# Patient Record
Sex: Female | Born: 1955 | Race: Black or African American | Hispanic: No | Smoking: Current some day smoker
Health system: Southern US, Community
[De-identification: ages and names within clinical notes are randomized; demographics above are authoritative.]

## PROBLEM LIST (undated history)

## (undated) DIAGNOSIS — Z720 Tobacco use: Secondary | ICD-10-CM

## (undated) DIAGNOSIS — Z8742 Personal history of other diseases of the female genital tract: Secondary | ICD-10-CM

## (undated) DIAGNOSIS — N840 Polyp of corpus uteri: Secondary | ICD-10-CM

## (undated) DIAGNOSIS — R51 Headache: Secondary | ICD-10-CM

## (undated) DIAGNOSIS — M1990 Unspecified osteoarthritis, unspecified site: Secondary | ICD-10-CM

## (undated) DIAGNOSIS — K5790 Diverticulosis of intestine, part unspecified, without perforation or abscess without bleeding: Secondary | ICD-10-CM

## (undated) DIAGNOSIS — R011 Cardiac murmur, unspecified: Secondary | ICD-10-CM

## (undated) DIAGNOSIS — Z8719 Personal history of other diseases of the digestive system: Secondary | ICD-10-CM

## (undated) DIAGNOSIS — R0789 Other chest pain: Secondary | ICD-10-CM

## (undated) DIAGNOSIS — D649 Anemia, unspecified: Secondary | ICD-10-CM

## (undated) DIAGNOSIS — R519 Headache, unspecified: Secondary | ICD-10-CM

## (undated) DIAGNOSIS — I10 Essential (primary) hypertension: Secondary | ICD-10-CM

## (undated) DIAGNOSIS — D259 Leiomyoma of uterus, unspecified: Secondary | ICD-10-CM

## (undated) DIAGNOSIS — K219 Gastro-esophageal reflux disease without esophagitis: Secondary | ICD-10-CM

## (undated) DIAGNOSIS — Z201 Contact with and (suspected) exposure to tuberculosis: Secondary | ICD-10-CM

## (undated) HISTORY — PX: WRIST SURGERY: SHX841

## (undated) HISTORY — PX: CARDIOVASCULAR STRESS TEST: SHX262

## (undated) HISTORY — PX: COLONOSCOPY: SHX174

## (undated) HISTORY — PX: CYSTECTOMY: SUR359

## (undated) HISTORY — PX: DILATION AND CURETTAGE OF UTERUS: SHX78

---

## 1998-02-27 ENCOUNTER — Emergency Department (HOSPITAL_COMMUNITY): Admission: EM | Admit: 1998-02-27 | Discharge: 1998-02-27 | Payer: Self-pay | Admitting: Emergency Medicine

## 1998-03-06 ENCOUNTER — Emergency Department (HOSPITAL_COMMUNITY): Admission: EM | Admit: 1998-03-06 | Discharge: 1998-03-06 | Payer: Self-pay | Admitting: Emergency Medicine

## 1998-05-22 ENCOUNTER — Emergency Department (HOSPITAL_COMMUNITY): Admission: EM | Admit: 1998-05-22 | Discharge: 1998-05-22 | Payer: Self-pay | Admitting: Emergency Medicine

## 1998-05-23 ENCOUNTER — Emergency Department (HOSPITAL_COMMUNITY): Admission: EM | Admit: 1998-05-23 | Discharge: 1998-05-23 | Payer: Self-pay | Admitting: Emergency Medicine

## 1998-07-30 ENCOUNTER — Encounter: Payer: Self-pay | Admitting: Emergency Medicine

## 1998-07-30 ENCOUNTER — Emergency Department (HOSPITAL_COMMUNITY): Admission: EM | Admit: 1998-07-30 | Discharge: 1998-07-30 | Payer: Self-pay | Admitting: Emergency Medicine

## 1999-02-17 ENCOUNTER — Emergency Department (HOSPITAL_COMMUNITY): Admission: EM | Admit: 1999-02-17 | Discharge: 1999-02-17 | Payer: Self-pay | Admitting: Emergency Medicine

## 1999-02-19 ENCOUNTER — Inpatient Hospital Stay (HOSPITAL_COMMUNITY): Admission: EM | Admit: 1999-02-19 | Discharge: 1999-02-19 | Payer: Self-pay | Admitting: Obstetrics & Gynecology

## 1999-02-19 ENCOUNTER — Ambulatory Visit (HOSPITAL_COMMUNITY): Admission: RE | Admit: 1999-02-19 | Discharge: 1999-02-19 | Payer: Self-pay | Admitting: Emergency Medicine

## 1999-02-19 ENCOUNTER — Encounter: Payer: Self-pay | Admitting: Emergency Medicine

## 1999-04-10 ENCOUNTER — Emergency Department (HOSPITAL_COMMUNITY): Admission: EM | Admit: 1999-04-10 | Discharge: 1999-04-10 | Payer: Self-pay | Admitting: Internal Medicine

## 2001-09-02 ENCOUNTER — Encounter: Payer: Self-pay | Admitting: Internal Medicine

## 2001-09-02 ENCOUNTER — Ambulatory Visit (HOSPITAL_COMMUNITY): Admission: RE | Admit: 2001-09-02 | Discharge: 2001-09-02 | Payer: Self-pay | Admitting: Internal Medicine

## 2002-06-14 ENCOUNTER — Ambulatory Visit (HOSPITAL_COMMUNITY): Admission: RE | Admit: 2002-06-14 | Discharge: 2002-06-14 | Payer: Self-pay | Admitting: Internal Medicine

## 2002-12-03 ENCOUNTER — Encounter: Payer: Self-pay | Admitting: Emergency Medicine

## 2002-12-03 ENCOUNTER — Emergency Department (HOSPITAL_COMMUNITY): Admission: EM | Admit: 2002-12-03 | Discharge: 2002-12-03 | Payer: Self-pay | Admitting: Emergency Medicine

## 2003-06-24 ENCOUNTER — Emergency Department (HOSPITAL_COMMUNITY): Admission: EM | Admit: 2003-06-24 | Discharge: 2003-06-24 | Payer: Self-pay | Admitting: Emergency Medicine

## 2004-07-03 ENCOUNTER — Ambulatory Visit: Payer: Self-pay | Admitting: Nurse Practitioner

## 2004-07-04 ENCOUNTER — Ambulatory Visit (HOSPITAL_COMMUNITY): Admission: RE | Admit: 2004-07-04 | Discharge: 2004-07-04 | Payer: Self-pay | Admitting: Internal Medicine

## 2004-07-04 ENCOUNTER — Ambulatory Visit: Payer: Self-pay | Admitting: *Deleted

## 2004-08-06 ENCOUNTER — Ambulatory Visit: Payer: Self-pay | Admitting: Nurse Practitioner

## 2005-03-25 ENCOUNTER — Inpatient Hospital Stay (HOSPITAL_COMMUNITY): Admission: AD | Admit: 2005-03-25 | Discharge: 2005-03-25 | Payer: Self-pay | Admitting: Obstetrics and Gynecology

## 2005-07-10 ENCOUNTER — Emergency Department (HOSPITAL_COMMUNITY): Admission: EM | Admit: 2005-07-10 | Discharge: 2005-07-10 | Payer: Self-pay | Admitting: Emergency Medicine

## 2005-10-29 ENCOUNTER — Emergency Department (HOSPITAL_COMMUNITY): Admission: EM | Admit: 2005-10-29 | Discharge: 2005-10-29 | Payer: Self-pay | Admitting: Emergency Medicine

## 2007-03-14 ENCOUNTER — Emergency Department (HOSPITAL_COMMUNITY): Admission: EM | Admit: 2007-03-14 | Discharge: 2007-03-14 | Payer: Self-pay | Admitting: Emergency Medicine

## 2007-05-13 ENCOUNTER — Encounter (INDEPENDENT_AMBULATORY_CARE_PROVIDER_SITE_OTHER): Payer: Self-pay | Admitting: Nurse Practitioner

## 2007-05-13 ENCOUNTER — Ambulatory Visit: Payer: Self-pay | Admitting: Internal Medicine

## 2007-05-13 LAB — CONVERTED CEMR LAB
ALT: 8 units/L (ref 0–35)
BUN: 13 mg/dL (ref 6–23)
CK-MB: 1.5 ng/mL (ref 0.3–4.0)
CO2: 22 meq/L (ref 19–32)
Calcium: 8.9 mg/dL (ref 8.4–10.5)
Chloride: 105 meq/L (ref 96–112)
Creatinine, Ser: 0.74 mg/dL (ref 0.40–1.20)
Helicobacter Pylori Antibody-IgG: <0.4
Hemoglobin: 12.1 g/dL (ref 12.0–15.0)
Lymphocytes Relative: 37 % (ref 12–46)
Monocytes Absolute: 0.6 10*3/uL (ref 0.1–1.0)
Monocytes Relative: 7 % (ref 3–12)
Neutro Abs: 4.5 10*3/uL (ref 1.7–7.7)
RBC: 3.98 M/uL (ref 3.87–5.11)
Relative Index: 1.4 (ref 0.0–2.5)
Troponin I: <0.01 ng/mL (ref <0.06)

## 2007-06-23 ENCOUNTER — Encounter (INDEPENDENT_AMBULATORY_CARE_PROVIDER_SITE_OTHER): Payer: Self-pay | Admitting: Nurse Practitioner

## 2007-06-23 ENCOUNTER — Ambulatory Visit: Payer: Self-pay | Admitting: Internal Medicine

## 2007-06-23 LAB — CONVERTED CEMR LAB
ALT: <8 units/L (ref 0–35)
AST: 10 units/L (ref 0–37)
Alkaline Phosphatase: 70 units/L (ref 39–117)
Basophils Absolute: 0 10*3/uL (ref 0.0–0.1)
Basophils Relative: 0 % (ref 0–1)
Chloride: 106 meq/L (ref 96–112)
Creatinine, Ser: 0.7 mg/dL (ref 0.40–1.20)
Eosinophils Absolute: 0.4 10*3/uL (ref 0.0–0.7)
Eosinophils Relative: 3 % (ref 0–5)
Hemoglobin: 11.8 g/dL — ABNORMAL LOW (ref 12.0–15.0)
MCHC: 33.3 g/dL (ref 30.0–36.0)
MCV: 92.2 fL (ref 78.0–100.0)
Monocytes Absolute: 0.6 10*3/uL (ref 0.1–1.0)
Neutro Abs: 6.4 10*3/uL (ref 1.7–7.7)
RDW: 15.3 % (ref 11.5–15.5)
Total Bilirubin: 0.7 mg/dL (ref 0.3–1.2)

## 2007-10-08 ENCOUNTER — Ambulatory Visit: Payer: Self-pay | Admitting: Internal Medicine

## 2007-10-15 ENCOUNTER — Encounter: Admission: RE | Admit: 2007-10-15 | Discharge: 2007-11-05 | Payer: Self-pay | Admitting: Internal Medicine

## 2008-03-07 ENCOUNTER — Emergency Department (HOSPITAL_COMMUNITY): Admission: EM | Admit: 2008-03-07 | Discharge: 2008-03-07 | Payer: Self-pay | Admitting: Emergency Medicine

## 2008-10-17 ENCOUNTER — Ambulatory Visit: Payer: Self-pay | Admitting: Internal Medicine

## 2008-10-21 ENCOUNTER — Ambulatory Visit (HOSPITAL_COMMUNITY): Admission: RE | Admit: 2008-10-21 | Discharge: 2008-10-21 | Payer: Self-pay | Admitting: Family Medicine

## 2009-04-07 ENCOUNTER — Emergency Department (HOSPITAL_COMMUNITY): Admission: EM | Admit: 2009-04-07 | Discharge: 2009-04-07 | Payer: Self-pay | Admitting: Emergency Medicine

## 2009-04-14 ENCOUNTER — Ambulatory Visit: Payer: Self-pay | Admitting: Internal Medicine

## 2009-07-30 ENCOUNTER — Emergency Department (HOSPITAL_COMMUNITY): Admission: EM | Admit: 2009-07-30 | Discharge: 2009-07-30 | Payer: Self-pay | Admitting: Emergency Medicine

## 2009-09-27 ENCOUNTER — Ambulatory Visit: Payer: Self-pay | Admitting: Internal Medicine

## 2010-03-22 ENCOUNTER — Emergency Department (HOSPITAL_COMMUNITY)
Admission: EM | Admit: 2010-03-22 | Discharge: 2010-03-22 | Payer: Self-pay | Source: Home / Self Care | Admitting: Emergency Medicine

## 2011-01-27 ENCOUNTER — Emergency Department (HOSPITAL_COMMUNITY)
Admission: EM | Admit: 2011-01-27 | Discharge: 2011-01-27 | Disposition: A | Discharge status: Home / Self Care | Payer: Self-pay | Attending: Emergency Medicine | Admitting: Emergency Medicine

## 2011-01-27 ENCOUNTER — Emergency Department (HOSPITAL_COMMUNITY): Payer: Self-pay

## 2011-01-27 DIAGNOSIS — R059 Cough, unspecified: Secondary | ICD-10-CM | POA: Insufficient documentation

## 2011-01-27 DIAGNOSIS — IMO0001 Reserved for inherently not codable concepts without codable children: Secondary | ICD-10-CM | POA: Insufficient documentation

## 2011-01-27 DIAGNOSIS — R062 Wheezing: Secondary | ICD-10-CM | POA: Insufficient documentation

## 2011-01-27 DIAGNOSIS — J40 Bronchitis, not specified as acute or chronic: Secondary | ICD-10-CM | POA: Insufficient documentation

## 2011-01-27 DIAGNOSIS — R509 Fever, unspecified: Secondary | ICD-10-CM | POA: Insufficient documentation

## 2011-01-27 DIAGNOSIS — R05 Cough: Secondary | ICD-10-CM | POA: Insufficient documentation

## 2011-01-27 HISTORY — DX: Essential (primary) hypertension: I10

## 2011-01-27 MED ORDER — HYDROCOD POLST-CHLORPHEN POLST 10-8 MG/5ML PO LQCR: 5.0000 mL | Freq: Two times a day (BID) | ORAL | Status: DC

## 2011-01-27 MED ORDER — PREDNISONE 20 MG PO TABS: 40.0000 mg | ORAL_TABLET | Freq: Every day | ORAL | Status: AC

## 2011-01-27 MED ORDER — IPRATROPIUM BROMIDE 0.02 % IN SOLN
0.5000 mg | Freq: Once | RESPIRATORY_TRACT | Status: AC
  Administered 2011-01-27: 0.5 mg via RESPIRATORY_TRACT
  Filled 2011-01-27: qty 2.5

## 2011-01-27 MED ORDER — ALBUTEROL SULFATE (5 MG/ML) 0.5% IN NEBU
2.5000 mg | INHALATION_SOLUTION | Freq: Once | RESPIRATORY_TRACT | Status: AC
  Administered 2011-01-27: 5 mg via RESPIRATORY_TRACT
  Filled 2011-01-27: qty 1

## 2011-01-27 MED ORDER — IBUPROFEN 800 MG PO TABS
800.0000 mg | ORAL_TABLET | Freq: Once | ORAL | Status: AC
  Administered 2011-01-27: 800 mg via ORAL
  Filled 2011-01-27: qty 1

## 2011-01-27 MED ORDER — ALBUTEROL SULFATE HFA 108 (90 BASE) MCG/ACT IN AERS
2.0000 | INHALATION_SPRAY | Freq: Once | RESPIRATORY_TRACT | Status: AC
  Administered 2011-01-27: 2 via RESPIRATORY_TRACT
  Filled 2011-01-27: qty 6.7

## 2011-01-27 NOTE — ED Provider Notes (Signed)
History     CSN: [id number] Arrival date & time: 01/27/2011  2:59 PM   First MD Initiated Contact with Patient 01/27/11 1726      Chief Complaint  Patient presents with  . URI    pt in with productive cough and fever x1 week states at times noted blood in sputum pt states no relief with otc medication    (Consider location/radiation/quality/duration/timing/severity/associated sxs/prior treatment) Patient is a 55 y.o. female presenting with URI. The history is provided by the patient.  URI The primary symptoms include fever, sore throat, cough, wheezing and myalgias. Primary symptoms do not include ear pain or rash. The current episode started more than 1 week ago. This is a chronic problem. The problem has not changed since onset. Symptoms associated with the illness include chills, sinus pressure and congestion. The illness is not associated with facial pain. The following treatments were addressed: Acetaminophen was ineffective. A decongestant was ineffective.  Pt reports flu like symptoms for a week. States now cough worsening, feels like last time when she had pneumonia. States fever up to 102, only at bedtime.   Past Medical History  Diagnosis Date  . Hypertension     History reviewed. No pertinent past surgical history.  History reviewed. No pertinent family history.  History  Substance Use Topics  . Smoking status: Current Everyday Smoker  . Smokeless tobacco: Not on file  . Alcohol Use: Yes    OB History    Grav Para Term Preterm Abortions TAB SAB Ect Mult Living                  Review of Systems  Constitutional: Positive for fever and chills.  HENT: Positive for congestion, sore throat and sinus pressure. Negative for ear pain, neck pain and ear discharge.   Eyes: Negative.   Respiratory: Positive for cough and wheezing. Negative for chest tightness.   Cardiovascular: Negative for chest pain.  Gastrointestinal: Negative.   Genitourinary: Negative.     Musculoskeletal: Positive for myalgias.  Skin: Negative for rash.  Neurological: Negative.   Hematological: Negative.     Allergies  Review of patient's allergies indicates no known allergies.  Home Medications  No current outpatient prescriptions on file.  BP 125/81  Pulse 88  Temp(Src) 98.5 F (36.9 C) (Oral)  Resp 18  SpO2 99%  Physical Exam  Constitutional: She is oriented to person, place, and time. She appears well-developed and well-nourished. No distress.  HENT:  Head: Normocephalic.  Right Ear: Tympanic membrane, external ear and ear canal normal.  Left Ear: Tympanic membrane, external ear and ear canal normal.  Nose: Rhinorrhea present.  Mouth/Throat: Uvula is midline, oropharynx is clear and moist and mucous membranes are normal. No uvula swelling.  Cardiovascular: Normal rate, regular rhythm and normal heart sounds.   Pulmonary/Chest: Effort normal and breath sounds normal.       Expiratory wheezing in all lung fields  Abdominal: Soft. Bowel sounds are normal. There is no tenderness.  Musculoskeletal: Normal range of motion.  Neurological: She is alert and oriented to person, place, and time. She has normal reflexes.  Skin: Skin is warm and dry.    ED Course  Procedures (including critical care time) Dg Chest 2 View  01/27/2011  *RADIOLOGY REPORT*  Clinical Data: Fever.  Cough.  CHEST - 2 VIEW  Comparison: 04/07/2009  Findings: Heart size and vascularity are normal and the lungs are clear.  No osseous abnormality.  IMPRESSION: Normal chest.  Original Report  Authenticated By: Gwynn Burly, M.D.     Negative CXR. Pt received neb in ED, feels better. Will start on prednisone pack, inhaler proided, cough medications give. Pt's oxygen sat 99% on RA. Afebrile. Non toxic. Will d/c home.  MDM          Lottie Mussel, PA 01/27/11 2010

## 2011-01-28 NOTE — ED Provider Notes (Signed)
Medical screening examination/treatment/procedure(s) were performed by non-physician practitioner and as supervising physician I was immediately available for consultation/collaboration.   Tolulope Pinkett, MD 01/28/11 0038 

## 2011-05-09 ENCOUNTER — Other Ambulatory Visit: Payer: Self-pay | Admitting: Obstetrics and Gynecology

## 2011-05-09 DIAGNOSIS — Z1231 Encounter for screening mammogram for malignant neoplasm of breast: Secondary | ICD-10-CM

## 2011-05-14 ENCOUNTER — Other Ambulatory Visit: Payer: Self-pay | Admitting: Obstetrics and Gynecology

## 2011-05-14 ENCOUNTER — Ambulatory Visit (INDEPENDENT_AMBULATORY_CARE_PROVIDER_SITE_OTHER): Payer: Self-pay | Admitting: *Deleted

## 2011-05-14 ENCOUNTER — Ambulatory Visit (HOSPITAL_COMMUNITY): Payer: Self-pay | Attending: Obstetrics and Gynecology

## 2011-05-14 VITALS — BP 136/94 | HR 91 | Temp 98.6°F | Ht 62.0 in | Wt 129.7 lb

## 2011-05-14 DIAGNOSIS — Z01419 Encounter for gynecological examination (general) (routine) without abnormal findings: Secondary | ICD-10-CM

## 2011-05-14 DIAGNOSIS — R2230 Localized swelling, mass and lump, unspecified upper limb: Secondary | ICD-10-CM

## 2011-05-14 DIAGNOSIS — N6452 Nipple discharge: Secondary | ICD-10-CM

## 2011-05-14 NOTE — Patient Instructions (Signed)
Taught patient how to perform BSE and gave educational materials to take home. Let her know [facility] will cover Pap smears every 3 years unless has a history of abnormal Pap smears. Patient is scheduled for a diagnostic mammogram Wednesday, May 22, 2011 at 1320. Patient aware of appointment and will be there. Let patient know will follow up with her within the next couple weeks with results. Patient verbalized understanding.

## 2011-05-14 NOTE — Progress Notes (Signed)
Complaints of occasional left nipple discharge and an increase in size of left nipple.  Pap Smear:    Pap smear completed today. Patients last Pap smear was 06/23/2007 and was normal. Per patient no history of abnormal Pap smears. Pap smear result above is in EPIC.  Physical exam: Breasts Breasts symmetrical. No skin abnormalities bilateral breasts. Bilateral Axilla scabbed lesions observed. No nipple retraction bilateral breasts. No nipple discharge right breast. Per patient occasional left nipple discharge that is clear. Unable to express any discharge from bilateral breasts on exam. Per patient left nipple has increased in size and was concerned with change. Left nipple appeared swollen and larger than right nipple. No lymphadenopathy left axilla. Firm lump in center of right axilla. No lumps palpated bilateral breasts. No complaints of pain or tenderness on palpation. Referred patient to the [facility] for diagnostic mammogram. Appointment scheduled for Wednesday, May 22, 2011 at 1320.        Pelvic/Bimanual   Ext Genitalia No lesions, no swelling and no discharge observed on external genitalia.         Vagina Vagina pink and normal texture. No lesions or discharge observed in vagina.          Cervix Cervix is present. Cervix pink and of normal texture. No discharge observed.     Uterus Uterus is present and palpable. Uterus in normal position and normal size.        Adnexae Bilateral ovaries present and palpable. No tenderness on palpation.          Rectovaginal No rectal exam completed today since patient had no rectal complaints. No skin abnormalities observed on exam.

## 2011-05-22 ENCOUNTER — Ambulatory Visit
Admission: RE | Admit: 2011-05-22 | Discharge: 2011-05-22 | Disposition: A | Discharge status: Home / Self Care | Payer: No Typology Code available for payment source | Source: Ambulatory Visit | Attending: Obstetrics and Gynecology | Admitting: Obstetrics and Gynecology

## 2011-05-22 ENCOUNTER — Other Ambulatory Visit: Payer: Self-pay | Admitting: Obstetrics and Gynecology

## 2011-05-22 DIAGNOSIS — R2230 Localized swelling, mass and lump, unspecified upper limb: Secondary | ICD-10-CM

## 2011-05-22 DIAGNOSIS — N6452 Nipple discharge: Secondary | ICD-10-CM

## 2011-05-24 ENCOUNTER — Telehealth: Payer: Self-pay | Admitting: *Deleted

## 2011-05-24 NOTE — Telephone Encounter (Signed)
Telephoned patient at home # and advised of pap smear results ASCUS -HPV and would need repeat pap in one year. Also advised of abnormal diagnostic mammogram and would need breast biopsy. Pt was aware of appt scheduled on March 12 at 1:20

## 2011-05-28 ENCOUNTER — Other Ambulatory Visit: Payer: Self-pay | Admitting: Obstetrics and Gynecology

## 2011-05-28 ENCOUNTER — Ambulatory Visit
Admission: RE | Admit: 2011-05-28 | Discharge: 2011-05-28 | Disposition: A | Discharge status: Home / Self Care | Payer: No Typology Code available for payment source | Source: Ambulatory Visit | Attending: Obstetrics and Gynecology | Admitting: Obstetrics and Gynecology

## 2011-05-28 DIAGNOSIS — Z1231 Encounter for screening mammogram for malignant neoplasm of breast: Secondary | ICD-10-CM

## 2011-05-28 DIAGNOSIS — R2230 Localized swelling, mass and lump, unspecified upper limb: Secondary | ICD-10-CM

## 2011-05-28 DIAGNOSIS — N6452 Nipple discharge: Secondary | ICD-10-CM

## 2012-08-19 ENCOUNTER — Emergency Department (HOSPITAL_COMMUNITY)
Admission: EM | Admit: 2012-08-19 | Discharge: 2012-08-19 | Disposition: A | Discharge status: Home / Self Care | Payer: BC Managed Care – PPO | Attending: Emergency Medicine | Admitting: Emergency Medicine

## 2012-08-19 ENCOUNTER — Encounter (HOSPITAL_COMMUNITY): Payer: Self-pay | Admitting: Emergency Medicine

## 2012-08-19 ENCOUNTER — Emergency Department (HOSPITAL_COMMUNITY): Payer: BC Managed Care – PPO

## 2012-08-19 DIAGNOSIS — M549 Dorsalgia, unspecified: Secondary | ICD-10-CM

## 2012-08-19 DIAGNOSIS — Z79899 Other long term (current) drug therapy: Secondary | ICD-10-CM | POA: Insufficient documentation

## 2012-08-19 DIAGNOSIS — M545 Low back pain, unspecified: Secondary | ICD-10-CM | POA: Insufficient documentation

## 2012-08-19 DIAGNOSIS — F17200 Nicotine dependence, unspecified, uncomplicated: Secondary | ICD-10-CM | POA: Insufficient documentation

## 2012-08-19 DIAGNOSIS — I10 Essential (primary) hypertension: Secondary | ICD-10-CM | POA: Insufficient documentation

## 2012-08-19 DIAGNOSIS — E876 Hypokalemia: Secondary | ICD-10-CM | POA: Insufficient documentation

## 2012-08-19 DIAGNOSIS — Y9389 Activity, other specified: Secondary | ICD-10-CM | POA: Insufficient documentation

## 2012-08-19 DIAGNOSIS — X503XXA Overexertion from repetitive movements, initial encounter: Secondary | ICD-10-CM | POA: Insufficient documentation

## 2012-08-19 DIAGNOSIS — Y929 Unspecified place or not applicable: Secondary | ICD-10-CM | POA: Insufficient documentation

## 2012-08-19 MED ORDER — METHOCARBAMOL 500 MG PO TABS: 1000.0000 mg | ORAL_TABLET | Freq: Four times a day (QID) | ORAL | Status: DC

## 2012-08-19 MED ORDER — HYDROCODONE-ACETAMINOPHEN 5-325 MG PO TABS: ORAL_TABLET | ORAL | Status: DC

## 2012-08-19 MED ORDER — IBUPROFEN 600 MG PO TABS: 600.0000 mg | ORAL_TABLET | Freq: Four times a day (QID) | ORAL | Status: DC | PRN

## 2012-08-19 MED ORDER — HYDROCODONE-ACETAMINOPHEN 5-325 MG PO TABS
2.0000 | ORAL_TABLET | Freq: Once | ORAL | Status: AC
  Administered 2012-08-19: 2 via ORAL
  Filled 2012-08-19: qty 2

## 2012-08-19 MED ORDER — HYDROCODONE-ACETAMINOPHEN 5-325 MG PO TABS: 1.0000 | ORAL_TABLET | Freq: Once | ORAL | Status: DC

## 2012-08-19 NOTE — ED Notes (Signed)
Pt states her daughter will give her a ride home.

## 2012-08-19 NOTE — ED Notes (Signed)
Pt states she has problems with her knees and she was carrying her groceries and her knee gave out and she fell down to her knees   Pt is now c/o lower back pain she states started yesterday when she got up

## 2012-08-19 NOTE — ED Notes (Signed)
Patient transported to X-ray 

## 2012-08-19 NOTE — ED Provider Notes (Signed)
History    This chart was scribed for non-physician practitioner Rhea Bleacher working with Celene Kras, MD by Quintella Reichert, ED scribe.  This patient was seen in room WTR6/WTR6 and the patient's care was started at 7:32 PM.   CSN: [id number]  Arrival date & time 08/19/12  1915      Chief Complaint  Patient presents with  . Back Pain     The history is provided by the patient. No language interpreter was used.    HPI Comments: Charlotte Garcia is a 57 y.o. female who presents to the Emergency Department complaining of constant, moderate-to-severe, progressively-worsening right lumbar back pain that began 3 nights ago.  Pt states that she tried to carry all of her groceries at once the day before pain began and subsequently developed moderate pain that night.  The pain became much more severe the following morning after waking when she attempted to stand up after getting out of bed.  She describes pain as "deep down" and states that it radiates to right lower flank, right hip and right posterior upper leg, and it is greatly worsened by bearing weight.  She denies urinary or bowel symptoms, fever, unexpected weight loss, or any other associated symptoms.  Pt has h/o back pain and states she was treated in the past with pain medicine and anti-inflammatory medication.  She notes that she has had imaging done of her back and was informed that she has fractured vertebrae in the lumbar region.  She notes her last x-ray was taken over a year ago.  Pt is allergic to Sulfa and codeine. .No red flag s/s of low back pain.    Past Medical History  Diagnosis Date  . Hypertension   . Hypokalemia     Past Surgical History  Procedure Laterality Date  . Wrist surgery      gangular cyst  . Cystectomy      forehead; back of head  . Dilation and curettage of uterus      Family History  Problem Relation Age of Onset  . Hypertension Father   . Heart disease Father   . Hypertension Mother   .  Heart disease Mother     History  Substance Use Topics  . Smoking status: Current Every Day Smoker  . Smokeless tobacco: Never Used  . Alcohol Use: Yes    OB History   Grav Para Term Preterm Abortions TAB SAB Ect Mult Living   3 1 1  2  2   1       Review of Systems  Constitutional: Negative for fever and unexpected weight change.  Gastrointestinal: Negative for diarrhea and constipation.       Negative for fecal incontinence.   Genitourinary: Negative for dysuria, hematuria, flank pain, vaginal bleeding, vaginal discharge, difficulty urinating and pelvic pain.       Negative for urinary incontinence or retention.  Musculoskeletal: Positive for back pain.  Neurological: Negative for weakness and numbness.       Denies saddle paresthesias.    Allergies  Sulfa antibiotics and Other  Home Medications   Current Outpatient Rx  Name  Route  Sig  Dispense  Refill  . chlorpheniramine-HYDROcodone (TUSSIONEX PENNKINETIC ER) 10-8 MG/5ML LQCR   Oral   Take 5 mLs by mouth every 12 (twelve) hours.   140 mL   0   . fexofenadine (ALLEGRA) 30 MG tablet   Oral   Take 30 mg by mouth 2 (two) times daily.           Marland Kitchen  nabumetone (RELAFEN) 500 MG tablet   Oral   Take 750 mg by mouth 2 (two) times daily.           . potassium chloride (KLOR-CON) 20 MEQ packet   Oral   Take 20 mEq by mouth 2 (two) times daily.           Marland Kitchen PRESCRIPTION MEDICATION      BP medication pt unsure what the name is           BP 132/96  Pulse 85  Temp(Src) 99 F (37.2 C) (Oral)  Resp 18  SpO2 96%  Physical Exam  Nursing note and vitals reviewed. Constitutional: She appears well-developed and well-nourished. No distress.  HENT:  Head: Normocephalic and atraumatic.  Eyes: Conjunctivae and EOM are normal.  Neck: Normal range of motion. Neck supple. No tracheal deviation present.  Cardiovascular: Normal rate.   Pulmonary/Chest: Effort normal. No respiratory distress.  Abdominal: Soft. Bowel  sounds are normal. She exhibits no distension. There is no tenderness. There is no rebound, no guarding and no CVA tenderness.  Musculoskeletal: Normal range of motion. She exhibits tenderness (Right thoracic and lumbar paraspinal tenderness, no midline tenderness).       Thoracic back: She exhibits tenderness. She exhibits no bony tenderness.       Lumbar back: She exhibits tenderness. She exhibits no bony tenderness.       Back:  No step-off noted with palpation of spine.   Neurological: She is alert. She has normal strength and normal reflexes. No sensory deficit.  Mildly decreased strength of flexors and extensors of right lower extremity  Skin: Skin is warm and dry. No rash noted.  Psychiatric: She has a normal mood and affect. Her behavior is normal.    ED Course  Procedures (including critical care time)  DIAGNOSTIC STUDIES: Oxygen Saturation is 96% on room air, normal by my interpretation.    COORDINATION OF CARE: 7:38 PM-Discussed treatment plan which includes pain medication and imaging with pt at bedside and pt agreed to plan.      Labs Reviewed - No data to display Dg Lumbar Spine Complete  08/19/2012   *RADIOLOGY REPORT*  Clinical Data: Worsening low back pain.  No known injury.  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  Disc spaces are maintained.  No acute fracture.  No subluxation or dislocation.  SI joints are symmetric and unremarkable.  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  No acute findings.   Original Report Authenticated By: Charlett Nose, M.D.     1. Back pain    Patient seen and examined. Work-up initiated. Medications ordered.   Vital signs reviewed and are as follows: Filed Vitals:   08/19/12 2043  BP: 137/94  Pulse: 79  Temp:   Resp: 16   X-ray results reviewed. Pt informed. Neurosurg referral given.   No red flag s/s of low  back pain. Patient was counseled on back pain precautions and told to do activity as tolerated but do not lift, push, or pull heavy objects more than 10 pounds for the next week.  Patient counseled to use ice or heat on back for no longer than 15 minutes every hour.   Patient prescribed muscle relaxer and counseled on proper use of muscle relaxant medication.    Patient prescribed narcotic pain medicine and counseled on proper use of narcotic pain medications. Counseled  not to combine this medication with others containing tylenol.   Urged patient not to drink alcohol, drive, or perform any other activities that requires focus while taking either of these medications.  Patient urged to follow-up with PCP if pain does not improve with treatment and rest or if pain becomes recurrent. Urged to return with worsening severe pain, loss of bowel or bladder control, trouble walking.   The patient verbalizes understanding and agrees with the plan.   MDM  Patient with back pain. X-ray performed 2/2 age. Degenerative disease noted.  No neurological deficits. Patient is ambulatory. No warning symptoms of back pain including: loss of bowel or bladder control, night sweats, waking from sleep with back pain, unexplained fevers or weight loss, h/o cancer, IVDU, recent trauma. No concern for cauda equina, epidural abscess, or other serious cause of back pain. Conservative measures such as rest, ice/heat and pain medicine indicated with PCP follow-up if no improvement with conservative management.    I personally performed the services described in this documentation, which was scribed in my presence. The recorded information has been reviewed and is accurate.    Renne Crigler, PA-C 08/19/12 2212

## 2012-08-22 NOTE — ED Provider Notes (Signed)
Medical screening examination/treatment/procedure(s) were performed by non-physician practitioner and as supervising physician I was immediately available for consultation/collaboration.    Janiel Crisostomo R Keatyn Jawad, MD 08/22/12 1512 

## 2012-09-11 ENCOUNTER — Emergency Department (HOSPITAL_COMMUNITY)
Admission: EM | Admit: 2012-09-11 | Discharge: 2012-09-11 | Disposition: A | Discharge status: Home / Self Care | Payer: BC Managed Care – PPO | Attending: Emergency Medicine | Admitting: Emergency Medicine

## 2012-09-11 ENCOUNTER — Emergency Department (HOSPITAL_COMMUNITY): Payer: BC Managed Care – PPO

## 2012-09-11 DIAGNOSIS — M25461 Effusion, right knee: Secondary | ICD-10-CM

## 2012-09-11 DIAGNOSIS — R05 Cough: Secondary | ICD-10-CM | POA: Insufficient documentation

## 2012-09-11 DIAGNOSIS — Z8639 Personal history of other endocrine, nutritional and metabolic disease: Secondary | ICD-10-CM | POA: Insufficient documentation

## 2012-09-11 DIAGNOSIS — J069 Acute upper respiratory infection, unspecified: Secondary | ICD-10-CM

## 2012-09-11 DIAGNOSIS — Z862 Personal history of diseases of the blood and blood-forming organs and certain disorders involving the immune mechanism: Secondary | ICD-10-CM | POA: Insufficient documentation

## 2012-09-11 DIAGNOSIS — I10 Essential (primary) hypertension: Secondary | ICD-10-CM | POA: Insufficient documentation

## 2012-09-11 DIAGNOSIS — R059 Cough, unspecified: Secondary | ICD-10-CM | POA: Insufficient documentation

## 2012-09-11 DIAGNOSIS — Z79899 Other long term (current) drug therapy: Secondary | ICD-10-CM | POA: Insufficient documentation

## 2012-09-11 DIAGNOSIS — F17200 Nicotine dependence, unspecified, uncomplicated: Secondary | ICD-10-CM | POA: Insufficient documentation

## 2012-09-11 DIAGNOSIS — Z7982 Long term (current) use of aspirin: Secondary | ICD-10-CM | POA: Insufficient documentation

## 2012-09-11 DIAGNOSIS — M25469 Effusion, unspecified knee: Secondary | ICD-10-CM | POA: Insufficient documentation

## 2012-09-11 DIAGNOSIS — M545 Low back pain, unspecified: Secondary | ICD-10-CM | POA: Insufficient documentation

## 2012-09-11 MED ORDER — METHOCARBAMOL 750 MG PO TABS: 750.0000 mg | ORAL_TABLET | Freq: Four times a day (QID) | ORAL | Status: DC | PRN

## 2012-09-11 MED ORDER — DIAZEPAM 5 MG PO TABS
10.0000 mg | ORAL_TABLET | Freq: Once | ORAL | Status: AC
  Administered 2012-09-11: 10 mg via ORAL
  Filled 2012-09-11 (×2): qty 1

## 2012-09-11 MED ORDER — OXYCODONE-ACETAMINOPHEN 5-325 MG PO TABS
2.0000 | ORAL_TABLET | Freq: Once | ORAL | Status: AC
  Administered 2012-09-11: 2 via ORAL
  Filled 2012-09-11: qty 2

## 2012-09-11 MED ORDER — NAPROXEN 500 MG PO TABS: 500.0000 mg | ORAL_TABLET | Freq: Two times a day (BID) | ORAL | Status: DC | PRN

## 2012-09-11 MED ORDER — OXYCODONE-ACETAMINOPHEN 5-325 MG PO TABS: 1.0000 | ORAL_TABLET | ORAL | Status: DC | PRN

## 2012-09-11 NOTE — ED Provider Notes (Signed)
History  This chart was scribed for Charlotte Garcia- PA by Manuela Schwartz, ED scribe. This patient was seen in room WTR5/WTR5 and the patient's care was started at 1926.  CSN: [id number] Arrival date & time 09/11/12  1820  First MD Initiated Contact with Patient 09/11/12 1926     Chief Complaint  Patient presents with  . Knee Pain  . Cough   Patient is a 57 y.o. female presenting with knee pain and cough. The history is provided by the patient. No language interpreter was used.  Knee Pain Location:  Knee Time since incident:  1 day Injury: no   Knee location:  L knee Pain details:    Quality:  Aching and throbbing   Radiates to:  Does not radiate   Severity:  Moderate   Onset quality:  Gradual   Duration:  1 day   Timing:  Constant   Progression:  Unchanged Chronicity:  Recurrent Dislocation: no   Foreign body present:  No foreign bodies Relieved by:  Nothing Worsened by:  Nothing tried Ineffective treatments:  None tried Associated symptoms: back pain   Associated symptoms: no fatigue and no fever   Cough Associated symptoms: no chest pain, no diaphoresis, no fever, no headaches, no rash, no shortness of breath and no wheezing    HPI Comments: Charlotte Garcia is a 57 y.o. female who presents to the Emergency Department w/hx of back pain complaining of increase in swelling/pain of her left knee from this AM which is a recurrent problem for her but she states today was much more swollen and painful. She states difficulty with ambulation secondary to her knee pain throughout today and uses a cane to ambulate. She attributes her knee pain to not having any cartilage left in her knee. She denies any recent falls or trauma to her knee. She has an appointment setup on July 9th with an orthopedist.   She states back pain today is sharp and worse than her baseline back pain. States she was evaluated for this pain several weeks ago on the right and she is now in the same pain on the  left. She endorses radiation to the left lower back, left buttock and left posterior thigh. Walking makes the pain worse. She denies incontinence. She denies urinary or bowel symptoms, fever, unexpected weight loss, or any other associated symptoms. Pt has h/o back pain and states she was treated in the past with pain medicine and anti-inflammatory medication.    She also c/o dry cough for the past week and is worried that it might be whooping cough.  She endorses sick contacts was 2 weeks ago. She denies rhinorrhea, nasal congestion, sore throat, shortness of breath, chest pain.  She endorses a concern for whooping cough however states she is up to date on her vaccinations. Also denies fever and chills.  Past Medical History  Diagnosis Date  . Hypertension   . Hypokalemia    Past Surgical History  Procedure Laterality Date  . Wrist surgery      gangular cyst  . Cystectomy      forehead; back of head  . Dilation and curettage of uterus     Family History  Problem Relation Age of Onset  . Hypertension Father   . Heart disease Father   . Hypertension Mother   . Heart disease Mother    History  Substance Use Topics  . Smoking status: Current Every Day Smoker  . Smokeless tobacco: Never Used  .  Alcohol Use: Yes   OB History   Grav Para Term Preterm Abortions TAB SAB Ect Mult Living   3 1 1  2  2   1      Review of Systems  Constitutional: Negative for fever, diaphoresis, appetite change, fatigue and unexpected weight change.  HENT: Negative for mouth sores and neck stiffness.   Eyes: Negative for visual disturbance.  Respiratory: Positive for cough (dry cough). Negative for chest tightness, shortness of breath and wheezing.   Cardiovascular: Negative for chest pain.  Gastrointestinal: Negative for nausea, vomiting, abdominal pain, diarrhea and constipation.  Endocrine: Negative for polydipsia, polyphagia and polyuria.  Genitourinary: Negative for dysuria, urgency, frequency and  hematuria.  Musculoskeletal: Positive for back pain, joint swelling (left knee pain/swelling ) and gait problem (2/2 pain).  Skin: Negative for rash.  Allergic/Immunologic: Negative for immunocompromised state.  Neurological: Negative for syncope, light-headedness and headaches.  Hematological: Does not bruise/bleed easily.  Psychiatric/Behavioral: Negative for sleep disturbance. The patient is not nervous/anxious.   All other systems reviewed and are negative.   A complete 10 system review of systems was obtained and all systems are negative except as noted in the HPI and PMH.   Allergies  Sulfa antibiotics and Other  Home Medications   Current Outpatient Rx  Name  Route  Sig  Dispense  Refill  . aspirin 81 MG chewable tablet   Oral   Chew 81 mg by mouth daily.         Marland Kitchen HYDROcodone-acetaminophen (NORCO/VICODIN) 5-325 MG per tablet      Take 1-2 tablets every 6 hours as needed for severe pain   12 tablet   0   . ibuprofen (ADVIL,MOTRIN) 600 MG tablet   Oral   Take 1 tablet (600 mg total) by mouth every 6 (six) hours as needed for pain.   20 tablet   0   . methocarbamol (ROBAXIN) 500 MG tablet   Oral   Take 2 tablets (1,000 mg total) by mouth 4 (four) times daily.   20 tablet   0   . POTASSIUM PO   Oral   Take 1 tablet by mouth daily.         . vitamin E 200 UNIT capsule   Oral   Take 200 Units by mouth daily.         . methocarbamol (ROBAXIN) 750 MG tablet   Oral   Take 1 tablet (750 mg total) by mouth 4 (four) times daily as needed (Take 1 tablet every 6 hours as needed for muscle spasms.).   20 tablet   0   . naproxen (NAPROSYN) 500 MG tablet   Oral   Take 1 tablet (500 mg total) by mouth 2 (two) times daily as needed.   30 tablet   0   . oxyCODONE-acetaminophen (PERCOCET) 5-325 MG per tablet   Oral   Take 1 tablet by mouth every 4 (four) hours as needed for pain (Take 1- 2 tablets every 4 - 6 hours as needed for pain.).   20 tablet   0     Triage Vitals: BP 132/92  Pulse 88  Temp(Src) 98.7 F (37.1 C) (Oral)  Resp 20  SpO2 100% Physical Exam  Nursing note and vitals reviewed. Constitutional: She is oriented to person, place, and time. She appears well-developed and well-nourished. No distress.  HENT:  Head: Normocephalic and atraumatic.  Right Ear: Tympanic membrane, external ear and ear canal normal.  Left Ear: Tympanic membrane, external  ear and ear canal normal.  Nose: No mucosal edema or rhinorrhea. No epistaxis. Right sinus exhibits no maxillary sinus tenderness and no frontal sinus tenderness. Left sinus exhibits no maxillary sinus tenderness and no frontal sinus tenderness.  Mouth/Throat: Uvula is midline, oropharynx is clear and moist and mucous membranes are normal. Mucous membranes are not pale and not cyanotic. No oropharyngeal exudate, posterior oropharyngeal edema, posterior oropharyngeal erythema or tonsillar abscesses.  Eyes: Conjunctivae are normal. Pupils are equal, round, and reactive to light.  Neck: Normal range of motion and full passive range of motion without pain. Neck supple.  Full ROM without pain  Cardiovascular: Normal rate, regular rhythm, normal heart sounds and intact distal pulses.   No murmur heard. Pulses:      Radial pulses are 2+ on the right side, and 2+ on the left side.       Dorsalis pedis pulses are 2+ on the right side, and 2+ on the left side.       Posterior tibial pulses are 2+ on the right side, and 2+ on the left side.  Capillary refill < 3  Pulmonary/Chest: Effort normal and breath sounds normal. No stridor. No respiratory distress. She has no wheezes.  Abdominal: Soft. Bowel sounds are normal. She exhibits no distension. There is no tenderness.  Musculoskeletal: She exhibits tenderness. She exhibits no edema.       Left knee: She exhibits decreased range of motion, swelling and effusion. She exhibits no ecchymosis, no deformity, no laceration, no erythema, normal  alignment and no bony tenderness. No tenderness found. No medial joint line and no lateral joint line tenderness noted.       Lumbar back: She exhibits tenderness, pain and spasm. She exhibits normal range of motion, no swelling, no edema and no deformity.       Back:  Tenderness palpation of the left paraspinal muscles Full range of motion of the T-spine and L-spine No midline tenderness Tenderness to palpation of the left knee   Lymphadenopathy:    She has no cervical adenopathy.  Neurological: She is alert and oriented to person, place, and time. She has normal reflexes. Coordination normal.  Speech is clear and goal oriented, follows commands Normal strength in upper and lower extremities bilaterally including dorsiflexion and plantar flexion, strong and equal grip strength Sensation normal to light and sharp touch Moves extremities without ataxia, coordination intact Normal gait Normal balance   Skin: Skin is warm and dry. No rash noted. She is not diaphoretic. No erythema.  No tenting of the skin  Psychiatric: She has a normal mood and affect.    ED Course  Procedures (including critical care time) DIAGNOSTIC STUDIES: Oxygen Saturation is 100% on room air, normal by my interpretation.    COORDINATION OF CARE: At 725 PM Discussed treatment plan with patient which includes left knee X-ray, CXR, knee sleeve, pain medicine. Patient agrees.   Labs Reviewed - No data to display Dg Chest 2 View  09/11/2012   *RADIOLOGY REPORT*  Clinical Data: Cough, history of bronchitis  CHEST - 2 VIEW  Comparison: 01/27/2011; 04/07/2009  Findings:  Grossly unchanged cardiac silhouette and mediastinal contours with mild tortuosity of the thoracic aorta.  No focal airspace opacities.  No pleural effusion or pneumothorax.  No evidence of edema.  Unchanged bones.  IMPRESSION: No acute cardiopulmonary disease.   Original Report Authenticated By: Tacey Ruiz, MD   Dg Knee Complete 4 Views  Left  09/11/2012   *RADIOLOGY REPORT*  Clinical Data: Knee pain  LEFT KNEE - COMPLETE 4+ VIEW  Comparison: Left knee MRI - 07/04/2004  Findings:  No fracture or dislocation.  No definite suprapatellar joint effusion.  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  No evidence of chondrocalcinosis.  Regional soft tissues are normal. No radiopaque foreign body.  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   Original Report Authenticated By: Tacey Ruiz, MD   1. Viral URI with cough   2. Knee effusion, right   3. Low back pain     MDM  Pearline Cables presents with back pain.  Patient with back pain.  No neurological deficits and normal neuro exam.  Patient can walk but states is painful.  No loss of bowel or bladder control.  No concern for cauda equina.  No fever, night sweats, weight loss, h/o cancer, IVDU.  Record review shows that patient was evaluated on 08/19/2012. She was evaluated at that time with an x-ray of her L. spine which showed degenerative facet disease in the lower lumbar spine but no acute findings.  I personally reviewed the imaging tests through PACS system. I reviewed available ER/hospitalization records through the EMR.  Patient has had no falls or trauma therefore do not feel that new imaging is needed tonight.  RICE protocol and pain medicine indicated and discussed with patient.   Pt CXR negative for acute infiltrate. Patients symptoms are consistent with URI, likely viral etiology. Discussed that antibiotics are not indicated for viral infections. Pt will be discharged with symptomatic treatment.    Pt with mild swelling to the joint spaces, knee swelling, tightness in the knee, and mildly restricted range of motion. Pt unable to perform full flexion of the knee.  Pt is without systemic symptoms, erythema or  redness of the joint consistent with gout or septic joint.  Patient X-Ray negative for obvious fracture or dislocation. Pain managed in ED. Pt advised to follow up with orthopedics at her already scheduled appointment. Patient given knee brace while in ED, conservative therapy recommended and discussed. Patient will be dc home & is agreeable with above plan.  I have also discussed reasons to return immediately to the ER.  Patient expresses understanding and agrees with plan.   I personally performed the services described in this documentation, which was scribed in my presence. The recorded information has been reviewed and is accurate.   Dahlia Client Calinda Stockinger, PA-C 09/11/12 2130

## 2012-09-11 NOTE — ED Provider Notes (Signed)
Medical screening examination/treatment/procedure(s) were performed by non-physician practitioner and as supervising physician I was immediately available for consultation/collaboration.   Rolan Bucco, MD 09/11/12 2308

## 2012-09-11 NOTE — ED Notes (Addendum)
Ortho tech called for knee sleeve.  

## 2012-09-11 NOTE — ED Notes (Signed)
Pt c/o L knee pain and swelling. Pt states she always has some swelling in her L knee, but she woke up this morning and it is worse. Pt states pain radiates up to the L side of her back. Pt ambulates with a cane. Pt also c/o cough for one week. Pt states the cough is worse at night. She is worried that she has whooping cough. Pt with no acute distress. Pt ambulatory with cane and steady gait to exam room.

## 2012-09-12 ENCOUNTER — Emergency Department (HOSPITAL_COMMUNITY)
Admission: EM | Admit: 2012-09-12 | Discharge: 2012-09-12 | Disposition: A | Discharge status: Home / Self Care | Payer: BC Managed Care – PPO

## 2013-02-27 ENCOUNTER — Inpatient Hospital Stay (HOSPITAL_COMMUNITY): Payer: BC Managed Care – PPO

## 2013-02-27 ENCOUNTER — Inpatient Hospital Stay (HOSPITAL_COMMUNITY)
Admission: EM | Admit: 2013-02-27 | Discharge: 2013-03-02 | DRG: 392 | Disposition: A | Discharge status: Home / Self Care | Payer: BC Managed Care – PPO | Attending: Family Medicine | Admitting: Family Medicine

## 2013-02-27 ENCOUNTER — Encounter (HOSPITAL_COMMUNITY): Payer: Self-pay | Admitting: Emergency Medicine

## 2013-02-27 ENCOUNTER — Emergency Department (HOSPITAL_COMMUNITY): Payer: BC Managed Care – PPO

## 2013-02-27 DIAGNOSIS — Z881 Allergy status to other antibiotic agents status: Secondary | ICD-10-CM

## 2013-02-27 DIAGNOSIS — Z885 Allergy status to narcotic agent status: Secondary | ICD-10-CM

## 2013-02-27 DIAGNOSIS — Z79899 Other long term (current) drug therapy: Secondary | ICD-10-CM

## 2013-02-27 DIAGNOSIS — Z23 Encounter for immunization: Secondary | ICD-10-CM

## 2013-02-27 DIAGNOSIS — K5732 Diverticulitis of large intestine without perforation or abscess without bleeding: Principal | ICD-10-CM

## 2013-02-27 DIAGNOSIS — F17200 Nicotine dependence, unspecified, uncomplicated: Secondary | ICD-10-CM

## 2013-02-27 DIAGNOSIS — J45909 Unspecified asthma, uncomplicated: Secondary | ICD-10-CM | POA: Diagnosis present

## 2013-02-27 DIAGNOSIS — Z8249 Family history of ischemic heart disease and other diseases of the circulatory system: Secondary | ICD-10-CM

## 2013-02-27 DIAGNOSIS — Z791 Long term (current) use of non-steroidal anti-inflammatories (NSAID): Secondary | ICD-10-CM

## 2013-02-27 DIAGNOSIS — Z720 Tobacco use: Secondary | ICD-10-CM | POA: Diagnosis present

## 2013-02-27 DIAGNOSIS — K5792 Diverticulitis of intestine, part unspecified, without perforation or abscess without bleeding: Secondary | ICD-10-CM

## 2013-02-27 DIAGNOSIS — Z7982 Long term (current) use of aspirin: Secondary | ICD-10-CM

## 2013-02-27 DIAGNOSIS — I10 Essential (primary) hypertension: Secondary | ICD-10-CM

## 2013-02-27 HISTORY — DX: Cardiac murmur, unspecified: R01.1

## 2013-02-27 HISTORY — DX: Tobacco use: Z72.0

## 2013-02-27 LAB — URINALYSIS, ROUTINE W REFLEX MICROSCOPIC
Glucose, UA: NEGATIVE mg/dL
Specific Gravity, Urine: 1.018 (ref 1.005–1.030)

## 2013-02-27 LAB — CBC WITH DIFFERENTIAL/PLATELET
Basophils Absolute: 0 10*3/uL (ref 0.0–0.1)
Basophils Relative: 0 % (ref 0–1)
HCT: 38.4 % (ref 36.0–46.0)
MCHC: 33.3 g/dL (ref 30.0–36.0)
Monocytes Absolute: 1.3 10*3/uL — ABNORMAL HIGH (ref 0.1–1.0)
Neutro Abs: 13.1 10*3/uL — ABNORMAL HIGH (ref 1.7–7.7)
Platelets: 357 10*3/uL (ref 150–400)
RDW: 14.4 % (ref 11.5–15.5)

## 2013-02-27 LAB — COMPREHENSIVE METABOLIC PANEL
AST: 9 U/L (ref 0–37)
Albumin: 3.4 g/dL — ABNORMAL LOW (ref 3.5–5.2)
Calcium: 8.9 mg/dL (ref 8.4–10.5)
Chloride: 99 mEq/L (ref 96–112)
Creatinine, Ser: 0.85 mg/dL (ref 0.50–1.10)
Sodium: 134 mEq/L — ABNORMAL LOW (ref 135–145)
Total Bilirubin: 0.9 mg/dL (ref 0.3–1.2)

## 2013-02-27 LAB — URINE MICROSCOPIC-ADD ON

## 2013-02-27 LAB — LIPASE, BLOOD: Lipase: 15 U/L (ref 11–59)

## 2013-02-27 MED ORDER — ONDANSETRON HCL 4 MG/2ML IJ SOLN
4.0000 mg | Freq: Once | INTRAMUSCULAR | Status: AC
  Administered 2013-02-27: 4 mg via INTRAVENOUS
  Filled 2013-02-27: qty 2

## 2013-02-27 MED ORDER — SODIUM CHLORIDE 0.9 % IV BOLUS (SEPSIS)
1000.0000 mL | Freq: Once | INTRAVENOUS | Status: AC
  Administered 2013-02-27: 1000 mL via INTRAVENOUS

## 2013-02-27 MED ORDER — SODIUM CHLORIDE 0.9 % IV SOLN
INTRAVENOUS | Status: AC
  Administered 2013-02-27: 21:00:00 via INTRAVENOUS
  Administered 2013-02-28: 100 mL/h via INTRAVENOUS

## 2013-02-27 MED ORDER — PNEUMOCOCCAL VAC POLYVALENT 25 MCG/0.5ML IJ INJ
0.5000 mL | INJECTION | INTRAMUSCULAR | Status: DC
  Filled 2013-02-27: qty 0.5

## 2013-02-27 MED ORDER — ASPIRIN 81 MG PO CHEW
81.0000 mg | CHEWABLE_TABLET | Freq: Every day | ORAL | Status: DC
  Administered 2013-02-27 – 2013-03-02 (×4): 81 mg via ORAL
  Filled 2013-02-27 (×4): qty 1

## 2013-02-27 MED ORDER — ENOXAPARIN SODIUM 40 MG/0.4ML ~~LOC~~ SOLN
40.0000 mg | SUBCUTANEOUS | Status: DC
  Administered 2013-02-27 – 2013-03-01 (×3): 40 mg via SUBCUTANEOUS
  Filled 2013-02-27 (×4): qty 0.4

## 2013-02-27 MED ORDER — ONDANSETRON HCL 4 MG PO TABS
4.0000 mg | ORAL_TABLET | Freq: Four times a day (QID) | ORAL | Status: DC | PRN
  Filled 2013-02-27: qty 1

## 2013-02-27 MED ORDER — IOHEXOL 300 MG/ML  SOLN
100.0000 mL | Freq: Once | INTRAMUSCULAR | Status: AC | PRN
  Administered 2013-02-27: 100 mL via INTRAVENOUS

## 2013-02-27 MED ORDER — HYDROMORPHONE HCL PF 1 MG/ML IJ SOLN
1.0000 mg | Freq: Once | INTRAMUSCULAR | Status: AC
  Administered 2013-02-27: 1 mg via INTRAVENOUS
  Filled 2013-02-27: qty 1

## 2013-02-27 MED ORDER — HYDROCODONE-ACETAMINOPHEN 5-325 MG PO TABS: 1.0000 | ORAL_TABLET | Freq: Four times a day (QID) | ORAL | Status: DC | PRN

## 2013-02-27 MED ORDER — CIPROFLOXACIN IN D5W 400 MG/200ML IV SOLN
400.0000 mg | Freq: Two times a day (BID) | INTRAVENOUS | Status: DC
  Administered 2013-02-28 – 2013-03-02 (×5): 400 mg via INTRAVENOUS
  Filled 2013-02-27 (×6): qty 200

## 2013-02-27 MED ORDER — ONDANSETRON HCL 4 MG/2ML IJ SOLN
4.0000 mg | Freq: Four times a day (QID) | INTRAMUSCULAR | Status: DC | PRN
  Administered 2013-02-27 – 2013-03-01 (×3): 4 mg via INTRAVENOUS
  Filled 2013-02-27 (×3): qty 2

## 2013-02-27 MED ORDER — ACETAMINOPHEN 650 MG RE SUPP: 650.0000 mg | Freq: Four times a day (QID) | RECTAL | Status: DC | PRN

## 2013-02-27 MED ORDER — ACETAMINOPHEN 325 MG PO TABS: 650.0000 mg | ORAL_TABLET | Freq: Four times a day (QID) | ORAL | Status: DC | PRN

## 2013-02-27 MED ORDER — SODIUM CHLORIDE 0.9 % IV SOLN: INTRAVENOUS | Status: DC

## 2013-02-27 MED ORDER — ACETAMINOPHEN 325 MG PO TABS
650.0000 mg | ORAL_TABLET | Freq: Once | ORAL | Status: AC
  Administered 2013-02-27: 650 mg via ORAL
  Filled 2013-02-27: qty 2

## 2013-02-27 MED ORDER — IOHEXOL 300 MG/ML  SOLN
50.0000 mL | Freq: Once | INTRAMUSCULAR | Status: AC | PRN
  Administered 2013-02-27: 50 mL via ORAL

## 2013-02-27 MED ORDER — SODIUM CHLORIDE 0.9 % IV SOLN
INTRAVENOUS | Status: DC
  Administered 2013-02-27: 19:00:00 via INTRAVENOUS

## 2013-02-27 MED ORDER — HYDROMORPHONE HCL PF 1 MG/ML IJ SOLN
1.0000 mg | INTRAMUSCULAR | Status: DC | PRN
  Administered 2013-02-27 – 2013-03-02 (×14): 1 mg via INTRAVENOUS
  Filled 2013-02-27 (×15): qty 1

## 2013-02-27 MED ORDER — ALBUTEROL SULFATE (5 MG/ML) 0.5% IN NEBU: 2.5000 mg | INHALATION_SOLUTION | RESPIRATORY_TRACT | Status: DC | PRN

## 2013-02-27 MED ORDER — METRONIDAZOLE IN NACL 5-0.79 MG/ML-% IV SOLN
500.0000 mg | Freq: Three times a day (TID) | INTRAVENOUS | Status: DC
  Administered 2013-02-27 – 2013-03-02 (×9): 500 mg via INTRAVENOUS
  Filled 2013-02-27 (×11): qty 100

## 2013-02-27 MED ORDER — CIPROFLOXACIN IN D5W 400 MG/200ML IV SOLN
400.0000 mg | Freq: Once | INTRAVENOUS | Status: AC
  Administered 2013-02-27: 400 mg via INTRAVENOUS
  Filled 2013-02-27 (×2): qty 200

## 2013-02-27 NOTE — ED Provider Notes (Signed)
CSN: [id number]     Arrival date & time 02/27/13  1520 History   First MD Initiated Contact with Patient 02/27/13 1539     Chief Complaint  Patient presents with  . Abdominal Pain   (Consider location/radiation/quality/duration/timing/severity/associated sxs/prior Treatment) HPI Comments: 57 year old female presents with 2 days of abdominal pain. She states is now moved into her left lower quadrant. She's also having pain in her left flank and left back. Denies any urinary symptoms. She has had a fever, was up to 103 last night. She states she's been nauseous today but has not any vomiting. Denies any diarrhea or constipation. Has never had symptoms like this before. Denies any respiratory or chest symptoms. She states the pain is currently a 10 out of 10.   Past Medical History  Diagnosis Date  . Hypertension   . Hypokalemia    Past Surgical History  Procedure Laterality Date  . Wrist surgery      gangular cyst  . Cystectomy      forehead; back of head  . Dilation and curettage of uterus     Family History  Problem Relation Age of Onset  . Hypertension Father   . Heart disease Father   . Hypertension Mother   . Heart disease Mother    History  Substance Use Topics  . Smoking status: Current Every Day Smoker  . Smokeless tobacco: Never Used  . Alcohol Use: Yes   OB History   Grav Para Term Preterm Abortions TAB SAB Ect Mult Living   3 1 1  2  2   1      Review of Systems  Constitutional: Positive for fever.  Respiratory: Negative for shortness of breath.   Cardiovascular: Negative for chest pain.  Gastrointestinal: Positive for nausea and abdominal pain. Negative for vomiting, diarrhea, constipation and blood in stool.  Genitourinary: Negative for dysuria and hematuria.  Musculoskeletal: Positive for back pain.  All other systems reviewed and are negative.    Allergies  Sulfa antibiotics and Other  Home Medications   Current Outpatient Rx  Name  Route  Sig   Dispense  Refill  . aspirin 81 MG chewable tablet   Oral   Chew 81 mg by mouth daily.         Marland Kitchen HYDROcodone-acetaminophen (NORCO/VICODIN) 5-325 MG per tablet      Take 1-2 tablets every 6 hours as needed for severe pain   12 tablet   0   . ibuprofen (ADVIL,MOTRIN) 600 MG tablet   Oral   Take 1 tablet (600 mg total) by mouth every 6 (six) hours as needed for pain.   20 tablet   0   . methocarbamol (ROBAXIN) 500 MG tablet   Oral   Take 2 tablets (1,000 mg total) by mouth 4 (four) times daily.   20 tablet   0   . methocarbamol (ROBAXIN) 750 MG tablet   Oral   Take 1 tablet (750 mg total) by mouth 4 (four) times daily as needed (Take 1 tablet every 6 hours as needed for muscle spasms.).   20 tablet   0   . naproxen (NAPROSYN) 500 MG tablet   Oral   Take 1 tablet (500 mg total) by mouth 2 (two) times daily as needed.   30 tablet   0   . oxyCODONE-acetaminophen (PERCOCET) 5-325 MG per tablet   Oral   Take 1 tablet by mouth every 4 (four) hours as needed for pain (Take 1- 2 tablets  every 4 - 6 hours as needed for pain.).   20 tablet   0   . POTASSIUM PO   Oral   Take 1 tablet by mouth daily.         . vitamin E 200 UNIT capsule   Oral   Take 200 Units by mouth daily.          BP 132/77  Pulse 111  Temp(Src) 100.1 F (37.8 C) (Oral)  Resp 17  SpO2 96% Physical Exam  Nursing note and vitals reviewed. Constitutional: She is oriented to person, place, and time. She appears well-developed and well-nourished. No distress.  HENT:  Head: Normocephalic and atraumatic.  Right Ear: External ear normal.  Left Ear: External ear normal.  Nose: Nose normal.  Mouth/Throat: Oropharynx is clear and moist.  Eyes: Right eye exhibits no discharge. Left eye exhibits no discharge.  Neck: Neck supple.  Cardiovascular: Regular rhythm, normal heart sounds and intact distal pulses.  Tachycardia present.   Pulmonary/Chest: Effort normal and breath sounds normal. No respiratory  distress. She has no wheezes. She has no rales.  Abdominal: Soft. She exhibits no distension. There is tenderness (worst in LLQ, also has some in RLQ and LUQ).  Musculoskeletal: She exhibits no edema.  Neurological: She is alert and oriented to person, place, and time.  Skin: Skin is warm and dry. She is not diaphoretic.    ED Course  Procedures (including critical care time) Labs Review Labs Reviewed  CBC WITH DIFFERENTIAL - Abnormal; Notable for the following:    WBC 16.7 (*)    Neutrophils Relative % 78 (*)    Neutro Abs 13.1 (*)    Monocytes Absolute 1.3 (*)    All other components within normal limits  COMPREHENSIVE METABOLIC PANEL - Abnormal; Notable for the following:    Sodium 134 (*)    Albumin 3.4 (*)    GFR calc non Af Amer 74 (*)    GFR calc Af Amer 86 (*)    All other components within normal limits  URINALYSIS, ROUTINE W REFLEX MICROSCOPIC - Abnormal; Notable for the following:    APPearance CLOUDY (*)    Hgb urine dipstick TRACE (*)    Leukocytes, UA MODERATE (*)    All other components within normal limits  LIPASE, BLOOD  URINE MICROSCOPIC-ADD ON  BASIC METABOLIC PANEL  CBC  CG4 I-STAT (LACTIC ACID)   Imaging Review Dg Chest 2 View  02/27/2013   CLINICAL DATA:  Intermittent shortness of breath.  EXAM: CHEST  2 VIEW  COMPARISON:  The 09/11/2012.  FINDINGS: The heart size and mediastinal contours are within normal limits. Both lungs are clear. The visualized skeletal structures are unremarkable. No change from priors.  IMPRESSION: No active cardiopulmonary disease.   Electronically Signed   By: Davonna Belling M.D.   On: 02/27/2013 19:24   Ct Abdomen Pelvis W Contrast  02/27/2013   CLINICAL DATA:  Left lower quadrant pain x2 days  EXAM: CT ABDOMEN AND PELVIS WITH CONTRAST  TECHNIQUE: Multidetector CT imaging of the abdomen and pelvis was performed using the standard protocol following bolus administration of intravenous contrast.  CONTRAST:  OMNIPAQUE IOHEXOL  300 MG/ML  SOLN  COMPARISON:  None.  FINDINGS: Dependent atelectasis in the bilateral lower lobes.  Tiny probable cyst in the inferior right hepatic lobe (series 2/ image 32).  Small, lobular spleen (series 2/image 19).  Pancreas and adrenal glands are within normal limits.  Gallbladder is unremarkable. No intrahepatic or extrahepatic  ductal dilatation.  Kidneys are within normal limits.  No hydronephrosis.  No evidence of bowel obstruction. Normal appendix. Colonic diverticulosis with associated inflammatory changes involving the distal descending colon (series 2/ images 48 and 51).  No drainable fluid collection/ abscess.  No free air.  Mild atherosclerotic calcifications of the aortic arch and branch vessels.  No abdominopelvic ascites.  Small retroperitoneal lymph nodes measuring up to 7 mm (series 2/ image 35), which does not meet pathologic CT size criteria.  Heterogeneous uterus with suspected uterine fibroids measuring up to 1.9 cm (series 2/image 57). Bilateral ovaries are within normal limits.  Bladder is within normal limits.  Numerous calcified pelvic phleboliths.  Mild degenerative changes of the visualized thoracolumbar spine.  IMPRESSION: Descending colonic diverticulitis.  No drainable fluid collection/abscess.  No free air.   Electronically Signed   By: Charline Bills M.D.   On: 02/27/2013 17:47    EKG Interpretation   None       MDM   1. Diverticulitis   2. Hypertension   3. Tobacco abuse   4. Acute diverticulitis    Patient with acute diverticulitis w/o peritonitis, abscess or obstruction. However, given her high fever at home, elevated WBC to 16 and nausea, will admit to hospitalist for IV abx and pain control    Audree Camel, MD 02/27/13 2358

## 2013-02-27 NOTE — ED Notes (Signed)
Results given to RN

## 2013-02-27 NOTE — H&P (Signed)
[facility]  History and Physical  Charlotte Garcia ZOX:[id number] DOB: [birth year] DOA: 02/27/2013  Referring physician: EDP PCP: No primary provider on file.  Outpatient Specialists:  1. Orthopedics: Dr. Annell Greening.  Chief Complaint: Abdominal pain  HPI: Charlotte Garcia is a 57 y.o. female with history of untreated hypertension, heart murmur, tobacco abuse, "bronchitis/asthma" presented to the ED with 2 day history of abdominal pain and fever. She was in her usual state of health until 02/26/13 when she developed subacute onset of suprapubic pain which soon moved and localized to left lower quadrant, "menstrual cramp-like" severe pain, radiating to lower back, associated with fever of 103F, nausea but no vomiting, normal consistency stool without blood and decreased appetite. Symptoms persisted and thereby she came to the emergency department where temperature maximum 100.22F, WBC 16.7 K. and CT abdomen shows descending colonic diverticulitis without complicating features. Patient denies prior such episodes and has not had a colonoscopy. She also complains of intermittent dyspnea and wheezing for a long time but has not had any since current symptoms began. Hospitalist admission requested.  Review of Systems: All systems reviewed and apart from history of presenting illness, are negative.  Past Medical History  Diagnosis Date  . Hypertension   . Hypokalemia   . Murmur, heart   . Bronchitis   . Tobacco abuse    Past Surgical History  Procedure Laterality Date  . Wrist surgery      gangular cyst  . Cystectomy      forehead; back of head  . Dilation and curettage of uterus     Social History:  reports that she has been smoking.  She has never used smokeless tobacco. She reports that she drinks alcohol. She reports that she does not use illicit drugs.  Divorced. Independent of activities of daily living. Menopausal.  Allergies  Allergen Reactions  . Sulfa Antibiotics   .  Other     Codiene allergy    Family History  Problem Relation Age of Onset  . Hypertension Father   . Heart disease Father   . Hypertension Mother   . Heart disease Mother   . Renal Disease Brother     Prior to Admission medications   Medication Sig Start Date End Date Taking? Authorizing Provider  aspirin 81 MG chewable tablet Chew 81 mg by mouth daily.   Yes Historical Provider, MD  HYDROcodone-acetaminophen (NORCO/VICODIN) 5-325 MG per tablet Take 1-2 tablets every 6 hours as needed for severe pain 08/19/12  Yes Renne Crigler, PA-C  ibuprofen (ADVIL,MOTRIN) 600 MG tablet Take 1 tablet (600 mg total) by mouth every 6 (six) hours as needed for pain. 08/19/12  Yes Renne Crigler, PA-C   Physical Exam: Filed Vitals:   02/27/13 1621 02/27/13 1630 02/27/13 1700 02/27/13 1804  BP: 132/84 130/84 114/81 130/83  Pulse: 102 95 92 89  Temp:      TempSrc:      Resp: 18 18 16 11   SpO2: 94% 95% 91% 98%     General exam: Moderately built and nourished female patient, lying comfortably supine on the gurney in no obvious distress.  Head, eyes and ENT: Nontraumatic and normocephalic. Pupils equally reacting to light and accommodation. Oral mucosa  borderline hydration .  Neck: Supple. No JVD, carotid bruit or thyromegaly.  Lymphatics: No lymphadenopathy.  Respiratory system: Clear to auscultation. No increased work of breathing.  Cardiovascular system: S1 and S2 heard, RRR. No JVD, murmurs, gallops, clicks or pedal edema.  Gastrointestinal system: Abdomen  is nondistended, soft. Mild tenderness in left lower quadrant and minimal tenderness in the suprapubic region. No rigidity, guarding or rebound . Normal bowel sounds heard. No organomegaly or masses appreciated.  Central nervous system: Alert and oriented. No focal neurological deficits.  Extremities: Symmetric 5 x 5 power. Peripheral pulses symmetrically felt.   Skin: No rashes or acute findings.  Musculoskeletal system: Negative  exam.  Psychiatry: Pleasant and cooperative.   Labs on Admission:  Basic Metabolic Panel:  Recent Labs Lab 02/27/13 1600  NA 134*  K 3.6  CL 99  CO2 24  GLUCOSE 93  BUN 10  CREATININE 0.85  CALCIUM 8.9   Liver Function Tests:  Recent Labs Lab 02/27/13 1600  AST 9  ALT 11  ALKPHOS 64  BILITOT 0.9  PROT 6.8  ALBUMIN 3.4*    Recent Labs Lab 02/27/13 1600  LIPASE 15   No results found for this basename: AMMONIA,  in the last 168 hours CBC:  Recent Labs Lab 02/27/13 1600  WBC 16.7*  NEUTROABS 13.1*  HGB 12.8  HCT 38.4  MCV 89.9  PLT 357   Cardiac Enzymes: No results found for this basename: CKTOTAL, CKMB, CKMBINDEX, TROPONINI,  in the last 168 hours  BNP (last 3 results) No results found for this basename: PROBNP,  in the last 8760 hours CBG: No results found for this basename: GLUCAP,  in the last 168 hours  Radiological Exams on Admission: Ct Abdomen Pelvis W Contrast  02/27/2013   CLINICAL DATA:  Left lower quadrant pain x2 days  EXAM: CT ABDOMEN AND PELVIS WITH CONTRAST  TECHNIQUE: Multidetector CT imaging of the abdomen and pelvis was performed using the standard protocol following bolus administration of intravenous contrast.  CONTRAST:  OMNIPAQUE IOHEXOL 300 MG/ML  SOLN  COMPARISON:  None.  FINDINGS: Dependent atelectasis in the bilateral lower lobes.  Tiny probable cyst in the inferior right hepatic lobe (series 2/ image 32).  Small, lobular spleen (series 2/image 19).  Pancreas and adrenal glands are within normal limits.  Gallbladder is unremarkable. No intrahepatic or extrahepatic ductal dilatation.  Kidneys are within normal limits.  No hydronephrosis.  No evidence of bowel obstruction. Normal appendix. Colonic diverticulosis with associated inflammatory changes involving the distal descending colon (series 2/ images 48 and 51).  No drainable fluid collection/ abscess.  No free air.  Mild atherosclerotic calcifications of the aortic arch and  branch vessels.  No abdominopelvic ascites.  Small retroperitoneal lymph nodes measuring up to 7 mm (series 2/ image 35), which does not meet pathologic CT size criteria.  Heterogeneous uterus with suspected uterine fibroids measuring up to 1.9 cm (series 2/image 57). Bilateral ovaries are within normal limits.  Bladder is within normal limits.  Numerous calcified pelvic phleboliths.  Mild degenerative changes of the visualized thoracolumbar spine.  IMPRESSION: Descending colonic diverticulitis.  No drainable fluid collection/abscess.  No free air.   Electronically Signed   By: Charline Bills M.D.   On: 02/27/2013 17:47    Assessment/Plan Principal Problem:   Acute diverticulitis Active Problems:   Hypertension   Tobacco abuse   Acute uncomplicated descending colon diverticulitis - Admit to Med Surg - Continue IV Cipro and Flagyl. Clear liquid diet and advance diet as tolerated in AM. Pain management. - IV fluids - Treat with antibiotics for 10-14 days in total. Post resolution of acute diverticulitis, patient will need GI consultation for screening colonoscopy in several weeks (6 weeks). Counseled patient who verbalized understanding.  Hypertension - Currently  controlled off medications. Monitor closely  Tobacco abuse - Cessation counseled.   "Bronchitis/asthma" - Currently asymptomatic. Follow chest X.Ray  No PCP - CM consultation     Code Status: Full  Family Communication: D/W female friend at bedside.  Disposition Plan: Home when medically stable.   Time spent: 60 minutes.  Marcellus Scott, MD, FACP, FHM. [facility] Pager [phone]  If 7PM-7AM, please contact night-coverage [email or web address] Password [facility] 02/27/2013, 6:44 PM

## 2013-02-27 NOTE — ED Notes (Signed)
She c/o llq area abd. Pain and tenderness since yesterday.  She also "felt like I had a little fever last night".  She denies n/v/d/dysuria and is in no distress.

## 2013-02-28 LAB — CBC
HCT: 32.1 % — ABNORMAL LOW (ref 36.0–46.0)
Hemoglobin: 10.7 g/dL — ABNORMAL LOW (ref 12.0–15.0)
MCH: 30.4 pg (ref 26.0–34.0)
MCHC: 33.3 g/dL (ref 30.0–36.0)
RBC: 3.52 MIL/uL — ABNORMAL LOW (ref 3.87–5.11)

## 2013-02-28 LAB — BASIC METABOLIC PANEL
BUN: 6 mg/dL (ref 6–23)
GFR calc Af Amer: >90 mL/min (ref >90)
GFR calc non Af Amer: >90 mL/min (ref >90)
Glucose, Bld: 121 mg/dL — ABNORMAL HIGH (ref 70–99)
Potassium: 3.7 mEq/L (ref 3.5–5.1)

## 2013-02-28 MED ORDER — PNEUMOCOCCAL VAC POLYVALENT 25 MCG/0.5ML IJ INJ
0.5000 mL | INJECTION | INTRAMUSCULAR | Status: AC
  Administered 2013-03-01: 0.5 mL via INTRAMUSCULAR
  Filled 2013-02-28 (×2): qty 0.5

## 2013-02-28 NOTE — Progress Notes (Signed)
[facility] PROGRESS NOTE  Charlotte RYBOLT RUE:[id number] DOB: 30-May-1955 DOA: 02/27/2013 PCP: No primary provider on file.  Assessment/Plan:  Acute diverticulitis - Continue ciprofloxacin and metronidazole - Advance diet as tolerated - Monitor WBC count - Supportive therapy  Active Problems:   Hypertension - Stable, continue monitor blood pressures and if values persistently elevated consider starting antihypertensive medication    Tobacco abuse - Continue recommend cessation  Code Status: Full Family Communication: Discussed directly with patient Disposition Plan: Pending further improvement in condition. Will advance diet as tolerated   Consultants:  None  Procedures:  None  Antibiotics:  Ciprofloxacin and metronidazole since admission  HPI/Subjective: Patient states she feels better but still having discomfort located in the left lower abdomen. Would like to try to advance her diet  Objective: Filed Vitals:   02/28/13 0600  BP: 97/56  Pulse: 84  Temp: 98.3 F (36.8 C)  Resp: 18    Intake/Output Summary (Last 24 hours) at 02/28/13 1257 Last data filed at 02/28/13 0900  Gross per 24 hour  Intake 1266.67 ml  Output    600 ml  Net 666.67 ml   Filed Weights   02/27/13 2014  Weight: 61.054 kg (134 lb 9.6 oz)    Exam:   General:  Patient in no acute distress, alert and awake  Cardiovascular: Regular rate and rhythm, no rub  Respiratory: Clear to auscultation bilaterally, no wheezes  Abdomen: Soft, tenderness at left lower quadrant, nondistended  Musculoskeletal: No cyanosis or clubbing  Data Reviewed: Basic Metabolic Panel:  Recent Labs Lab 02/27/13 1600 02/28/13 0512  NA 134* 137  K 3.6 3.7  CL 99 106  CO2 24 26  GLUCOSE 93 121*  BUN 10 6  CREATININE 0.85 0.73  CALCIUM 8.9 8.5   Liver Function Tests:  Recent Labs Lab 02/27/13 1600  AST 9  ALT 11  ALKPHOS 64  BILITOT 0.9  PROT 6.8  ALBUMIN 3.4*    Recent  Labs Lab 02/27/13 1600  LIPASE 15   No results found for this basename: AMMONIA,  in the last 168 hours CBC:  Recent Labs Lab 02/27/13 1600 02/28/13 0512  WBC 16.7* 14.6*  NEUTROABS 13.1*  --   HGB 12.8 10.7*  HCT 38.4 32.1*  MCV 89.9 91.2  PLT 357 310   Cardiac Enzymes: No results found for this basename: CKTOTAL, CKMB, CKMBINDEX, TROPONINI,  in the last 168 hours BNP (last 3 results) No results found for this basename: PROBNP,  in the last 8760 hours CBG: No results found for this basename: GLUCAP,  in the last 168 hours  No results found for this or any previous visit (from the past 240 hour(s)).   Studies: Dg Chest 2 View  02/27/2013   CLINICAL DATA:  Intermittent shortness of breath.  EXAM: CHEST  2 VIEW  COMPARISON:  The 09/11/2012.  FINDINGS: The heart size and mediastinal contours are within normal limits. Both lungs are clear. The visualized skeletal structures are unremarkable. No change from priors.  IMPRESSION: No active cardiopulmonary disease.   Electronically Signed   By: Davonna Belling M.D.   On: 02/27/2013 19:24   Ct Abdomen Pelvis W Contrast  02/27/2013   CLINICAL DATA:  Left lower quadrant pain x2 days  EXAM: CT ABDOMEN AND PELVIS WITH CONTRAST  TECHNIQUE: Multidetector CT imaging of the abdomen and pelvis was performed using the standard protocol following bolus administration of intravenous contrast.  CONTRAST:  OMNIPAQUE IOHEXOL 300 MG/ML  SOLN  COMPARISON:  None.  FINDINGS: Dependent atelectasis in the bilateral lower lobes.  Tiny probable cyst in the inferior right hepatic lobe (series 2/ image 32).  Small, lobular spleen (series 2/image 19).  Pancreas and adrenal glands are within normal limits.  Gallbladder is unremarkable. No intrahepatic or extrahepatic ductal dilatation.  Kidneys are within normal limits.  No hydronephrosis.  No evidence of bowel obstruction. Normal appendix. Colonic diverticulosis with associated inflammatory changes involving the  distal descending colon (series 2/ images 48 and 51).  No drainable fluid collection/ abscess.  No free air.  Mild atherosclerotic calcifications of the aortic arch and branch vessels.  No abdominopelvic ascites.  Small retroperitoneal lymph nodes measuring up to 7 mm (series 2/ image 35), which does not meet pathologic CT size criteria.  Heterogeneous uterus with suspected uterine fibroids measuring up to 1.9 cm (series 2/image 57). Bilateral ovaries are within normal limits.  Bladder is within normal limits.  Numerous calcified pelvic phleboliths.  Mild degenerative changes of the visualized thoracolumbar spine.  IMPRESSION: Descending colonic diverticulitis.  No drainable fluid collection/abscess.  No free air.   Electronically Signed   By: Charline Bills M.D.   On: 02/27/2013 17:47    Scheduled Meds: . aspirin  81 mg Oral Daily  . ciprofloxacin  400 mg Intravenous Q12H  . enoxaparin (LOVENOX) injection  40 mg Subcutaneous Q24H  . metronidazole  500 mg Intravenous Q8H  . pneumococcal 23 valent vaccine  0.5 mL Intramuscular Tomorrow-1000   Continuous Infusions: . sodium chloride 100 mL/hr (02/28/13 1218)    Principal Problem:   Acute diverticulitis Active Problems:   Hypertension   Tobacco abuse    Time spent: More than 35 minutes    Penny Pia  [facility] Pager [phone] M, please contact night-coverage at [email or web address], password [facility] 02/28/2013, 12:57 PM  LOS: 1 day

## 2013-03-01 MED ORDER — ENSURE COMPLETE PO LIQD
237.0000 mL | Freq: Two times a day (BID) | ORAL | Status: DC
  Administered 2013-03-01 – 2013-03-02 (×3): 237 mL via ORAL

## 2013-03-01 NOTE — Care Management Note (Signed)
    Page 1 of 1   03/01/2013     10:39:33 AM   CARE MANAGEMENT NOTE 03/01/2013  Patient:  Charlotte Garcia, Charlotte Garcia   Account Number:  [id number]  Date Initiated:  03/01/2013  Documentation initiated by:  Lorenda Ishihara  Subjective/Objective Assessment:   57 yo female admitted with diverticulitis. PTA lived at home alone     Action/Plan:   Home when stable   Anticipated DC Date:  03/05/2013   Anticipated DC Plan:  HOME/SELF CARE      DC Planning Services  CM consult  PCP issues      Choice offered to / List presented to:             Status of service:  Completed, signed off Medicare Important Message given?   (If response is "NO", the following Medicare IM given date fields will be blank) Date Medicare IM given:   Date Additional Medicare IM given:    Discharge Disposition:  HOME/SELF CARE  Per UR Regulation:  Reviewed for med. necessity/level of care/duration of stay  If discussed at Long Length of Stay Meetings, dates discussed:    Comments:  03-01-13 Lorenda Ishihara RN CM 1000 Spoke with patient at bedside, discussed referral for PCP. Instructed patient to contact customer service # on insurance card and also provided her with [employer] #. Instructed patient to contact these resources prior to d/c to make hospital f/u appt. Patient appreciative of visit. No further d/c needs indentified.

## 2013-03-01 NOTE — Progress Notes (Signed)
INITIAL NUTRITION ASSESSMENT  DOCUMENTATION CODES Per approved criteria  -Not Applicable   INTERVENTION: - Educated pt on low fiber diet therapy for diverticulitis - Ensure Complete BID - Will continue to monitor   NUTRITION DIAGNOSIS: Altered GI function related to abdominal pain with acute diverticulitis as evidenced by MD notes.   Goal: Pt to consume >90% of meals/supplements  Monitor:  Weights, labs, intake, abdominal pain  Reason for Assessment: Malnutrition screening tool   57 y.o. female  Admitting Dx: Acute diverticulitis  ASSESSMENT: Admitted with 2 day history of abdominal pain and fever. Found to have acute diverticulitis. Met with pt who reports the only thing she was eating for 2 days PTA was small amounts of white rice mixed with cream of chicken broth. Reports 2 pound unintended weight loss in the past week. Stated that she threw up this morning after dry heaving, said the emesis looked like liquid from her clear liquid diet. Still has abdominal pain. Lipase WNL. Agreeable to trying Ensure Complete later on this afternoon.   Height: Ht Readings from Last 1 Encounters:  02/27/13 5\' 2"  (1.575 m)    Weight: Wt Readings from Last 1 Encounters:  02/27/13 134 lb 9.6 oz (61.054 kg)    Ideal Body Weight: 110 lb   % Ideal Body Weight: 122%  Wt Readings from Last 10 Encounters:  02/27/13 134 lb 9.6 oz (61.054 kg)  05/14/11 129 lb 11.2 oz (58.832 kg)    Usual Body Weight: 136 lb   % Usual Body Weight: 98%  BMI:  Body mass index is 24.61 kg/(m^2).  Estimated Nutritional Needs: Kcal: 1610-9604 Protein: 75-85g Fluid: 1.6-1.8L/day  Skin: WNL  Diet Order: General  EDUCATION NEEDS: -Education needs addressed - discussed diet therapy for diverticulitis    Intake/Output Summary (Last 24 hours) at 03/01/13 1411 Last data filed at 03/01/13 1400  Gross per 24 hour  Intake   1785 ml  Output   4250 ml  Net  -2465 ml    Last BM:  12/13  Labs:   Recent Labs Lab 02/27/13 1600 02/28/13 0512  NA 134* 137  K 3.6 3.7  CL 99 106  CO2 24 26  BUN 10 6  CREATININE 0.85 0.73  CALCIUM 8.9 8.5  GLUCOSE 93 121*    CBG (last 3)  No results found for this basename: GLUCAP,  in the last 72 hours  Scheduled Meds: . aspirin  81 mg Oral Daily  . ciprofloxacin  400 mg Intravenous Q12H  . enoxaparin (LOVENOX) injection  40 mg Subcutaneous Q24H  . metronidazole  500 mg Intravenous Q8H    Continuous Infusions:   Past Medical History  Diagnosis Date  . Hypertension   . Hypokalemia   . Murmur, heart   . Bronchitis   . Tobacco abuse     Past Surgical History  Procedure Laterality Date  . Wrist surgery      gangular cyst  . Cystectomy      forehead; back of head  . Dilation and curettage of uterus      Levon Hedger MS, RD, LDN [phone] Pager [phone] After Hours Pager

## 2013-03-01 NOTE — Progress Notes (Signed)
[facility] PROGRESS NOTE  Charlotte Garcia ZOX:[id number] DOB: [birth year] DOA: 02/27/2013 PCP: No primary provider on file.  Assessment/Plan:  Acute diverticulitis - Continue ciprofloxacin and metronidazole - Advance diet as tolerated to regular diet - Monitor WBC count - Supportive therapy  Active Problems:   Hypertension - Stable, continue monitor blood pressures and if values persistently elevated consider starting antihypertensive medication    Tobacco abuse - Recommend cessation  Code Status: Full Family Communication: Discussed directly with patient Disposition Plan: Pending further improvement in condition. Will advance diet as tolerated   Consultants:  None  Procedures:  None  Antibiotics:  Ciprofloxacin and metronidazole since admission  HPI/Subjective: Patient reports that she would like to try something more than fluid diet. No new complaints reported overnight  Objective: Filed Vitals:   03/01/13 1342  BP: 129/81  Pulse: 73  Temp: 99 F (37.2 C)  Resp: 18    Intake/Output Summary (Last 24 hours) at 03/01/13 1354 Last data filed at 03/01/13 1000  Gross per 24 hour  Intake   1825 ml  Output   3900 ml  Net  -2075 ml   Filed Weights   02/27/13 2014  Weight: 61.054 kg (134 lb 9.6 oz)    Exam:   General:  Patient in no acute distress, alert and awake  Cardiovascular: Regular rate and rhythm, no rub  Respiratory: Clear to auscultation bilaterally, no wheezes  Abdomen: Soft, tenderness at left lower quadrant, nondistended  Musculoskeletal: No cyanosis or clubbing  Data Reviewed: Basic Metabolic Panel:  Recent Labs Lab 02/27/13 1600 02/28/13 0512  NA 134* 137  K 3.6 3.7  CL 99 106  CO2 24 26  GLUCOSE 93 121*  BUN 10 6  CREATININE 0.85 0.73  CALCIUM 8.9 8.5   Liver Function Tests:  Recent Labs Lab 02/27/13 1600  AST 9  ALT 11  ALKPHOS 64  BILITOT 0.9  PROT 6.8  ALBUMIN 3.4*    Recent Labs Lab  02/27/13 1600  LIPASE 15   No results found for this basename: AMMONIA,  in the last 168 hours CBC:  Recent Labs Lab 02/27/13 1600 02/28/13 0512  WBC 16.7* 14.6*  NEUTROABS 13.1*  --   HGB 12.8 10.7*  HCT 38.4 32.1*  MCV 89.9 91.2  PLT 357 310   Cardiac Enzymes: No results found for this basename: CKTOTAL, CKMB, CKMBINDEX, TROPONINI,  in the last 168 hours BNP (last 3 results) No results found for this basename: PROBNP,  in the last 8760 hours CBG: No results found for this basename: GLUCAP,  in the last 168 hours  No results found for this or any previous visit (from the past 240 hour(s)).   Studies: Dg Chest 2 View  02/27/2013   CLINICAL DATA:  Intermittent shortness of breath.  EXAM: CHEST  2 VIEW  COMPARISON:  The 09/11/2012.  FINDINGS: The heart size and mediastinal contours are within normal limits. Both lungs are clear. The visualized skeletal structures are unremarkable. No change from priors.  IMPRESSION: No active cardiopulmonary disease.   Electronically Signed   By: Davonna Belling M.D.   On: 02/27/2013 19:24   Ct Abdomen Pelvis W Contrast  02/27/2013   CLINICAL DATA:  Left lower quadrant pain x2 days  EXAM: CT ABDOMEN AND PELVIS WITH CONTRAST  TECHNIQUE: Multidetector CT imaging of the abdomen and pelvis was performed using the standard protocol following bolus administration of intravenous contrast.  CONTRAST:  OMNIPAQUE IOHEXOL 300 MG/ML  SOLN  COMPARISON:  None.  FINDINGS: Dependent atelectasis in the bilateral lower lobes.  Tiny probable cyst in the inferior right hepatic lobe (series 2/ image 32).  Small, lobular spleen (series 2/image 19).  Pancreas and adrenal glands are within normal limits.  Gallbladder is unremarkable. No intrahepatic or extrahepatic ductal dilatation.  Kidneys are within normal limits.  No hydronephrosis.  No evidence of bowel obstruction. Normal appendix. Colonic diverticulosis with associated inflammatory changes involving the distal  descending colon (series 2/ images 48 and 51).  No drainable fluid collection/ abscess.  No free air.  Mild atherosclerotic calcifications of the aortic arch and branch vessels.  No abdominopelvic ascites.  Small retroperitoneal lymph nodes measuring up to 7 mm (series 2/ image 35), which does not meet pathologic CT size criteria.  Heterogeneous uterus with suspected uterine fibroids measuring up to 1.9 cm (series 2/image 57). Bilateral ovaries are within normal limits.  Bladder is within normal limits.  Numerous calcified pelvic phleboliths.  Mild degenerative changes of the visualized thoracolumbar spine.  IMPRESSION: Descending colonic diverticulitis.  No drainable fluid collection/abscess.  No free air.   Electronically Signed   By: Charline Bills M.D.   On: 02/27/2013 17:47    Scheduled Meds: . aspirin  81 mg Oral Daily  . ciprofloxacin  400 mg Intravenous Q12H  . enoxaparin (LOVENOX) injection  40 mg Subcutaneous Q24H  . metronidazole  500 mg Intravenous Q8H   Continuous Infusions:    Principal Problem:   Acute diverticulitis Active Problems:   Hypertension   Tobacco abuse    Time spent: More than 35 minutes    Penny Pia  [facility] Pager [phone] M, please contact night-coverage at [email or web address], password [facility] 03/01/2013, 1:54 PM  LOS: 2 days

## 2013-03-02 MED ORDER — ENSURE COMPLETE PO LIQD: 237.0000 mL | Freq: Two times a day (BID) | ORAL | Status: DC

## 2013-03-02 MED ORDER — HYDROCODONE-ACETAMINOPHEN 5-325 MG PO TABS: 1.0000 | ORAL_TABLET | Freq: Four times a day (QID) | ORAL | Status: DC | PRN

## 2013-03-02 MED ORDER — METRONIDAZOLE 500 MG PO TABS: 500.0000 mg | ORAL_TABLET | Freq: Three times a day (TID) | ORAL | Status: DC

## 2013-03-02 MED ORDER — CIPROFLOXACIN HCL 500 MG PO TABS: 500.0000 mg | ORAL_TABLET | Freq: Two times a day (BID) | ORAL | Status: DC

## 2013-03-02 MED ORDER — ONDANSETRON 4 MG PO TBDP: 4.0000 mg | ORAL_TABLET | Freq: Three times a day (TID) | ORAL | Status: DC | PRN

## 2013-03-02 NOTE — Discharge Summary (Signed)
Physician Discharge Summary  Charlotte Garcia AOZ:[id number] DOB: 14-Oct-1955 DOA: 02/27/2013  PCP: No primary provider on file.  Admit date: 02/27/2013 Discharge date: 03/02/2013  Time spent: More than 35 minutes minutes  Recommendations for Outpatient Follow-up:  1. Patient to followup with primary care physician in 1-2 weeks or sooner if any new concerns arise 2. Consider reassessing white blood cell count although patient was showing clinical signs of improvement and WBC is trending down after commencement of antibiotics 3. Continue to monitor blood pressures  Discharge Diagnoses:  Principal Problem:   Acute diverticulitis Active Problems:   Hypertension   Tobacco abuse   Discharge Condition: Stable Diet recommendation: As tolerated Filed Weights   02/27/13 2014  Weight: 61.054 kg (134 lb 9.6 oz)    History of present illness:  57 year old who presented with acute non-complicated diverticulitis  Hospital Course:  Acute diverticulitis  - Continue ciprofloxacin and metronidazole on discharge - Advance diet as tolerated  - Supportive therapy and discharge with anti-emetics  Active Problems:  Hypertension  - Stable,  Tobacco abuse  - Recommended cessation   Procedures:  As listed below  Consultations:  None  Discharge Exam: Filed Vitals:   03/02/13 0515  BP: 118/78  Pulse: 72  Temp: 98.4 F (36.9 C)  Resp: 16    General: Pt in NAD, Alert and Awake Cardiovascular: RRR, no MRG Respiratory: CTA BL, no wheezes Abdomen: soft, tenderness on palpation of LLQ, no rebound tenderness  Discharge Instructions  Discharge Orders   Future Orders Complete By Expires   Call MD for:  persistant nausea and vomiting  As directed    Call MD for:  severe uncontrolled pain  As directed    Call MD for:  temperature >100.4  As directed    Diet - low sodium heart healthy  As directed    Discharge instructions  As directed    Comments:     Please follow up with your  primary care physician in 1-2 weeks or sooner.   Increase activity slowly  As directed        Medication List    STOP taking these medications       ibuprofen 600 MG tablet  Commonly known as:  ADVIL,MOTRIN      TAKE these medications       aspirin 81 MG chewable tablet  Chew 81 mg by mouth daily.     ciprofloxacin 500 MG tablet  Commonly known as:  CIPRO  Take 1 tablet (500 mg total) by mouth 2 (two) times daily.     feeding supplement (ENSURE COMPLETE) Liqd  Take 237 mLs by mouth 2 (two) times daily between meals.     HYDROcodone-acetaminophen 5-325 MG per tablet  Commonly known as:  NORCO/VICODIN  Take 1 tablet by mouth every 6 (six) hours as needed for moderate pain.     metroNIDAZOLE 500 MG tablet  Commonly known as:  FLAGYL  Take 1 tablet (500 mg total) by mouth 3 (three) times daily.     ondansetron 4 MG disintegrating tablet  Commonly known as:  ZOFRAN ODT  Take 1 tablet (4 mg total) by mouth every 8 (eight) hours as needed for nausea or vomiting.       Allergies  Allergen Reactions  . Sulfa Antibiotics   . Other     Codiene allergy      The results of significant diagnostics from this hospitalization (including imaging, microbiology, ancillary and laboratory) are listed below for reference.  Significant Diagnostic Studies: Dg Chest 2 View  02/27/2013   CLINICAL DATA:  Intermittent shortness of breath.  EXAM: CHEST  2 VIEW  COMPARISON:  The 09/11/2012.  FINDINGS: The heart size and mediastinal contours are within normal limits. Both lungs are clear. The visualized skeletal structures are unremarkable. No change from priors.  IMPRESSION: No active cardiopulmonary disease.   Electronically Signed   By: Davonna Belling M.D.   On: 02/27/2013 19:24   Ct Abdomen Pelvis W Contrast  02/27/2013   CLINICAL DATA:  Left lower quadrant pain x2 days  EXAM: CT ABDOMEN AND PELVIS WITH CONTRAST  TECHNIQUE: Multidetector CT imaging of the abdomen and pelvis was performed  using the standard protocol following bolus administration of intravenous contrast.  CONTRAST:  OMNIPAQUE IOHEXOL 300 MG/ML  SOLN  COMPARISON:  None.  FINDINGS: Dependent atelectasis in the bilateral lower lobes.  Tiny probable cyst in the inferior right hepatic lobe (series 2/ image 32).  Small, lobular spleen (series 2/image 19).  Pancreas and adrenal glands are within normal limits.  Gallbladder is unremarkable. No intrahepatic or extrahepatic ductal dilatation.  Kidneys are within normal limits.  No hydronephrosis.  No evidence of bowel obstruction. Normal appendix. Colonic diverticulosis with associated inflammatory changes involving the distal descending colon (series 2/ images 48 and 51).  No drainable fluid collection/ abscess.  No free air.  Mild atherosclerotic calcifications of the aortic arch and branch vessels.  No abdominopelvic ascites.  Small retroperitoneal lymph nodes measuring up to 7 mm (series 2/ image 35), which does not meet pathologic CT size criteria.  Heterogeneous uterus with suspected uterine fibroids measuring up to 1.9 cm (series 2/image 57). Bilateral ovaries are within normal limits.  Bladder is within normal limits.  Numerous calcified pelvic phleboliths.  Mild degenerative changes of the visualized thoracolumbar spine.  IMPRESSION: Descending colonic diverticulitis.  No drainable fluid collection/abscess.  No free air.   Electronically Signed   By: Charline Bills M.D.   On: 02/27/2013 17:47    Microbiology: No results found for this or any previous visit (from the past 240 hour(s)).   Labs: Basic Metabolic Panel:  Recent Labs Lab 02/27/13 1600 02/28/13 0512  NA 134* 137  K 3.6 3.7  CL 99 106  CO2 24 26  GLUCOSE 93 121*  BUN 10 6  CREATININE 0.85 0.73  CALCIUM 8.9 8.5   Liver Function Tests:  Recent Labs Lab 02/27/13 1600  AST 9  ALT 11  ALKPHOS 64  BILITOT 0.9  PROT 6.8  ALBUMIN 3.4*    Recent Labs Lab 02/27/13 1600  LIPASE 15   No  results found for this basename: AMMONIA,  in the last 168 hours CBC:  Recent Labs Lab 02/27/13 1600 02/28/13 0512  WBC 16.7* 14.6*  NEUTROABS 13.1*  --   HGB 12.8 10.7*  HCT 38.4 32.1*  MCV 89.9 91.2  PLT 357 310   Cardiac Enzymes: No results found for this basename: CKTOTAL, CKMB, CKMBINDEX, TROPONINI,  in the last 168 hours BNP: BNP (last 3 results) No results found for this basename: PROBNP,  in the last 8760 hours CBG: No results found for this basename: GLUCAP,  in the last 168 hours     Signed:  Penny Pia  [facility] 03/02/2013, 7:38 PM

## 2013-05-27 ENCOUNTER — Encounter (HOSPITAL_COMMUNITY): Payer: Self-pay | Admitting: Emergency Medicine

## 2013-05-27 ENCOUNTER — Emergency Department (HOSPITAL_COMMUNITY)
Admission: EM | Admit: 2013-05-27 | Discharge: 2013-05-28 | Disposition: A | Discharge status: Home / Self Care | Payer: BC Managed Care – PPO | Attending: Emergency Medicine | Admitting: Emergency Medicine

## 2013-05-27 ENCOUNTER — Emergency Department (HOSPITAL_COMMUNITY): Payer: BC Managed Care – PPO

## 2013-05-27 DIAGNOSIS — R52 Pain, unspecified: Secondary | ICD-10-CM | POA: Insufficient documentation

## 2013-05-27 DIAGNOSIS — R509 Fever, unspecified: Secondary | ICD-10-CM | POA: Insufficient documentation

## 2013-05-27 DIAGNOSIS — R5381 Other malaise: Secondary | ICD-10-CM | POA: Insufficient documentation

## 2013-05-27 DIAGNOSIS — J3489 Other specified disorders of nose and nasal sinuses: Secondary | ICD-10-CM | POA: Insufficient documentation

## 2013-05-27 DIAGNOSIS — Z8639 Personal history of other endocrine, nutritional and metabolic disease: Secondary | ICD-10-CM | POA: Insufficient documentation

## 2013-05-27 DIAGNOSIS — R011 Cardiac murmur, unspecified: Secondary | ICD-10-CM | POA: Insufficient documentation

## 2013-05-27 DIAGNOSIS — R5383 Other fatigue: Secondary | ICD-10-CM

## 2013-05-27 DIAGNOSIS — F17200 Nicotine dependence, unspecified, uncomplicated: Secondary | ICD-10-CM | POA: Insufficient documentation

## 2013-05-27 DIAGNOSIS — Z862 Personal history of diseases of the blood and blood-forming organs and certain disorders involving the immune mechanism: Secondary | ICD-10-CM | POA: Insufficient documentation

## 2013-05-27 DIAGNOSIS — I10 Essential (primary) hypertension: Secondary | ICD-10-CM | POA: Insufficient documentation

## 2013-05-27 DIAGNOSIS — J40 Bronchitis, not specified as acute or chronic: Secondary | ICD-10-CM | POA: Insufficient documentation

## 2013-05-27 LAB — CBC WITH DIFFERENTIAL/PLATELET
Basophils Absolute: 0 10*3/uL (ref 0.0–0.1)
Basophils Relative: 0 % (ref 0–1)
EOS ABS: 0.1 10*3/uL (ref 0.0–0.7)
EOS PCT: 1 % (ref 0–5)
HEMATOCRIT: 42.1 % (ref 36.0–46.0)
HEMOGLOBIN: 14.2 g/dL (ref 12.0–15.0)
LYMPHS ABS: 2.8 10*3/uL (ref 0.7–4.0)
Lymphocytes Relative: 36 % (ref 12–46)
MCH: 30.7 pg (ref 26.0–34.0)
MCHC: 33.7 g/dL (ref 30.0–36.0)
MCV: 90.9 fL (ref 78.0–100.0)
MONOS PCT: 7 % (ref 3–12)
Monocytes Absolute: 0.5 10*3/uL (ref 0.1–1.0)
Neutro Abs: 4.2 10*3/uL (ref 1.7–7.7)
Neutrophils Relative %: 56 % (ref 43–77)
Platelets: 297 10*3/uL (ref 150–400)
RBC: 4.63 MIL/uL (ref 3.87–5.11)
RDW: 15.2 % (ref 11.5–15.5)
WBC: 7.6 10*3/uL (ref 4.0–10.5)

## 2013-05-27 LAB — I-STAT CHEM 8, ED
BUN: 10 mg/dL (ref 6–23)
CREATININE: 0.7 mg/dL (ref 0.50–1.10)
Calcium, Ion: 1.16 mmol/L (ref 1.12–1.23)
Chloride: 104 mEq/L (ref 96–112)
GLUCOSE: 91 mg/dL (ref 70–99)
HCT: 47 % — ABNORMAL HIGH (ref 36.0–46.0)
HEMOGLOBIN: 16 g/dL — AB (ref 12.0–15.0)
Potassium: 3.1 mEq/L — ABNORMAL LOW (ref 3.7–5.3)
Sodium: 142 mEq/L (ref 137–147)
TCO2: 25 mmol/L (ref 0–100)

## 2013-05-27 MED ORDER — IPRATROPIUM BROMIDE 0.02 % IN SOLN: 0.5000 mg | Freq: Once | RESPIRATORY_TRACT | Status: DC

## 2013-05-27 MED ORDER — ALBUTEROL SULFATE (2.5 MG/3ML) 0.083% IN NEBU
5.0000 mg | INHALATION_SOLUTION | Freq: Once | RESPIRATORY_TRACT | Status: DC
Start: 2013-05-28 — End: 2013-05-28

## 2013-05-27 NOTE — ED Notes (Signed)
Pt reports head congestion, cough, sob and fever with body aches. Pt states she is taking cough meds to help with symptoms and reports no relief. Pt alert and ambulatory.

## 2013-05-28 MED ORDER — GUAIFENESIN ER 600 MG PO TB12: 600.0000 mg | ORAL_TABLET | Freq: Two times a day (BID) | ORAL | Status: DC

## 2013-05-28 MED ORDER — PREDNISONE 10 MG PO TABS: 20.0000 mg | ORAL_TABLET | Freq: Every day | ORAL | Status: DC

## 2013-05-28 MED ORDER — POTASSIUM CHLORIDE CRYS ER 20 MEQ PO TBCR
40.0000 meq | EXTENDED_RELEASE_TABLET | Freq: Once | ORAL | Status: AC
  Administered 2013-05-28: 40 meq via ORAL
  Filled 2013-05-28: qty 2

## 2013-05-28 MED ORDER — IPRATROPIUM-ALBUTEROL 0.5-2.5 (3) MG/3ML IN SOLN
3.0000 mL | Freq: Once | RESPIRATORY_TRACT | Status: AC
  Administered 2013-05-28: 3 mL via RESPIRATORY_TRACT
  Filled 2013-05-28: qty 3

## 2013-05-28 MED ORDER — PREDNISONE 20 MG PO TABS
60.0000 mg | ORAL_TABLET | Freq: Once | ORAL | Status: AC
  Administered 2013-05-28: 60 mg via ORAL
  Filled 2013-05-28: qty 3

## 2013-05-28 MED ORDER — ALBUTEROL SULFATE (2.5 MG/3ML) 0.083% IN NEBU
2.5000 mg | INHALATION_SOLUTION | Freq: Once | RESPIRATORY_TRACT | Status: AC
  Administered 2013-05-28: 2.5 mg via RESPIRATORY_TRACT
  Filled 2013-05-28: qty 3

## 2013-05-28 MED ORDER — BENZONATATE 100 MG PO CAPS
100.0000 mg | ORAL_CAPSULE | Freq: Once | ORAL | Status: AC
  Administered 2013-05-28: 100 mg via ORAL
  Filled 2013-05-28: qty 1

## 2013-05-28 MED ORDER — ALBUTEROL SULFATE HFA 108 (90 BASE) MCG/ACT IN AERS: 2.0000 | INHALATION_SPRAY | RESPIRATORY_TRACT | Status: DC | PRN

## 2013-05-28 MED ORDER — BENZONATATE 100 MG PO CAPS: 100.0000 mg | ORAL_CAPSULE | Freq: Three times a day (TID) | ORAL | Status: DC

## 2013-05-28 NOTE — ED Provider Notes (Signed)
CSN: [id number]     Arrival date & time 05/27/13  2115 History   First MD Initiated Contact with Patient 05/27/13 2334     Chief Complaint  Patient presents with  . Cough  . Nasal Congestion   HPI  History provided by the patient. Patient is a 58 year old female with history of hypertension, tobacco use, previous bronchitis type infections who presents with complaints of generalized body aches, fever, cough and congestion. Patient states that she first began having some increased fatigue and body aches last week. Over the weekend she began having worsening cough and congestion symptoms have been persistent. She has been taking some ibuprofen and over-the-counter cough medicines without any significant relief. She reports some increased soreness with coughing. Cough is productive of phlegm. No hemoptysis. Denies any episodes of vomiting or diarrhea. No other aggravating or alleviating factors. No other associated symptoms.    Past Medical History  Diagnosis Date  . Hypertension   . Hypokalemia   . Murmur, heart   . Bronchitis   . Tobacco abuse    Past Surgical History  Procedure Laterality Date  . Wrist surgery      gangular cyst  . Cystectomy      forehead; back of head  . Dilation and curettage of uterus     Family History  Problem Relation Age of Onset  . Hypertension Father   . Heart disease Father   . Hypertension Mother   . Heart disease Mother   . Renal Disease Brother    History  Substance Use Topics  . Smoking status: Current Some Day Smoker -- 0.50 packs/day for 20 years    Types: Cigarettes  . Smokeless tobacco: Never Used  . Alcohol Use: Yes   OB History   Grav Para Term Preterm Abortions TAB SAB Ect Mult Living   3 1 1  2  2   1      Review of Systems  Constitutional: Positive for fever, chills and fatigue.  HENT: Positive for congestion and rhinorrhea.   Respiratory: Positive for cough and shortness of breath.   Gastrointestinal: Negative for nausea,  vomiting and diarrhea.  All other systems reviewed and are negative.      Allergies  Sulfa antibiotics and Codeine  Home Medications   Current Outpatient Rx  Name  Route  Sig  Dispense  Refill  . ibuprofen (ADVIL,MOTRIN) 200 MG tablet   Oral   Take 200 mg by mouth every 6 (six) hours as needed for moderate pain.          BP 151/97  Pulse 84  Temp(Src) 98.3 F (36.8 C) (Oral)  Resp 18  SpO2 98% Physical Exam  Nursing note and vitals reviewed. Constitutional: She is oriented to person, place, and time. She appears well-developed and well-nourished. No distress.  HENT:  Head: Normocephalic.  Right Ear: Tympanic membrane normal.  Left Ear: Tympanic membrane normal.  Mouth/Throat: Oropharynx is clear and moist.  Eyes: Conjunctivae and EOM are normal. Pupils are equal, round, and reactive to light.  Neck: Normal range of motion. Neck supple.  No meningeal signs  Cardiovascular: Normal rate and regular rhythm.   No murmur heard. Pulmonary/Chest: Effort normal. No respiratory distress. She has wheezes. She has no rales. She exhibits no tenderness.  Abdominal: Soft. There is no tenderness. There is no rebound.  Musculoskeletal: Normal range of motion.  Neurological: She is alert and oriented to person, place, and time.  Skin: Skin is warm and dry. No rash  noted.  Psychiatric: She has a normal mood and affect. Her behavior is normal.    ED Course  Procedures   DIAGNOSTIC STUDIES: Oxygen Saturation is 100% on RA.    COORDINATION OF CARE:  Nursing notes reviewed. Vital signs reviewed. Initial pt interview and examination performed.   12:10 AM-pt seen and evaluated.  Pt appears well no acute distress.  Normal respiration and o2 sats.  Afebrile.  Wheezing on exam.  Discussed work up plan with pt at bedside, which includes labs and x-ray. Pt agrees with plan.  X-ray reviewed.  No signs for CAP.  Pt feeling better after breathing tx.  At this time stable for dc home. Pt  agrees with plan.   Treatment plan initiated: Medications  benzonatate (TESSALON) capsule 100 mg (not administered)  albuterol (PROVENTIL HFA;VENTOLIN HFA) 108 (90 BASE) MCG/ACT inhaler 2 puff (not administered)  ipratropium-albuterol (DUONEB) 0.5-2.5 (3) MG/3ML nebulizer solution 3 mL (not administered)  albuterol (PROVENTIL) (2.5 MG/3ML) 0.083% nebulizer solution 2.5 mg (not administered)    Results for orders placed during the hospital encounter of 05/27/13  CBC WITH DIFFERENTIAL      Result Value Ref Range   WBC 7.6  4.0 - 10.5 K/uL   RBC 4.63  3.87 - 5.11 MIL/uL   Hemoglobin 14.2  12.0 - 15.0 g/dL   HCT 42.1  36.0 - 46.0 %   MCV 90.9  78.0 - 100.0 fL   MCH 30.7  26.0 - 34.0 pg   MCHC 33.7  30.0 - 36.0 g/dL   RDW 15.2  11.5 - 15.5 %   Platelets 297  150 - 400 K/uL   Neutrophils Relative % 56  43 - 77 %   Neutro Abs 4.2  1.7 - 7.7 K/uL   Lymphocytes Relative 36  12 - 46 %   Lymphs Abs 2.8  0.7 - 4.0 K/uL   Monocytes Relative 7  3 - 12 %   Monocytes Absolute 0.5  0.1 - 1.0 K/uL   Eosinophils Relative 1  0 - 5 %   Eosinophils Absolute 0.1  0.0 - 0.7 K/uL   Basophils Relative 0  0 - 1 %   Basophils Absolute 0.0  0.0 - 0.1 K/uL  I-STAT CHEM 8, ED      Result Value Ref Range   Sodium 142  137 - 147 mEq/L   Potassium 3.1 (*) 3.7 - 5.3 mEq/L   Chloride 104  96 - 112 mEq/L   BUN 10  6 - 23 mg/dL   Creatinine, Ser 0.70  0.50 - 1.10 mg/dL   Glucose, Bld 91  70 - 99 mg/dL   Calcium, Ion 1.16  1.12 - 1.23 mmol/L   TCO2 25  0 - 100 mmol/L   Hemoglobin 16.0 (*) 12.0 - 15.0 g/dL   HCT 47.0 (*) 36.0 - 46.0 %     Imaging Review Dg Chest 2 View  05/28/2013   CLINICAL DATA:  Cough, fever.  EXAM: CHEST  2 VIEW  COMPARISON:  February 27, 2013.  FINDINGS: The heart size and mediastinal contours are within normal limits. Both lungs are clear. The visualized skeletal structures are unremarkable.  IMPRESSION: No acute cardiopulmonary abnormality seen.   Electronically Signed   By: Sabino Dick M.D.   On: 05/28/2013 00:10     MDM   Final diagnoses:  Bronchitis      Martie Lee, PA-C 05/28/13 [phone]

## 2013-05-28 NOTE — Discharge Instructions (Signed)
Your lab testing and x-ray had not shown any signs for a concerning or emergent cause of your symptoms. Your providers today feel that you have a bronchitis infection. Please use the inhaler you were given by taking 2 puffs every 4 hours as needed for cough and shortness of breath. Use the other medications prescribed as instructed and followup with a primary care provider.    Bronchitis Bronchitis is inflammation of the airways that extend from the windpipe into the lungs (bronchi). The inflammation often causes mucus to develop, which leads to a cough. If the inflammation becomes severe, it may cause shortness of breath. CAUSES  Bronchitis may be caused by:   Viral infections.   Bacteria.   Cigarette smoke.   Allergens, pollutants, and other irritants.  SIGNS AND SYMPTOMS  The most common symptom of bronchitis is a frequent cough that produces mucus. Other symptoms include:  Fever.   Body aches.   Chest congestion.   Chills.   Shortness of breath.   Sore throat.  DIAGNOSIS  Bronchitis is usually diagnosed through a medical history and physical exam. Tests, such as chest X-rays, are sometimes done to rule out other conditions.  TREATMENT  You may need to avoid contact with whatever caused the problem (smoking, for example). Medicines are sometimes needed. These may include:  Antibiotics. These may be prescribed if the condition is caused by bacteria.  Cough suppressants. These may be prescribed for relief of cough symptoms.   Inhaled medicines. These may be prescribed to help open your airways and make it easier for you to breathe.   Steroid medicines. These may be prescribed for those with recurrent (chronic) bronchitis. HOME CARE INSTRUCTIONS  Get plenty of rest.   Drink enough fluids to keep your urine clear or pale yellow (unless you have a medical condition that requires fluid restriction). Increasing fluids may help thin your secretions and will  prevent dehydration.   Only take over-the-counter or prescription medicines as directed by your health care provider.  Only take antibiotics as directed. Make sure you finish them even if you start to feel better.  Avoid secondhand smoke, irritating chemicals, and strong fumes. These will make bronchitis worse. If you are a smoker, quit smoking. Consider using nicotine gum or skin patches to help control withdrawal symptoms. Quitting smoking will help your lungs heal faster.   Put a cool-mist humidifier in your bedroom at night to moisten the air. This may help loosen mucus. Change the water in the humidifier daily. You can also run the hot water in your shower and sit in the bathroom with the door closed for 5 10 minutes.   Follow up with your health care provider as directed.   Wash your hands frequently to avoid catching bronchitis again or spreading an infection to others.  SEEK MEDICAL CARE IF: Your symptoms do not improve after 1 week of treatment.  SEEK IMMEDIATE MEDICAL CARE IF:  Your fever increases.  You have chills.   You have chest pain.   You have worsening shortness of breath.   You have bloody sputum.  You faint.  You have lightheadedness.  You have a severe headache.   You vomit repeatedly. MAKE SURE YOU:   Understand these instructions.  Will watch your condition.  Will get help right away if you are not doing well or get worse. Document Released: 03/04/2005 Document Revised: 12/23/2012 Document Reviewed: 10/27/2012 [facility] Patient Information 2014 [place].

## 2013-05-28 NOTE — ED Provider Notes (Signed)
Medical screening examination/treatment/procedure(s) were performed by non-physician practitioner and as supervising physician I was immediately available for consultation/collaboration.   EKG Interpretation None       Varney Biles, MD 05/28/13 1906

## 2013-11-06 ENCOUNTER — Encounter (HOSPITAL_COMMUNITY): Payer: Self-pay | Admitting: Emergency Medicine

## 2013-11-06 ENCOUNTER — Inpatient Hospital Stay (HOSPITAL_COMMUNITY)
Admission: EM | Admit: 2013-11-06 | Discharge: 2013-11-10 | DRG: 392 | Disposition: A | Discharge status: Home / Self Care | Payer: Self-pay | Attending: Internal Medicine | Admitting: Internal Medicine

## 2013-11-06 ENCOUNTER — Emergency Department (HOSPITAL_COMMUNITY): Payer: BC Managed Care – PPO

## 2013-11-06 DIAGNOSIS — E876 Hypokalemia: Secondary | ICD-10-CM | POA: Diagnosis present

## 2013-11-06 DIAGNOSIS — Z720 Tobacco use: Secondary | ICD-10-CM

## 2013-11-06 DIAGNOSIS — K5732 Diverticulitis of large intestine without perforation or abscess without bleeding: Principal | ICD-10-CM | POA: Diagnosis present

## 2013-11-06 DIAGNOSIS — K5792 Diverticulitis of intestine, part unspecified, without perforation or abscess without bleeding: Secondary | ICD-10-CM | POA: Diagnosis present

## 2013-11-06 DIAGNOSIS — N179 Acute kidney failure, unspecified: Secondary | ICD-10-CM | POA: Diagnosis present

## 2013-11-06 DIAGNOSIS — I10 Essential (primary) hypertension: Secondary | ICD-10-CM | POA: Diagnosis present

## 2013-11-06 DIAGNOSIS — F17200 Nicotine dependence, unspecified, uncomplicated: Secondary | ICD-10-CM | POA: Diagnosis present

## 2013-11-06 DIAGNOSIS — R112 Nausea with vomiting, unspecified: Secondary | ICD-10-CM | POA: Diagnosis present

## 2013-11-06 DIAGNOSIS — R0789 Other chest pain: Secondary | ICD-10-CM | POA: Diagnosis present

## 2013-11-06 DIAGNOSIS — R011 Cardiac murmur, unspecified: Secondary | ICD-10-CM | POA: Diagnosis present

## 2013-11-06 LAB — URINALYSIS, ROUTINE W REFLEX MICROSCOPIC
Bilirubin Urine: NEGATIVE
Glucose, UA: NEGATIVE mg/dL
Hgb urine dipstick: NEGATIVE
Ketones, ur: NEGATIVE mg/dL
Leukocytes, UA: NEGATIVE
Nitrite: NEGATIVE
Protein, ur: NEGATIVE mg/dL
Specific Gravity, Urine: 1.021 (ref 1.005–1.030)
Urobilinogen, UA: 1 mg/dL (ref 0.0–1.0)
pH: 7 (ref 5.0–8.0)

## 2013-11-06 LAB — CBC WITH DIFFERENTIAL/PLATELET
BASOS PCT: 0 % (ref 0–1)
Basophils Absolute: 0 10*3/uL (ref 0.0–0.1)
EOS ABS: 0.2 10*3/uL (ref 0.0–0.7)
Eosinophils Relative: 3 % (ref 0–5)
HCT: 37.3 % (ref 36.0–46.0)
Hemoglobin: 12.1 g/dL (ref 12.0–15.0)
LYMPHS ABS: 2.3 10*3/uL (ref 0.7–4.0)
Lymphocytes Relative: 29 % (ref 12–46)
MCH: 29.4 pg (ref 26.0–34.0)
MCHC: 32.4 g/dL (ref 30.0–36.0)
MCV: 90.8 fL (ref 78.0–100.0)
MONOS PCT: 7 % (ref 3–12)
Monocytes Absolute: 0.5 10*3/uL (ref 0.1–1.0)
NEUTROS PCT: 61 % (ref 43–77)
Neutro Abs: 4.8 10*3/uL (ref 1.7–7.7)
PLATELETS: 419 10*3/uL — AB (ref 150–400)
RBC: 4.11 MIL/uL (ref 3.87–5.11)
RDW: 14.9 % (ref 11.5–15.5)
WBC: 7.9 10*3/uL (ref 4.0–10.5)

## 2013-11-06 LAB — COMPREHENSIVE METABOLIC PANEL
ALBUMIN: 3.3 g/dL — AB (ref 3.5–5.2)
ALT: 13 U/L (ref 0–35)
ANION GAP: 12 (ref 5–15)
AST: 12 U/L (ref 0–37)
Alkaline Phosphatase: 72 U/L (ref 39–117)
BUN: 9 mg/dL (ref 6–23)
CALCIUM: 9.1 mg/dL (ref 8.4–10.5)
CO2: 24 mEq/L (ref 19–32)
Chloride: 105 mEq/L (ref 96–112)
Creatinine, Ser: 1.12 mg/dL — ABNORMAL HIGH (ref 0.50–1.10)
GFR calc non Af Amer: 53 mL/min — ABNORMAL LOW (ref >90)
GFR, EST AFRICAN AMERICAN: 62 mL/min — AB (ref >90)
GLUCOSE: 106 mg/dL — AB (ref 70–99)
POTASSIUM: 3.5 meq/L — AB (ref 3.7–5.3)
SODIUM: 141 meq/L (ref 137–147)
Total Bilirubin: 0.3 mg/dL (ref 0.3–1.2)
Total Protein: 7 g/dL (ref 6.0–8.3)

## 2013-11-06 LAB — LIPASE, BLOOD: Lipase: 22 U/L (ref 11–59)

## 2013-11-06 LAB — TROPONIN I

## 2013-11-06 MED ORDER — HYDROCODONE-ACETAMINOPHEN 5-325 MG PO TABS
1.0000 | ORAL_TABLET | ORAL | Status: DC | PRN
  Administered 2013-11-08: 1 via ORAL
  Administered 2013-11-09: 2 via ORAL
  Administered 2013-11-09: 1 via ORAL
  Filled 2013-11-06: qty 2
  Filled 2013-11-06 (×2): qty 1

## 2013-11-06 MED ORDER — SODIUM CHLORIDE 0.9 % IV BOLUS (SEPSIS)
1000.0000 mL | Freq: Once | INTRAVENOUS | Status: AC
  Administered 2013-11-06: 1000 mL via INTRAVENOUS

## 2013-11-06 MED ORDER — SODIUM CHLORIDE 0.9 % IV SOLN
INTRAVENOUS | Status: AC
  Administered 2013-11-06: 22:00:00 via INTRAVENOUS

## 2013-11-06 MED ORDER — POTASSIUM CHLORIDE 10 MEQ/100ML IV SOLN
10.0000 meq | INTRAVENOUS | Status: DC
  Administered 2013-11-06: 10 meq via INTRAVENOUS
  Filled 2013-11-06 (×2): qty 100

## 2013-11-06 MED ORDER — IOHEXOL 300 MG/ML  SOLN
50.0000 mL | Freq: Once | INTRAMUSCULAR | Status: AC | PRN
  Administered 2013-11-06: 50 mL via ORAL

## 2013-11-06 MED ORDER — ONDANSETRON HCL 4 MG PO TABS: 4.0000 mg | ORAL_TABLET | Freq: Four times a day (QID) | ORAL | Status: DC | PRN

## 2013-11-06 MED ORDER — ACETAMINOPHEN 325 MG PO TABS: 650.0000 mg | ORAL_TABLET | Freq: Four times a day (QID) | ORAL | Status: DC | PRN

## 2013-11-06 MED ORDER — METRONIDAZOLE IN NACL 5-0.79 MG/ML-% IV SOLN
500.0000 mg | Freq: Once | INTRAVENOUS | Status: AC
  Administered 2013-11-06: 500 mg via INTRAVENOUS
  Filled 2013-11-06: qty 100

## 2013-11-06 MED ORDER — MORPHINE SULFATE 4 MG/ML IJ SOLN
4.0000 mg | Freq: Once | INTRAMUSCULAR | Status: AC
  Administered 2013-11-06: 4 mg via INTRAVENOUS
  Filled 2013-11-06: qty 1

## 2013-11-06 MED ORDER — ONDANSETRON HCL 4 MG/2ML IJ SOLN
4.0000 mg | Freq: Four times a day (QID) | INTRAMUSCULAR | Status: DC | PRN
  Administered 2013-11-06 – 2013-11-07 (×2): 4 mg via INTRAVENOUS
  Filled 2013-11-06: qty 2

## 2013-11-06 MED ORDER — ENOXAPARIN SODIUM 40 MG/0.4ML ~~LOC~~ SOLN
40.0000 mg | Freq: Every day | SUBCUTANEOUS | Status: DC
  Administered 2013-11-06 – 2013-11-09 (×4): 40 mg via SUBCUTANEOUS
  Filled 2013-11-06 (×5): qty 0.4

## 2013-11-06 MED ORDER — MORPHINE SULFATE 4 MG/ML IJ SOLN: 4.0000 mg | INTRAMUSCULAR | Status: DC | PRN

## 2013-11-06 MED ORDER — ONDANSETRON HCL 4 MG/2ML IJ SOLN
INTRAMUSCULAR | Status: AC
  Filled 2013-11-06: qty 2

## 2013-11-06 MED ORDER — CIPROFLOXACIN IN D5W 400 MG/200ML IV SOLN
400.0000 mg | Freq: Two times a day (BID) | INTRAVENOUS | Status: DC
  Administered 2013-11-07 – 2013-11-08 (×3): 400 mg via INTRAVENOUS
  Filled 2013-11-06 (×3): qty 200

## 2013-11-06 MED ORDER — IOHEXOL 300 MG/ML  SOLN
100.0000 mL | Freq: Once | INTRAMUSCULAR | Status: AC | PRN
  Administered 2013-11-06: 100 mL via INTRAVENOUS

## 2013-11-06 MED ORDER — CIPROFLOXACIN IN D5W 400 MG/200ML IV SOLN
400.0000 mg | Freq: Once | INTRAVENOUS | Status: DC
  Administered 2013-11-06: 400 mg via INTRAVENOUS
  Filled 2013-11-06: qty 200

## 2013-11-06 MED ORDER — ONDANSETRON HCL 4 MG/2ML IJ SOLN
4.0000 mg | Freq: Once | INTRAMUSCULAR | Status: AC
  Administered 2013-11-06: 4 mg via INTRAVENOUS
  Filled 2013-11-06: qty 2

## 2013-11-06 MED ORDER — METRONIDAZOLE IN NACL 5-0.79 MG/ML-% IV SOLN
500.0000 mg | Freq: Three times a day (TID) | INTRAVENOUS | Status: DC
  Administered 2013-11-07 – 2013-11-08 (×5): 500 mg via INTRAVENOUS
  Filled 2013-11-06 (×6): qty 100

## 2013-11-06 MED ORDER — DOCUSATE SODIUM 100 MG PO CAPS
100.0000 mg | ORAL_CAPSULE | Freq: Two times a day (BID) | ORAL | Status: DC
  Administered 2013-11-07 – 2013-11-10 (×7): 100 mg via ORAL
  Filled 2013-11-06 (×9): qty 1

## 2013-11-06 MED ORDER — ACETAMINOPHEN 650 MG RE SUPP: 650.0000 mg | Freq: Four times a day (QID) | RECTAL | Status: DC | PRN

## 2013-11-06 NOTE — ED Provider Notes (Signed)
Medical screening examination/treatment/procedure(s) were conducted as a shared visit with non-physician practitioner(s) and myself.  I personally evaluated the patient during the encounter.   EKG Interpretation None     Patient requests admission for diverticulitis; moderately tender left lower quadrant with no rebound.  Babette Relic, MD 11/16/13 [phone]

## 2013-11-06 NOTE — ED Notes (Signed)
Pt c/o low abd cramping.  Likens it to menstrual cramps however pt is post menopausal.  Hx of diverticulitis.  Pt states she has been having NVD.  Sx x 3 days.

## 2013-11-06 NOTE — H&P (Signed)
PCP:  None so far but is scheduled to see someone   Chief Complaint:  Abdominal pain  HPI: Charlotte Garcia is a 58 y.o. female   has a past medical history of Hypertension; Hypokalemia; Murmur, heart; Bronchitis; and Tobacco abuse.   Presented with  Patient had a previous episode of diverticulitis 8 months ago. For the past 1 week she developed abdominal pain and low grade fever. She presented to ER. CT scan was done showing diverticulitis. Patient endorses nausea but not vomiting feels she is unable to tolerate PO. In ER she was started on IV cipro and flagyl. Last week she have had small amount of blood on the tissue paper but no significant bleeding she has hx of hemorrhoids currently this has resolved.   Of note few days ago she felt like both of her breast felt enlarged and she had a an episode of chest and back pain lasting 2-3 min while she was at work. She states she gets chest pain sometimes when she lifts heavy objects but not with walking.   Hospitalist was called for admission for Diverticultis  Review of Systems:    Pertinent positives include:   Fevers, chills, fatigue, abdominal pain, nausea, chest pain,  Constitutional:  No weight loss, night sweats,weight loss  HEENT:  No headaches, Difficulty swallowing,Tooth/dental problems,Sore throat,  No sneezing, itching, ear ache, nasal congestion, post nasal drip,  Cardio-vascular:  No  Orthopnea, PND, anasarca, dizziness, palpitations.no Bilateral lower extremity swelling  GI:  No heartburn, indigestion, vomiting, diarrhea, change in bowel habits, loss of appetite, melena, blood in stool, hematemesis Resp:  no shortness of breath at rest. No dyspnea on exertion, No excess mucus, no productive cough, No non-productive cough, No coughing up of blood.No change in color of mucus.No wheezing. Skin:  no rash or lesions. No jaundice GU:  no dysuria, change in color of urine, no urgency or frequency. No straining to urinate.    No flank pain.  Musculoskeletal:  No joint pain or no joint swelling. No decreased range of motion. No back pain.  Psych:  No change in mood or affect. No depression or anxiety. No memory loss.  Neuro: no localizing neurological complaints, no tingling, no weakness, no double vision, no gait abnormality, no slurred speech, no confusion  Otherwise ROS are negative except for above, 10 systems were reviewed  Past Medical History: Past Medical History  Diagnosis Date  . Hypertension   . Hypokalemia   . Murmur, heart   . Bronchitis   . Tobacco abuse    Past Surgical History  Procedure Laterality Date  . Wrist surgery      gangular cyst  . Cystectomy      forehead; back of head  . Dilation and curettage of uterus       Medications: Prior to Admission medications   Medication Sig Start Date End Date Taking? Authorizing Provider  acetaminophen (TYLENOL) 500 MG tablet Take 1,000 mg by mouth every 8 (eight) hours as needed for mild pain or headache.   Yes Historical Provider, MD  ibuprofen (ADVIL,MOTRIN) 200 MG tablet Take 200 mg by mouth every 6 (six) hours as needed for moderate pain.   Yes Historical Provider, MD    Allergies:   Allergies  Allergen Reactions  . Sulfa Antibiotics Hives  . Codeine Nausea And Vomiting    Social History:  Ambulatory  independently   Lives at home With family     reports that she has been smoking Cigarettes.  She has a 10 pack-year smoking history. She has never used smokeless tobacco. She reports that she drinks alcohol. She reports that she does not use illicit drugs.    Family History: family history includes Heart disease in her father and mother; Hypertension in her father and mother; Renal Disease in her brother.    Physical Exam: Patient Vitals for the past 24 hrs:  BP Temp Temp src Pulse Resp SpO2  11/06/13 1912 158/102 mmHg 98.1 F (36.7 C) Oral 76 16 97 %  11/06/13 1357 141/99 mmHg 98.4 F (36.9 C) Oral 82 18 100 %     1. General:  in No Acute distress 2. Psychological: Alert and  Oriented 3. Head/ENT:   Moist  Mucous Membranes                          Head Non traumatic, neck supple                          Normal  Dentition 4. SKIN: normal   Skin turgor,  Skin clean Dry and intact no rash 5. Heart: Regular rate and rhythm no Murmur, Rub or gallop 6. Lungs: Clear to auscultation bilaterally, no wheezes or crackles   7. Abdomen: Soft,  Tender in left lower quadrant no rebound, Non distended 8. Lower extremities: no clubbing, cyanosis, or edema 9. Neurologically Grossly intact, moving all 4 extremities equally 10. MSK: Normal range of motion  body mass index is unknown because there is no weight on file.   Labs on Admission:   Recent Labs  11/06/13 1412  NA 141  K 3.5*  CL 105  CO2 24  GLUCOSE 106*  BUN 9  CREATININE 1.12*  CALCIUM 9.1    Recent Labs  11/06/13 1412  AST 12  ALT 13  ALKPHOS 72  BILITOT 0.3  PROT 7.0  ALBUMIN 3.3*    Recent Labs  11/06/13 1412  LIPASE 22    Recent Labs  11/06/13 1412  WBC 7.9  NEUTROABS 4.8  HGB 12.1  HCT 37.3  MCV 90.8  PLT 419*   No results found for this basename: CKTOTAL, CKMB, CKMBINDEX, TROPONINI,  in the last 72 hours No results found for this basename: TSH, T4TOTAL, FREET3, T3FREE, THYROIDAB,  in the last 72 hours No results found for this basename: VITAMINB12, FOLATE, FERRITIN, TIBC, IRON, RETICCTPCT,  in the last 72 hours No results found for this basename: HGBA1C    The CrCl is unknown because both a height and weight (above a minimum accepted value) are required for this calculation. ABG    Component Value Date/Time   TCO2 25 05/27/2013 2300     No results found for this basename: DDIMER     Other results:  UA concentrated  BNP (last 3 results) No results found for this basename: PROBNP,  in the last 8760 hours  There were no vitals filed for this visit.   Cultures: No results found for this basename:  sdes, specrequest, cult, reptstatus      Radiological Exams on Admission: Ct Abdomen Pelvis W Contrast  11/06/2013   CLINICAL DATA:  Lower abdominal cramping ; history of diverticulitis ; nausea vomiting and diarrhea for 3 days  EXAM: CT ABDOMEN AND PELVIS WITH CONTRAST  TECHNIQUE: Multidetector CT imaging of the abdomen and pelvis was performed using the standard protocol following bolus administration of intravenous contrast.  CONTRAST:  39mL OMNIPAQUE IOHEXOL  300 MG/ML SOLN, 142mL OMNIPAQUE IOHEXOL 300 MG/ML SOLN  COMPARISON:  CT scan of the abdomen and pelvis dated February 27, 2013.  FINDINGS: There are inflammatory changes associated with the distal descending colon and proximal rectosigmoid consistent with acute diverticulitis. There is a small amount of preicolonic inflammatory change, but there is no abscess or free air. More proximally the colon exhibits no acute abnormality. The stomach is distended with food as well as contrast. The small-bowel exhibits no acute abnormality.  The liver, gallbladder, pancreas, adrenal glands, and diminutive spleen are unremarkable. There is an approximately 1 x 1.5 cm accessory spleen. The caliber of the abdominal aorta is normal. The kidneys are normal. The urinary bladder, uterus, and adnexal structures are unremarkable.  The lung bases exhibit mild compressive atelectasis. The lumbar spine the and bony pelvis exhibit no acute abnormalities. There is degenerative disc change at T11-T12 and T12-L1.  IMPRESSION: 1. Acute distal descending colonic and proximal sigmoid diverticulitis without abscess, perforation, or obstruction. 2. There is no acute hepatobiliary nor acute urinary tract abnormality.   Electronically Signed   By: David  Martinique   On: 11/06/2013 19:14    Chart has been reviewed  Assessment/Plan  58 yo F with hx of HTN and Tobacco abuse here with diverticulitis  Present on Admission:  . Diverticulitis - cont flagyl and cipro, clear diet, pain  control. IVF . Tobacco abuse - spoke about importance of quiting . Hypertension - not currently on any medications, elevated BP in the setting of pain. Will need to address as an outpatient if persists.  hypokalemia - replace Hx of atypical non exertional pain - currently resolved per hx most likely musculoskeletal, will check lipid panel, baseline ECG patient will need to establish care with PCP  Prophylaxis:   Lovenox, Protonix  CODE STATUS:  FULL CODE   Other plan as per orders.  I have spent a total of 55 min on this admission  [place] Neuroth 11/06/2013, 8:29 PM  [facility]  Pager [phone]   If 7AM-7PM, please contact the day team taking care of the patient  [email or web address]  Password [email or web address]

## 2013-11-06 NOTE — ED Provider Notes (Signed)
CSN: [id number]     Arrival date & time 11/06/13  1348 History   First MD Initiated Contact with Patient 11/06/13 1653     Chief Complaint  Patient presents with  . Abdominal Pain  . Nausea  . Emesis  . Diarrhea     (Consider location/radiation/quality/duration/timing/severity/associated sxs/prior Treatment) The history is provided by the patient and medical records.   This is a 58 y.o. F with PMH significant for HTN, diverticulitis, presenting to the ED for abdominal pain x3 days. Patient states pain is similar to menstrual cramps, however is much more intense. She states pain is occurring in her left lower quadrant with radiation around her left hip and into her back. She endorses associated nausea, vomiting, and diarrhea.  States she has noticed trace amounts of blood in her stool yesterday, none today.  No hematemesis.  Endorses fever of 101F last night, relieved with tylenol.  Most recent diverticulitis occurrence was in December, spent a week in the hospital on IV abx.  No noted abscess or perforation.  Patient has never had a colonoscopy.  VS satble on arrival.  Past Medical History  Diagnosis Date  . Hypertension   . Hypokalemia   . Murmur, heart   . Bronchitis   . Tobacco abuse    Past Surgical History  Procedure Laterality Date  . Wrist surgery      gangular cyst  . Cystectomy      forehead; back of head  . Dilation and curettage of uterus     Family History  Problem Relation Age of Onset  . Hypertension Father   . Heart disease Father   . Hypertension Mother   . Heart disease Mother   . Renal Disease Brother    History  Substance Use Topics  . Smoking status: Current Some Day Smoker -- 0.50 packs/day for 20 years    Types: Cigarettes  . Smokeless tobacco: Never Used  . Alcohol Use: Yes   OB History   Grav Para Term Preterm Abortions TAB SAB Ect Mult Living   3 1 1  2  2   1      Review of Systems  Gastrointestinal: Positive for nausea, vomiting,  abdominal pain and diarrhea.  All other systems reviewed and are negative.     Allergies  Sulfa antibiotics and Codeine  Home Medications   Prior to Admission medications   Medication Sig Start Date End Date Taking? Authorizing Provider  acetaminophen (TYLENOL) 500 MG tablet Take 1,000 mg by mouth every 8 (eight) hours as needed for mild pain or headache.   Yes Historical Provider, MD  ibuprofen (ADVIL,MOTRIN) 200 MG tablet Take 200 mg by mouth every 6 (six) hours as needed for moderate pain.   Yes Historical Provider, MD   BP 141/99  Pulse 82  Temp(Src) 98.4 F (36.9 C) (Oral)  Resp 18  SpO2 100%  Physical Exam  Nursing note and vitals reviewed. Constitutional: She is oriented to person, place, and time. She appears well-developed and well-nourished. No distress.  HENT:  Head: Normocephalic and atraumatic.  Mouth/Throat: Oropharynx is clear and moist.  Mildly dry mucous membranes  Eyes: Conjunctivae and EOM are normal. Pupils are equal, round, and reactive to light.  Neck: Normal range of motion. Neck supple.  Cardiovascular: Normal rate, regular rhythm and normal heart sounds.   Pulmonary/Chest: Effort normal and breath sounds normal. No respiratory distress. She has no wheezes.  Abdominal: Soft. Bowel sounds are normal. There is tenderness in the left  lower quadrant. There is guarding. There is no rigidity and no CVA tenderness.  Abdomen soft, non-distended, TTP in LLQ with voluntary guarding  Musculoskeletal: Normal range of motion.  Neurological: She is alert and oriented to person, place, and time.  Skin: Skin is warm and dry. She is not diaphoretic.  Psychiatric: She has a normal mood and affect.    ED Course  Procedures (including critical care time) Labs Review Labs Reviewed  CBC WITH DIFFERENTIAL - Abnormal; Notable for the following:    Platelets 419 (*)    All other components within normal limits  COMPREHENSIVE METABOLIC PANEL - Abnormal; Notable for the  following:    Potassium 3.5 (*)    Glucose, Bld 106 (*)    Creatinine, Ser 1.12 (*)    Albumin 3.3 (*)    GFR calc non Af Amer 53 (*)    GFR calc Af Amer 62 (*)    All other components within normal limits  LIPASE, BLOOD  URINALYSIS, ROUTINE W REFLEX MICROSCOPIC    Imaging Review Ct Abdomen Pelvis W Contrast  11/06/2013   CLINICAL DATA:  Lower abdominal cramping ; history of diverticulitis ; nausea vomiting and diarrhea for 3 days  EXAM: CT ABDOMEN AND PELVIS WITH CONTRAST  TECHNIQUE: Multidetector CT imaging of the abdomen and pelvis was performed using the standard protocol following bolus administration of intravenous contrast.  CONTRAST:  91mL OMNIPAQUE IOHEXOL 300 MG/ML SOLN, 174mL OMNIPAQUE IOHEXOL 300 MG/ML SOLN  COMPARISON:  CT scan of the abdomen and pelvis dated February 27, 2013.  FINDINGS: There are inflammatory changes associated with the distal descending colon and proximal rectosigmoid consistent with acute diverticulitis. There is a small amount of preicolonic inflammatory change, but there is no abscess or free air. More proximally the colon exhibits no acute abnormality. The stomach is distended with food as well as contrast. The small-bowel exhibits no acute abnormality.  The liver, gallbladder, pancreas, adrenal glands, and diminutive spleen are unremarkable. There is an approximately 1 x 1.5 cm accessory spleen. The caliber of the abdominal aorta is normal. The kidneys are normal. The urinary bladder, uterus, and adnexal structures are unremarkable.  The lung bases exhibit mild compressive atelectasis. The lumbar spine the and bony pelvis exhibit no acute abnormalities. There is degenerative disc change at T11-T12 and T12-L1.  IMPRESSION: 1. Acute distal descending colonic and proximal sigmoid diverticulitis without abscess, perforation, or obstruction. 2. There is no acute hepatobiliary nor acute urinary tract abnormality.   Electronically Signed   By: David  [place]   On:  11/06/2013 19:14     EKG Interpretation None      MDM   Final diagnoses:  Diverticulitis of large intestine without perforation or abscess without bleeding   58 y.o. With abdominal pain. Hx diverticulitis with similar sx, most recent episode was 02/2013.  On exam, tenderness of LLQ without peritonitis.  Lab work with no leukocytosis, slight increase in SrCr, likely dehydration component.  Based on hx of exam findings, pt likely with recurrent diverticulitis but will obtain CT abdomen/pelvis to r/o perforation or abscess.  CT revealing descending and proximal sigmoid diverticulitis, no abscess or perforation.  Discussed with patient possibilities of IP vs OP treatment-- states last time she was very sick and is hesitant to try OP treatment for fear of worsening condition.  Patient started on IV cipro and flagyl.  Case discussed with hospitalist service who will admit for further management.  Larene Pickett, PA-C 11/07/13 [phone]

## 2013-11-07 DIAGNOSIS — N179 Acute kidney failure, unspecified: Secondary | ICD-10-CM

## 2013-11-07 DIAGNOSIS — E876 Hypokalemia: Secondary | ICD-10-CM

## 2013-11-07 LAB — COMPREHENSIVE METABOLIC PANEL
ALK PHOS: 63 U/L (ref 39–117)
ALT: 12 U/L (ref 0–35)
ANION GAP: 11 (ref 5–15)
AST: 10 U/L (ref 0–37)
Albumin: 3 g/dL — ABNORMAL LOW (ref 3.5–5.2)
BILIRUBIN TOTAL: 0.3 mg/dL (ref 0.3–1.2)
BUN: 7 mg/dL (ref 6–23)
CO2: 24 mEq/L (ref 19–32)
Calcium: 8.8 mg/dL (ref 8.4–10.5)
Chloride: 107 mEq/L (ref 96–112)
Creatinine, Ser: 0.67 mg/dL (ref 0.50–1.10)
GFR calc non Af Amer: >90 mL/min (ref >90)
Glucose, Bld: 106 mg/dL — ABNORMAL HIGH (ref 70–99)
POTASSIUM: 3.6 meq/L — AB (ref 3.7–5.3)
SODIUM: 142 meq/L (ref 137–147)
TOTAL PROTEIN: 6.3 g/dL (ref 6.0–8.3)

## 2013-11-07 LAB — PHOSPHORUS: Phosphorus: 3.8 mg/dL (ref 2.3–4.6)

## 2013-11-07 LAB — CBC
HEMATOCRIT: 33.6 % — AB (ref 36.0–46.0)
HEMOGLOBIN: 11.4 g/dL — AB (ref 12.0–15.0)
MCH: 30.5 pg (ref 26.0–34.0)
MCHC: 33.9 g/dL (ref 30.0–36.0)
MCV: 89.8 fL (ref 78.0–100.0)
Platelets: 424 10*3/uL — ABNORMAL HIGH (ref 150–400)
RBC: 3.74 MIL/uL — ABNORMAL LOW (ref 3.87–5.11)
RDW: 14.8 % (ref 11.5–15.5)
WBC: 8.6 10*3/uL (ref 4.0–10.5)

## 2013-11-07 LAB — LIPID PANEL
CHOL/HDL RATIO: 2.6 ratio
Cholesterol: 165 mg/dL (ref 0–200)
HDL: 63 mg/dL (ref >39)
LDL Cholesterol: 83 mg/dL (ref 0–99)
TRIGLYCERIDES: 97 mg/dL (ref <150)
VLDL: 19 mg/dL (ref 0–40)

## 2013-11-07 LAB — MAGNESIUM: MAGNESIUM: 2 mg/dL (ref 1.5–2.5)

## 2013-11-07 LAB — TSH: TSH: 3.75 u[IU]/mL (ref 0.350–4.500)

## 2013-11-07 MED ORDER — HYDRALAZINE HCL 20 MG/ML IJ SOLN: 5.0000 mg | Freq: Four times a day (QID) | INTRAMUSCULAR | Status: DC | PRN

## 2013-11-07 MED ORDER — POTASSIUM CHLORIDE CRYS ER 20 MEQ PO TBCR
40.0000 meq | EXTENDED_RELEASE_TABLET | Freq: Once | ORAL | Status: AC
  Administered 2013-11-07: 40 meq via ORAL
  Filled 2013-11-07 (×2): qty 2

## 2013-11-07 MED ORDER — HYDROMORPHONE HCL PF 1 MG/ML IJ SOLN
1.0000 mg | INTRAMUSCULAR | Status: DC | PRN
  Administered 2013-11-07 – 2013-11-10 (×11): 1 mg via INTRAVENOUS
  Filled 2013-11-07 (×13): qty 1

## 2013-11-07 MED ORDER — SODIUM CHLORIDE 0.9 % IV SOLN
INTRAVENOUS | Status: AC
  Administered 2013-11-07: 19:00:00 via INTRAVENOUS

## 2013-11-07 MED ORDER — ONDANSETRON HCL 4 MG/2ML IJ SOLN
4.0000 mg | Freq: Four times a day (QID) | INTRAMUSCULAR | Status: DC | PRN
  Administered 2013-11-08 – 2013-11-10 (×4): 4 mg via INTRAVENOUS
  Filled 2013-11-07 (×4): qty 2

## 2013-11-07 MED ORDER — PROMETHAZINE HCL 25 MG/ML IJ SOLN
25.0000 mg | INTRAMUSCULAR | Status: DC | PRN
  Administered 2013-11-07 – 2013-11-09 (×5): 25 mg via INTRAVENOUS
  Filled 2013-11-07 (×6): qty 1

## 2013-11-07 NOTE — Progress Notes (Signed)
Patient ID: Charlotte Garcia, female   DOB: 11-Apr-1955, 58 y.o.   MRN: [id number] [facility] PROGRESS NOTE  Charlotte Garcia:[id number] DOB: 1955/04/28 DOA: 11/06/2013 PCP: No primary provider on file.  Brief narrative: 58 y.o. female with past medical history of hypertension, smoking who presented to [facility] ED 11/06/2013 with reports of abdominal pain and low grade fevers at home. In ED, she was found to have acute diverticulitis and was started on cipro and flagyl. In addition, she had initial complaints of chest pain which is resolved at this time. Her troponin was WNL on admission.  Assessment and Plan:    Principal Problem: Acute distal descending and proximal sigmoid diverticulitis   As evidenced on CT abdomen; without the perforation or an abscess  Continue cipro and flagyl  Clear liquid diet  Analgesia with dilaudid 1 mg every 4 hours IV PRN severe pain and norco PO PRN moderate pain  Antiemetics as needed Active Problems: Acute renal failure  Likely dehydration  Improved to normal with IV fluids Hypokalemia  Secondary to GI losses  repleted  Follow up BMP in am Chest pain  Likely musculoskeletal. Troponin was WNL on admission. The 12 lead EKG showed normal sinus rhythm. DVT prophylaxis  Lovenox SQ while pt is in hospital  Code Status: Full Family Communication: Pt at bedside Disposition Plan: Home when medically stable   IV Access:   Peripheral IV Procedures and diagnostic studies:   Ct Abdomen Pelvis W Contrast  11/06/2013  1. Acute distal descending colonic and proximal sigmoid diverticulitis without abscess, perforation, or obstruction. 2. There is no acute hepatobiliary nor acute urinary tract abnormality.    Medical Consultants:   None  Other Consultants:   Physical therapy  Anti-Infectives:   Cipro 11/06/2013 --> Cirpofloxacin 11/06/2013 -->  Faye Ramsay, MD  [facility] Pager [phone]  If 7PM-7AM, please contact  night-coverage [email or web address] Password [email or web address] 11/07/2013, 2:25 PM   LOS: 1 day   HPI/Subjective: No events overnight.   Objective: Filed Vitals:   11/06/13 2213 11/07/13 0211 11/07/13 0631 11/07/13 0929  BP: 154/88 148/78 128/65 148/89  Pulse: 70 68 70 67  Temp: 98.4 F (36.9 C) 98.5 F (36.9 C) 98.5 F (36.9 C) 97.7 F (36.5 C)  TempSrc: Oral Oral Oral Oral  Resp: 16 16 18 18   Height: 5\' 2"  (1.575 m)     Weight: 58.06 kg (128 lb)     SpO2: 96% 98% 98% 98%    Intake/Output Summary (Last 24 hours) at 11/07/13 1425 Last data filed at 11/07/13 1355  Gross per 24 hour  Intake 1686.25 ml  Output   1850 ml  Net -163.75 ml    Exam:   General:  Pt is alert, follows commands appropriately, not in acute distress  Cardiovascular: Regular rate and rhythm, S1/S2, no murmurs, no rubs, no gallops  Respiratory: Clear to auscultation bilaterally, no wheezing, no crackles, no rhonchi  Abdomen: Soft, tender in mid and lower abdomen to palpation, non distended, bowel sounds present, no guarding  Extremities: No edema, pulses DP and PT palpable bilaterally  Neuro: Grossly nonfocal  Data Reviewed: Basic Metabolic Panel:  Recent Labs Lab 11/06/13 1412 11/07/13 0600  NA 141 142  K 3.5* 3.6*  CL 105 107  CO2 24 24  GLUCOSE 106* 106*  BUN 9 7  CREATININE 1.12* 0.67  CALCIUM 9.1 8.8  MG  --  2.0  PHOS  --  3.8   Liver Function Tests:  Recent Labs Lab 11/06/13 1412  11/07/13 0600  AST 12 10  ALT 13 12  ALKPHOS 72 63  BILITOT 0.3 0.3  PROT 7.0 6.3  ALBUMIN 3.3* 3.0*    Recent Labs Lab 11/06/13 1412  LIPASE 22   No results found for this basename: AMMONIA,  in the last 168 hours CBC:  Recent Labs Lab 11/06/13 1412 11/07/13 0600  WBC 7.9 8.6  NEUTROABS 4.8  --   HGB 12.1 11.4*  HCT 37.3 33.6*  MCV 90.8 89.8  PLT 419* 424*   Cardiac Enzymes:  Recent Labs Lab 11/06/13 2144  TROPONINI <0.30   BNP: No components found with this basename: POCBNP,   CBG: No results found for this basename: GLUCAP,  in the last 168 hours  No results found for this or any previous visit (from the past 240 hour(s)).   Scheduled Meds: . ciprofloxacin  400 mg Intravenous BID  . docusate sodium  100 mg Oral BID  . enoxaparin (LOVENOX)   40 mg Subcutaneous QHS  . metronidazole  500 mg Intravenous 3 times per day

## 2013-11-08 MED ORDER — CIPROFLOXACIN HCL 500 MG PO TABS
500.0000 mg | ORAL_TABLET | Freq: Two times a day (BID) | ORAL | Status: DC
  Administered 2013-11-08 – 2013-11-10 (×4): 500 mg via ORAL
  Filled 2013-11-08 (×6): qty 1

## 2013-11-08 MED ORDER — POTASSIUM CHLORIDE CRYS ER 20 MEQ PO TBCR
40.0000 meq | EXTENDED_RELEASE_TABLET | Freq: Once | ORAL | Status: AC
  Administered 2013-11-08: 40 meq via ORAL
  Filled 2013-11-08: qty 2

## 2013-11-08 MED ORDER — CALCIUM CARBONATE ANTACID 500 MG PO CHEW
1000.0000 mg | CHEWABLE_TABLET | Freq: Three times a day (TID) | ORAL | Status: DC | PRN
  Administered 2013-11-08: 1000 mg via ORAL
  Filled 2013-11-08: qty 2

## 2013-11-08 MED ORDER — HYDROCORTISONE 1 % EX CREA
TOPICAL_CREAM | Freq: Four times a day (QID) | CUTANEOUS | Status: DC
  Administered 2013-11-08 – 2013-11-10 (×5): via TOPICAL
  Filled 2013-11-08: qty 28

## 2013-11-08 MED ORDER — SODIUM CHLORIDE 0.9 % IV SOLN
INTRAVENOUS | Status: AC
  Administered 2013-11-09 (×2): via INTRAVENOUS

## 2013-11-08 MED ORDER — METRONIDAZOLE 500 MG PO TABS
500.0000 mg | ORAL_TABLET | Freq: Three times a day (TID) | ORAL | Status: DC
  Administered 2013-11-08 – 2013-11-10 (×5): 500 mg via ORAL
  Filled 2013-11-08 (×9): qty 1

## 2013-11-08 NOTE — Progress Notes (Signed)
Patient ID: Charlotte Garcia, female   DOB: 20-May-1955, 58 y.o.   MRN: [id number]  [facility] PROGRESS NOTE  Charlotte Garcia:[id number] DOB: Aug 02, 1955 DOA: 11/06/2013 PCP: No primary provider on file.  Brief narrative:  58 y.o. female with past medical history of hypertension, smoking who presented to [facility] ED 11/06/2013 with reports of abdominal pain and low grade fevers at home. In ED, she was found to have acute diverticulitis and was started on cipro and flagyl. In addition, she had initial complaints of chest pain which is resolved at this time. Her troponin was WNL on admission.   Assessment and Plan:   Principal Problem:  Acute distal descending and proximal sigmoid diverticulitis  As evidenced on CT abdomen; without the perforation or an abscess  Continue cipro and flagyl but transition to oral form 8/24 Clear liquid diet  Analgesia with dilaudid 1 mg every 4 hours IV PRN severe pain and norco PO PRN moderate pain  Antiemetics as needed Active Problems:  Acute renal failure  Likely dehydration  Improved to normal with IV fluids Hypokalemia  Secondary to GI losses  Continue to supplement  Follow up BMP in am Chest pain  Likely musculoskeletal. Troponin was WNL on admission. The 12 lead EKG showed normal sinus rhythm. DVT prophylaxis  Lovenox SQ while pt is in hospital  Code Status: Full  Family Communication: Pt at bedside  Disposition Plan: Home when medically stable   IV Access:   Peripheral IV Procedures and diagnostic studies:   Ct Abdomen Pelvis W Contrast 11/06/2013 1. Acute distal descending colonic and proximal sigmoid diverticulitis without abscess, perforation, or obstruction. 2. There is no acute hepatobiliary nor acute urinary tract abnormality.  Medical Consultants:   None  Other Consultants:   Physical therapy  Anti-Infectives:   Cipro 11/06/2013 -->  Cirpofloxacin 11/06/2013 -->  Faye Ramsay, MD  [facility] Pager [phone]  If 7PM-7AM,  please contact night-coverage [email or web address] Password [email or web address] 11/08/2013, 2:12 PM   LOS: 2 days   HPI/Subjective: No events overnight.   Objective: Filed Vitals:   11/08/13 0200 11/08/13 0500 11/08/13 0948 11/08/13 1336  BP: 128/85 124/82 148/101 160/74  Pulse: 71 62 63 71  Temp: 98.1 F (36.7 C) 98.1 F (36.7 C) 98.7 F (37.1 C) 98.4 F (36.9 C)  TempSrc: Oral Oral Oral Oral  Resp: 18 18 16 18   Height:      Weight:      SpO2: 98% 98% 100% 97%    Intake/Output Summary (Last 24 hours) at 11/08/13 1412 Last data filed at 11/08/13 1209  Gross per 24 hour  Intake   1255 ml  Output   2375 ml  Net  -1120 ml    Exam:   General:  Pt is alert, follows commands appropriately, not in acute distress  Cardiovascular: Regular rate and rhythm, S1/S2, no murmurs, no rubs, no gallops  Respiratory: Clear to auscultation bilaterally, no wheezing, no crackles, no rhonchi  Abdomen: Soft, tender in lower abd quadrants, non distended, bowel sounds present, no guarding  Extremities: No edema, pulses DP and PT palpable bilaterally  Neuro: Grossly nonfocal  Data Reviewed: Basic Metabolic Panel:  Recent Labs Lab 11/06/13 1412 11/07/13 0600  NA 141 142  K 3.5* 3.6*  CL 105 107  CO2 24 24  GLUCOSE 106* 106*  BUN 9 7  CREATININE 1.12* 0.67  CALCIUM 9.1 8.8  MG  --  2.0  PHOS  --  3.8   Liver Function Tests:  Recent Labs  Lab 11/06/13 1412 11/07/13 0600  AST 12 10  ALT 13 12  ALKPHOS 72 63  BILITOT 0.3 0.3  PROT 7.0 6.3  ALBUMIN 3.3* 3.0*    Recent Labs Lab 11/06/13 1412  LIPASE 22   No results found for this basename: AMMONIA,  in the last 168 hours CBC:  Recent Labs Lab 11/06/13 1412 11/07/13 0600  WBC 7.9 8.6  NEUTROABS 4.8  --   HGB 12.1 11.4*  HCT 37.3 33.6*  MCV 90.8 89.8  PLT 419* 424*   Cardiac Enzymes:  Recent Labs Lab 11/06/13 2144  TROPONINI <0.30   Scheduled Meds: . ciprofloxacin  400 mg Intravenous BID  . docusate sodium  100 mg  Oral BID  . enoxaparin (LOVENOX) injection  40 mg Subcutaneous QHS  . hydrocortisone cream   Topical QID  . metronidazole  500 mg Intravenous 3 times per day   Continuous Infusions:

## 2013-11-09 LAB — CBC
HEMATOCRIT: 34 % — AB (ref 36.0–46.0)
HEMOGLOBIN: 11.4 g/dL — AB (ref 12.0–15.0)
MCH: 30.5 pg (ref 26.0–34.0)
MCHC: 33.5 g/dL (ref 30.0–36.0)
MCV: 90.9 fL (ref 78.0–100.0)
Platelets: 474 10*3/uL — ABNORMAL HIGH (ref 150–400)
RBC: 3.74 MIL/uL — ABNORMAL LOW (ref 3.87–5.11)
RDW: 14.7 % (ref 11.5–15.5)
WBC: 7.8 10*3/uL (ref 4.0–10.5)

## 2013-11-09 LAB — BASIC METABOLIC PANEL
Anion gap: 11 (ref 5–15)
BUN: 10 mg/dL (ref 6–23)
CO2: 25 mEq/L (ref 19–32)
CREATININE: 0.93 mg/dL (ref 0.50–1.10)
Calcium: 9.2 mg/dL (ref 8.4–10.5)
Chloride: 104 mEq/L (ref 96–112)
GFR calc Af Amer: 77 mL/min — ABNORMAL LOW (ref >90)
GFR, EST NON AFRICAN AMERICAN: 66 mL/min — AB (ref >90)
Glucose, Bld: 117 mg/dL — ABNORMAL HIGH (ref 70–99)
Potassium: 4.1 mEq/L (ref 3.7–5.3)
Sodium: 140 mEq/L (ref 137–147)

## 2013-11-09 MED ORDER — PROMETHAZINE HCL 25 MG PO TABS: 25.0000 mg | ORAL_TABLET | Freq: Four times a day (QID) | ORAL | Status: DC | PRN

## 2013-11-09 MED ORDER — METRONIDAZOLE 500 MG PO TABS
500.0000 mg | ORAL_TABLET | Freq: Three times a day (TID) | ORAL | Status: DC
Start: 2013-11-09 — End: 2015-03-30

## 2013-11-09 MED ORDER — CIPROFLOXACIN HCL 500 MG PO TABS: 500.0000 mg | ORAL_TABLET | Freq: Two times a day (BID) | ORAL | Status: DC

## 2013-11-09 MED ORDER — HYDROMORPHONE HCL 4 MG PO TABS: 4.0000 mg | ORAL_TABLET | ORAL | Status: DC | PRN

## 2013-11-09 NOTE — Progress Notes (Signed)
D/C put on hold as pt with nausea and one episode of vomiting. Possible d/c in AM.  Faye Ramsay, MD  [facility] Pager [phone]  If 7PM-7AM, please contact night-coverage [email or web address] Password [email or web address]

## 2013-11-09 NOTE — Discharge Summary (Signed)
Physician Discharge Summary  Charlotte Garcia WLN:[id number] DOB: [birth year] DOA: 11/06/2013  PCP: No primary provider on file.  Admit date: 11/06/2013 Discharge date: 11/09/2013  Recommendations for Outpatient Follow-up:  1. Pt will need to follow up with PCP in 2-3 weeks post discharge 2. Please obtain BMP to evaluate electrolytes and kidney function 3. Please also check CBC to evaluate Hg and Hct levels 4. Continue Cipro and Flagyl for 12 more days post discharge   Discharge Diagnoses: Acute diverticulitis  Active Problems:   Hypertension   Tobacco abuse   Diverticulitis   Atypical chest pain   Hypokalemia    Discharge Condition: Stable  Diet recommendation: Heart healthy diet discussed in details   Brief narrative:  58 y.o. female with past medical history of hypertension, smoking who presented to [facility] ED 11/06/2013 with reports of abdominal pain and low grade fevers at home. In ED, she was found to have acute diverticulitis and was started on cipro and flagyl. In addition, she had initial complaints of chest pain which is resolved at this time. Her troponin was WNL on admission.   Assessment and Plan:   Principal Problem:  Acute distal descending and proximal sigmoid diverticulitis  As evidenced on CT abdomen; without the perforation or an abscess  Continue cipro and flagyl Advance diet to regular and d/c if pt tolerating well  Antiemetics as needed Active Problems:  Acute renal failure  Likely dehydration  Improved to normal with IV fluids Hypokalemia  Secondary to GI losses  Continue to supplement  Follow up BMP in am Chest pain  Likely musculoskeletal. Troponin was WNL on admission. The 12 lead EKG showed normal sinus rhythm. Now resolved  DVT prophylaxis  Lovenox SQ while pt is in hospital  Code Status: Full  Family Communication: Pt at bedside  Disposition Plan: Home today or AM  IV Access:   Peripheral IV Procedures and diagnostic studies:   Ct Abdomen  Pelvis W Contrast 11/06/2013 1. Acute distal descending colonic and proximal sigmoid diverticulitis without abscess, perforation, or obstruction. 2. There is no acute hepatobiliary nor acute urinary tract abnormality.  Medical Consultants:   None  Other Consultants:   Physical therapy  Anti-Infectives:   Cipro 11/06/2013 -->  Cirpofloxacin 11/06/2013 -->  Discharge Exam: Filed Vitals:   11/09/13 1030  BP: 115/73  Pulse: 73  Temp: 98.1 F (36.7 C)  Resp: 20   Filed Vitals:   11/08/13 2133 11/09/13 0200 11/09/13 0539 11/09/13 1030  BP: 147/95 133/107 177/90 115/73  Pulse: 70 65 76 73  Temp: 98.5 F (36.9 C) 98.6 F (37 C) 98.7 F (37.1 C) 98.1 F (36.7 C)  TempSrc: Oral Oral Oral Oral  Resp: 16 16 16 20   Height:      Weight:      SpO2: 99% 98% 97% 98%    General: Pt is alert, follows commands appropriately, not in acute distress Cardiovascular: Regular rate and rhythm, S1/S2 +, no murmurs, no rubs, no gallops Respiratory: Clear to auscultation bilaterally, no wheezing, no crackles, no rhonchi Abdominal: Soft, non tender, non distended, bowel sounds +, no guarding Extremities: no edema, no cyanosis, pulses palpable bilaterally DP and PT Neuro: Grossly nonfocal  Discharge Instructions     Medication List         acetaminophen 500 MG tablet  Commonly known as:  TYLENOL  Take 1,000 mg by mouth every 8 (eight) hours as needed for mild pain or headache.     ciprofloxacin 500 MG tablet  Commonly known as:  CIPRO  Take 1 tablet (500 mg total) by mouth 2 (two) times daily.     HYDROmorphone 4 MG tablet  Commonly known as:  DILAUDID  Take 1 tablet (4 mg total) by mouth every 4 (four) hours as needed for severe pain.     ibuprofen 200 MG tablet  Commonly known as:  ADVIL,MOTRIN  Take 200 mg by mouth every 6 (six) hours as needed for moderate pain.     metroNIDAZOLE 500 MG tablet  Commonly known as:  FLAGYL  Take 1 tablet (500 mg total) by mouth every 8 (eight) hours.      promethazine 25 MG tablet  Commonly known as:  PHENERGAN  Take 1 tablet (25 mg total) by mouth every 6 (six) hours as needed for nausea or vomiting.           Follow-up Information   Follow up with Faye Ramsay, MD. (As needed, call my cell phone [phone])    Specialty:  Internal Medicine   Contact information:   [address] [place] [address] [phone]        The results of significant diagnostics from this hospitalization (including imaging, microbiology, ancillary and laboratory) are listed below for reference.     Microbiology: No results found for this or any previous visit (from the past 240 hour(s)).   Labs: Basic Metabolic Panel:  Recent Labs Lab 11/06/13 1412 11/07/13 0600 11/09/13 0002  NA 141 142 140  K 3.5* 3.6* 4.1  CL 105 107 104  CO2 24 24 25   GLUCOSE 106* 106* 117*  BUN 9 7 10   CREATININE 1.12* 0.67 0.93  CALCIUM 9.1 8.8 9.2  MG  --  2.0  --   PHOS  --  3.8  --    Liver Function Tests:  Recent Labs Lab 11/06/13 1412 11/07/13 0600  AST 12 10  ALT 13 12  ALKPHOS 72 63  BILITOT 0.3 0.3  PROT 7.0 6.3  ALBUMIN 3.3* 3.0*    Recent Labs Lab 11/06/13 1412  LIPASE 22   No results found for this basename: AMMONIA,  in the last 168 hours CBC:  Recent Labs Lab 11/06/13 1412 11/07/13 0600 11/09/13 0002  WBC 7.9 8.6 7.8  NEUTROABS 4.8  --   --   HGB 12.1 11.4* 11.4*  HCT 37.3 33.6* 34.0*  MCV 90.8 89.8 90.9  PLT 419* 424* 474*   Cardiac Enzymes:  Recent Labs Lab 11/06/13 2144  TROPONINI <0.30   BNP: BNP (last 3 results) No results found for this basename: PROBNP,  in the last 8760 hours CBG: No results found for this basename: GLUCAP,  in the last 168 hours   SIGNED: Time coordinating discharge: Over 30 minutes  Faye Ramsay, MD  [facility] 11/09/2013, 11:22 AM Pager [phone]  If 7PM-7AM, please contact night-coverage [email or web address] Password [email or web address]

## 2013-11-09 NOTE — Discharge Instructions (Signed)

## 2013-11-10 NOTE — Progress Notes (Signed)
Patient given discharge instructions and prescriptions.  Left via wheelchair to car.

## 2013-11-10 NOTE — Progress Notes (Signed)
Pt stable for d/c, concerned about lost glasses. Offered emotional support.  Faye Ramsay, MD  [facility] Pager [phone]  If 7PM-7AM, please contact night-coverage [email or web address] Password [email or web address]

## 2014-01-17 ENCOUNTER — Encounter (HOSPITAL_COMMUNITY): Payer: Self-pay | Admitting: Emergency Medicine

## 2014-08-24 ENCOUNTER — Encounter (HOSPITAL_COMMUNITY): Payer: Self-pay | Admitting: Emergency Medicine

## 2014-08-24 ENCOUNTER — Emergency Department (HOSPITAL_COMMUNITY)
Admission: EM | Admit: 2014-08-24 | Discharge: 2014-08-24 | Disposition: A | Discharge status: Home / Self Care | Payer: Self-pay | Attending: Emergency Medicine | Admitting: Emergency Medicine

## 2014-08-24 DIAGNOSIS — M25562 Pain in left knee: Secondary | ICD-10-CM | POA: Insufficient documentation

## 2014-08-24 DIAGNOSIS — Z7982 Long term (current) use of aspirin: Secondary | ICD-10-CM | POA: Insufficient documentation

## 2014-08-24 DIAGNOSIS — Z720 Tobacco use: Secondary | ICD-10-CM | POA: Insufficient documentation

## 2014-08-24 DIAGNOSIS — I10 Essential (primary) hypertension: Secondary | ICD-10-CM | POA: Insufficient documentation

## 2014-08-24 DIAGNOSIS — Z8709 Personal history of other diseases of the respiratory system: Secondary | ICD-10-CM | POA: Insufficient documentation

## 2014-08-24 DIAGNOSIS — M25561 Pain in right knee: Secondary | ICD-10-CM | POA: Insufficient documentation

## 2014-08-24 DIAGNOSIS — R011 Cardiac murmur, unspecified: Secondary | ICD-10-CM | POA: Insufficient documentation

## 2014-08-24 DIAGNOSIS — G8929 Other chronic pain: Secondary | ICD-10-CM | POA: Insufficient documentation

## 2014-08-24 DIAGNOSIS — Z8639 Personal history of other endocrine, nutritional and metabolic disease: Secondary | ICD-10-CM | POA: Insufficient documentation

## 2014-08-24 LAB — CBC WITH DIFFERENTIAL/PLATELET
BASOS PCT: 0 % (ref 0–1)
Basophils Absolute: 0 10*3/uL (ref 0.0–0.1)
EOS ABS: 0.1 10*3/uL (ref 0.0–0.7)
Eosinophils Relative: 1 % (ref 0–5)
HCT: 37.8 % (ref 36.0–46.0)
Hemoglobin: 12.3 g/dL (ref 12.0–15.0)
Lymphocytes Relative: 34 % (ref 12–46)
Lymphs Abs: 2.4 10*3/uL (ref 0.7–4.0)
MCH: 30.6 pg (ref 26.0–34.0)
MCHC: 32.5 g/dL (ref 30.0–36.0)
MCV: 94 fL (ref 78.0–100.0)
Monocytes Absolute: 0.4 10*3/uL (ref 0.1–1.0)
Monocytes Relative: 5 % (ref 3–12)
NEUTROS ABS: 4.2 10*3/uL (ref 1.7–7.7)
Neutrophils Relative %: 60 % (ref 43–77)
PLATELETS: 397 10*3/uL (ref 150–400)
RBC: 4.02 MIL/uL (ref 3.87–5.11)
RDW: 15.2 % (ref 11.5–15.5)
WBC: 7 10*3/uL (ref 4.0–10.5)

## 2014-08-24 LAB — COMPREHENSIVE METABOLIC PANEL
ALBUMIN: 4.1 g/dL (ref 3.5–5.0)
ALT: 16 U/L (ref 14–54)
ANION GAP: 8 (ref 5–15)
AST: 14 U/L — ABNORMAL LOW (ref 15–41)
Alkaline Phosphatase: 67 U/L (ref 38–126)
BUN: 19 mg/dL (ref 6–20)
CO2: 26 mmol/L (ref 22–32)
Calcium: 9 mg/dL (ref 8.9–10.3)
Chloride: 109 mmol/L (ref 101–111)
Creatinine, Ser: 0.83 mg/dL (ref 0.44–1.00)
GFR calc Af Amer: >60 mL/min (ref >60)
GFR calc non Af Amer: >60 mL/min (ref >60)
Glucose, Bld: 128 mg/dL — ABNORMAL HIGH (ref 65–99)
POTASSIUM: 3.5 mmol/L (ref 3.5–5.1)
Sodium: 143 mmol/L (ref 135–145)
TOTAL PROTEIN: 7.2 g/dL (ref 6.5–8.1)
Total Bilirubin: 0.5 mg/dL (ref 0.3–1.2)

## 2014-08-24 LAB — LIPASE, BLOOD: Lipase: 26 U/L (ref 22–51)

## 2014-08-24 MED ORDER — OXYCODONE-ACETAMINOPHEN 5-325 MG PO TABS
2.0000 | ORAL_TABLET | Freq: Once | ORAL | Status: AC
  Administered 2014-08-24: 2 via ORAL
  Filled 2014-08-24: qty 2

## 2014-08-24 NOTE — ED Provider Notes (Signed)
CSN: [id number]     Arrival date & time 08/24/14  1414 History   First MD Initiated Contact with Patient 08/24/14 1556     Chief Complaint  Patient presents with  . Knee Pain  . Abdominal Pain      HPI Patient reports a long-standing history of bilateral knee pain.  Today she reports her knee pain became more severe and it caused her knees to buckle on 3 different occasions.  She is ambulatory in the emergency department.  She reports ongoing discomfort and pain in her bilateral knees.  She states that her orthopedist wants to provide bilateral knee replacements but the patient is currently without insurance.  She is requesting something for pain at this time.  She denies weakness of her lower extremities.  She is a states the pain became too severe.  No fevers or chills.  Denies abdominal pain at this time.  She did have some generalized abdominal cramping last night.  Diarrhea this morning without nausea or vomiting.  No fevers or chills.   Past Medical History  Diagnosis Date  . Hypertension   . Hypokalemia   . Murmur, heart   . Bronchitis   . Tobacco abuse    Past Surgical History  Procedure Laterality Date  . Wrist surgery      gangular cyst  . Cystectomy      forehead; back of head  . Dilation and curettage of uterus     Family History  Problem Relation Age of Onset  . Hypertension Father   . Heart disease Father   . Hypertension Mother   . Heart disease Mother   . Renal Disease Brother    History  Substance Use Topics  . Smoking status: Current Some Day Smoker -- 0.50 packs/day for 20 years    Types: Cigarettes  . Smokeless tobacco: Never Used  . Alcohol Use: Yes     Comment: 1-2 glasses of wine 3 times a week   OB History    Gravida Para Term Preterm AB TAB SAB Ectopic Multiple Living   3 1 1  2  2   1      Review of Systems  All other systems reviewed and are negative.     Allergies  Sulfa antibiotics and Codeine  Home Medications   Prior to  Admission medications   Medication Sig Start Date End Date Taking? Authorizing Provider  aspirin EC 81 MG tablet Take 81 mg by mouth daily.   Yes Historical Provider, MD  cetirizine (ZYRTEC) 10 MG tablet Take 10 mg by mouth daily as needed for allergies.   Yes Historical Provider, MD  naproxen sodium (ANAPROX) 220 MG tablet Take 440 mg by mouth 2 (two) times daily as needed (pain).   Yes Historical Provider, MD  ciprofloxacin (CIPRO) 500 MG tablet Take 1 tablet (500 mg total) by mouth 2 (two) times daily. Patient not taking: Reported on 08/24/2014 11/09/13   Theodis Blaze, MD  HYDROmorphone (DILAUDID) 4 MG tablet Take 1 tablet (4 mg total) by mouth every 4 (four) hours as needed for severe pain. Patient not taking: Reported on 08/24/2014 11/09/13   Theodis Blaze, MD  metroNIDAZOLE (FLAGYL) 500 MG tablet Take 1 tablet (500 mg total) by mouth every 8 (eight) hours. Patient not taking: Reported on 08/24/2014 11/09/13   Theodis Blaze, MD  promethazine (PHENERGAN) 25 MG tablet Take 1 tablet (25 mg total) by mouth every 6 (six) hours as needed for nausea or vomiting.  Patient not taking: Reported on 08/24/2014 11/09/13   Theodis Blaze, MD   BP 111/84 mmHg  Pulse 73  Temp(Src) 98.4 F (36.9 C) (Oral)  Resp 16  SpO2 95% Physical Exam  Constitutional: She is oriented to person, place, and time. She appears well-developed and well-nourished. No distress.  HENT:  Head: Normocephalic and atraumatic.  Eyes: EOM are normal.  Neck: Normal range of motion.  Cardiovascular: Normal rate, regular rhythm and normal heart sounds.   Pulmonary/Chest: Effort normal and breath sounds normal.  Abdominal: Soft. She exhibits no distension. There is no tenderness.  Musculoskeletal: Normal range of motion.  Full range of motion bilateral hips, knees, ankles.  Normal pulses in lower extremities.  Neurological: She is alert and oriented to person, place, and time.  Skin: Skin is warm and dry.  Psychiatric: She has a normal mood  and affect. Judgment normal.  Nursing note and vitals reviewed.   ED Course  Procedures (including critical care time) Labs Review Labs Reviewed  COMPREHENSIVE METABOLIC PANEL - Abnormal; Notable for the following:    Glucose, Bld 128 (*)    AST 14 (*)    All other components within normal limits  CBC WITH DIFFERENTIAL/PLATELET  LIPASE, BLOOD    Imaging Review No results found.   EKG Interpretation None      MDM   Final diagnoses:  Bilateral knee pain    Normal pulses in lower extremities.  This appears to be an exacerbation of chronic pain.  Pain treated in the emergency department.  Home with anti-inflammatories and instructions to follow-up with her primary care physician.    Jola Schmidt, MD 08/24/14 1840

## 2014-08-24 NOTE — ED Notes (Signed)
Pt originally checked in under the complaint, "can't walk".  However, pt was ambulatory to triage rm w/o assistance.  Pt is c/o bilateral knee pain x 5 years.  Pt also states she has diverticulitis and states that she has had gen abd cramping since last night.  States she had diarrhea this morning.

## 2014-10-25 ENCOUNTER — Ambulatory Visit: Payer: Self-pay

## 2015-03-30 ENCOUNTER — Inpatient Hospital Stay (HOSPITAL_COMMUNITY)
Admission: EM | Admit: 2015-03-30 | Discharge: 2015-04-04 | DRG: 392 | Disposition: A | Discharge status: Home / Self Care | Payer: Self-pay | Attending: Internal Medicine | Admitting: Internal Medicine

## 2015-03-30 ENCOUNTER — Emergency Department (HOSPITAL_COMMUNITY): Payer: Self-pay

## 2015-03-30 ENCOUNTER — Encounter (HOSPITAL_COMMUNITY): Payer: Self-pay

## 2015-03-30 DIAGNOSIS — K5792 Diverticulitis of intestine, part unspecified, without perforation or abscess without bleeding: Secondary | ICD-10-CM

## 2015-03-30 DIAGNOSIS — R1032 Left lower quadrant pain: Secondary | ICD-10-CM

## 2015-03-30 DIAGNOSIS — Z8249 Family history of ischemic heart disease and other diseases of the circulatory system: Secondary | ICD-10-CM

## 2015-03-30 DIAGNOSIS — K5732 Diverticulitis of large intestine without perforation or abscess without bleeding: Principal | ICD-10-CM | POA: Diagnosis present

## 2015-03-30 DIAGNOSIS — Z79899 Other long term (current) drug therapy: Secondary | ICD-10-CM

## 2015-03-30 DIAGNOSIS — Z720 Tobacco use: Secondary | ICD-10-CM

## 2015-03-30 DIAGNOSIS — I10 Essential (primary) hypertension: Secondary | ICD-10-CM

## 2015-03-30 DIAGNOSIS — F1010 Alcohol abuse, uncomplicated: Secondary | ICD-10-CM | POA: Diagnosis present

## 2015-03-30 DIAGNOSIS — Z7982 Long term (current) use of aspirin: Secondary | ICD-10-CM

## 2015-03-30 DIAGNOSIS — Z888 Allergy status to other drugs, medicaments and biological substances status: Secondary | ICD-10-CM

## 2015-03-30 DIAGNOSIS — F17200 Nicotine dependence, unspecified, uncomplicated: Secondary | ICD-10-CM | POA: Diagnosis present

## 2015-03-30 DIAGNOSIS — Z841 Family history of disorders of kidney and ureter: Secondary | ICD-10-CM

## 2015-03-30 DIAGNOSIS — Z882 Allergy status to sulfonamides status: Secondary | ICD-10-CM

## 2015-03-30 LAB — URINE MICROSCOPIC-ADD ON

## 2015-03-30 LAB — CBC
HCT: 37.1 % (ref 36.0–46.0)
Hemoglobin: 12.1 g/dL (ref 12.0–15.0)
MCH: 29.9 pg (ref 26.0–34.0)
MCHC: 32.6 g/dL (ref 30.0–36.0)
MCV: 91.6 fL (ref 78.0–100.0)
PLATELETS: 328 10*3/uL (ref 150–400)
RBC: 4.05 MIL/uL (ref 3.87–5.11)
RDW: 14.2 % (ref 11.5–15.5)
WBC: 15.6 10*3/uL — AB (ref 4.0–10.5)

## 2015-03-30 LAB — COMPREHENSIVE METABOLIC PANEL
ALT: 19 U/L (ref 14–54)
AST: 13 U/L — AB (ref 15–41)
Albumin: 4.2 g/dL (ref 3.5–5.0)
Alkaline Phosphatase: 67 U/L (ref 38–126)
Anion gap: 11 (ref 5–15)
BUN: 9 mg/dL (ref 6–20)
CO2: 25 mmol/L (ref 22–32)
CREATININE: 0.53 mg/dL (ref 0.44–1.00)
Calcium: 9.2 mg/dL (ref 8.9–10.3)
Chloride: 103 mmol/L (ref 101–111)
GFR calc Af Amer: >60 mL/min (ref >60)
Glucose, Bld: 112 mg/dL — ABNORMAL HIGH (ref 65–99)
Potassium: 3.5 mmol/L (ref 3.5–5.1)
SODIUM: 139 mmol/L (ref 135–145)
TOTAL PROTEIN: 7.2 g/dL (ref 6.5–8.1)
Total Bilirubin: 1.4 mg/dL — ABNORMAL HIGH (ref 0.3–1.2)

## 2015-03-30 LAB — URINALYSIS, ROUTINE W REFLEX MICROSCOPIC
Bilirubin Urine: NEGATIVE
Glucose, UA: NEGATIVE mg/dL
KETONES UR: NEGATIVE mg/dL
LEUKOCYTES UA: NEGATIVE
NITRITE: NEGATIVE
PROTEIN: NEGATIVE mg/dL
Specific Gravity, Urine: 1.022 (ref 1.005–1.030)
pH: 6.5 (ref 5.0–8.0)

## 2015-03-30 LAB — LIPASE, BLOOD: LIPASE: 17 U/L (ref 11–51)

## 2015-03-30 MED ORDER — METRONIDAZOLE IN NACL 5-0.79 MG/ML-% IV SOLN
500.0000 mg | Freq: Once | INTRAVENOUS | Status: AC
  Administered 2015-03-30: 500 mg via INTRAVENOUS
  Filled 2015-03-30: qty 100

## 2015-03-30 MED ORDER — FENTANYL CITRATE (PF) 100 MCG/2ML IJ SOLN
50.0000 ug | Freq: Once | INTRAMUSCULAR | Status: AC
  Administered 2015-03-30: 50 ug via INTRAVENOUS
  Filled 2015-03-30: qty 2

## 2015-03-30 MED ORDER — IOHEXOL 300 MG/ML  SOLN
100.0000 mL | Freq: Once | INTRAMUSCULAR | Status: AC | PRN
  Administered 2015-03-30: 100 mL via INTRAVENOUS

## 2015-03-30 MED ORDER — ONDANSETRON HCL 4 MG/2ML IJ SOLN
4.0000 mg | Freq: Once | INTRAMUSCULAR | Status: AC
  Administered 2015-03-30: 4 mg via INTRAVENOUS
  Filled 2015-03-30: qty 2

## 2015-03-30 MED ORDER — SODIUM CHLORIDE 0.9 % IV BOLUS (SEPSIS)
500.0000 mL | Freq: Once | INTRAVENOUS | Status: AC
  Administered 2015-03-30: 500 mL via INTRAVENOUS

## 2015-03-30 MED ORDER — HYDROMORPHONE HCL 1 MG/ML IJ SOLN
0.5000 mg | INTRAMUSCULAR | Status: DC | PRN
  Administered 2015-03-31 – 2015-04-04 (×22): 0.5 mg via INTRAVENOUS
  Filled 2015-03-30 (×22): qty 1

## 2015-03-30 MED ORDER — CIPROFLOXACIN IN D5W 400 MG/200ML IV SOLN
400.0000 mg | Freq: Once | INTRAVENOUS | Status: AC
  Administered 2015-03-30: 400 mg via INTRAVENOUS
  Filled 2015-03-30: qty 200

## 2015-03-30 NOTE — H&P (Signed)
[facility] History and Physical  Charlotte Garcia [id number] DOB: [birth year] DOA: 03/30/2015  PCP: No PCP Per Patient   Chief Complaint: LLQ abdominal pain, fever, nausea  HPI: Charlotte Garcia is a 60 y.o. woman with a history of high blood pressure (currently not on any prescription medications for this; says blood pressure is typically exacerbated by anxiety or high-stress situations), active tobacco and EtOH use, and two prior episodes of diverticulitis who feels that she was in her baseline state of health until two days ago.  She had had fever (reportedly up to 103 at home), chills, sweats, and left lower quadrant abdominal pain that radiates into her back.  She has had decreased appetite, decreased PO intake, nausea but no significant vomiting, and decreased urine output.  She has had light-headedness but no syncope/LOC.  No chest pain or shortness of breath.  She lives at home alone, and she called EMS for transport to the ED today.  Evaluation concerning for leukocytosis and CT changes consistent with another episode of diverticulitis.  Hospitalist asked to admit.  Of note, she says this is her third episode of diverticulitis.  She has never had a colonoscopy.  She does not have a GI doctor.  Review of Systems: 12 systems reviewed and negative except as stated in HPI.  Past Medical History  Diagnosis Date  . Hypertension   . Hypokalemia   . Murmur, heart   . Bronchitis   . Tobacco abuse   . Diverticulitis    Past Surgical History  Procedure Laterality Date  . Wrist surgery      gangular cyst  . Cystectomy      forehead; back of head  . Dilation and curettage of uterus     Social History:  Social History   Social History Narrative  Active tobacco use, most of her adult life, currently smokes 2-3 cigarettes daily after work She drinks wine 2-3 nights per week No illicit drug use She is not married.  She has an adult daughter who is pregnant with  twins.  Allergies  Allergen Reactions  . Sulfa Antibiotics Hives  . Codeine Nausea And Vomiting  . Morphine And Related Nausea Only    Family History  Problem Relation Age of Onset  . Hypertension Father   . Heart disease Father   . Hypertension Mother   . Heart disease Mother   . Renal Disease Brother    Prior to Admission medications   Medication Sig Start Date End Date Taking? Authorizing Provider  aspirin EC 81 MG tablet Take 81 mg by mouth daily.   Yes Historical Provider, MD  cetirizine (ZYRTEC) 10 MG tablet Take 10 mg by mouth daily as needed for allergies.   Yes Historical Provider, MD  naproxen sodium (ANAPROX) 220 MG tablet Take 440 mg by mouth 2 (two) times daily as needed (pain).   Yes Historical Provider, MD   Physical Exam: Filed Vitals:   03/30/15 1216 03/30/15 1808 03/30/15 2032 03/30/15 2323  BP: 138/78 147/84 154/99 147/91  Pulse: 93 95 88 88  Temp: 98.7 F (37.1 C)     TempSrc: Oral Oral    Resp: 17  16 16   SpO2: 98% 98% 100% 99%   General:  Awake and alert.  Oriented to person, place, time and situation.  NAD.  Rather nontoxic appearing. Eyes: PERRL bilaterally, conjunctiva are pink.  EOMI. ENT: Moist mucous membranes.  No nasal drainage. Neck: Supple. Cardiovascular: NR/RR.  No LE edema. Respiratory: CTA  bilaterally. Abdomen: Soft and compressible but she has LLQ tenderness.  No significant guarding, mild rebound tenderness.  Bowel sounds are present. Skin: Warm and dry. Musculoskeletal: Moves all four extremities spontaneously. Psychiatric: Normal affect. Neurologic: No focal deficits.  Labs on Admission:  Basic Metabolic Panel:  Recent Labs Lab 03/30/15 0920  NA 139  K 3.5  CL 103  CO2 25  GLUCOSE 112*  BUN 9  CREATININE 0.53  CALCIUM 9.2   Liver Function Tests:  Recent Labs Lab 03/30/15 0920  AST 13*  ALT 19  ALKPHOS 67  BILITOT 1.4*  PROT 7.2  ALBUMIN 4.2    Recent Labs Lab 03/30/15 0920  LIPASE 17    CBC:  Recent Labs Lab 03/30/15 0920  WBC 15.6*  HGB 12.1  HCT 37.1  MCV 91.6  PLT 328   Radiological Exams on Admission: Ct Abdomen Pelvis W Contrast  03/30/2015  CLINICAL DATA:  10 female with left lower quadrant abdominal pain EXAM: CT ABDOMEN AND PELVIS WITH CONTRAST TECHNIQUE: Multidetector CT imaging of the abdomen and pelvis was performed using the standard protocol following bolus administration of intravenous contrast. CONTRAST:  167mL OMNIPAQUE IOHEXOL 300 MG/ML  SOLN COMPARISON:  CT dated 11/06/2013 FINDINGS: The visualized lung bases are clear. No intra-abdominal free air or free fluid. Subcentimeter inferior right hepatic hypodense lesion is too small to characterize. The liver, gallbladder, pancreas, spleen, adrenal glands, kidneys, visualized ureters, and urinary bladder appear unremarkable. The uterus is grossly unremarkable. There is sigmoid diverticulosis with muscular hypertrophy. There is inflammatory changes of the distal descending/sigmoid junction compatible with diverticulitis. No discrete drainable fluid collection/ abscess identified at this time. No extraluminal gas noted. There is no evidence of bowel obstruction. Normal appendix. The abdominal aorta and IVC appear patent. No portal venous gas identified. There is no adenopathy. Small fat containing umbilical hernia. There is degenerative changes of the spine. No acute fracture. IMPRESSION: Diverticulitis of the distal descending/ sigmoid colon.  No abscess. Electronically Signed   By: Anner Crete M.D.   On: 03/30/2015 22:31   Assessment/Plan Principal Problem:   Acute diverticulitis Active Problems:   Hypertension   Tobacco abuse   1.  Admit to med surg  2.  Acute diverticulitis with intractable abdominal pain and decreased PO intake --Admit for IV cipro and flagyl, IV fluid resuscitation with NS, and anti-emetics and analgesics as needed --With her history of recurrent diverticulitis (and the  fact that she is overdue for screening colonoscopy), she will need a GI and/or general surgery referrals at discharge. --Clear liquid diet for now, advance as tolerated  3.  Accelerated HTN, untreated --IV labetalol prn for systolic blood pressure greater than [id number] or diastolic blood pressure greater than 100 --Monitor trend  4.  Active tobacco use --Smoking cessation counseling  Code Status: FULL Disposition Plan: Expect a two midnight stay  Time spent: 60 minutes  [employer] [facility]  03/30/2015, 11:46 PM

## 2015-03-30 NOTE — ED Provider Notes (Signed)
CSN: [id number]     Arrival date & time 03/30/15  [id number] History   First MD Initiated Contact with Patient 03/30/15 2029     Chief Complaint  Patient presents with  . Abdominal Pain  . Flank Pain      Patient is a 60 y.o. female presenting with abdominal pain and flank pain. The history is provided by the patient.  Abdominal Pain Pain location:  LLQ Associated symptoms: chills, constipation, fatigue, fever and nausea   Associated symptoms: no chest pain, no diarrhea and no shortness of breath   Flank Pain Associated symptoms include abdominal pain. Pertinent negatives include no chest pain, no headaches and no shortness of breath.   patient presents with left lower quadrant abdominal pain for last 2-3 days. No vomiting present nausea. She has had decreased appetite. History of diverticulitis and states it feels like it. Pain is in her left lower abdomen does go to left flank. Pain with walking. Pain with bumps in the road. States she cannot drive because the pain is so severe. He gets   Past Medical History  Diagnosis Date  . Hypertension   . Hypokalemia   . Murmur, heart   . Bronchitis   . Tobacco abuse   . Diverticulitis    Past Surgical History  Procedure Laterality Date  . Wrist surgery      gangular cyst  . Cystectomy      forehead; back of head  . Dilation and curettage of uterus     Family History  Problem Relation Age of Onset  . Hypertension Father   . Heart disease Father   . Hypertension Mother   . Heart disease Mother   . Renal Disease Brother    Social History  Substance Use Topics  . Smoking status: Current Some Day Smoker -- 0.50 packs/day for 20 years    Types: Cigarettes  . Smokeless tobacco: Never Used  . Alcohol Use: Yes     Comment: 1-2 glasses of wine 3 times a week   OB History    Gravida Para Term Preterm AB TAB SAB Ectopic Multiple Living   3 1 1  2  2   1      Review of Systems  Constitutional: Positive for fever, chills, appetite change  and fatigue. Negative for activity change.  Eyes: Negative for pain.  Respiratory: Negative for chest tightness and shortness of breath.   Cardiovascular: Negative for chest pain and leg swelling.  Gastrointestinal: Positive for nausea, abdominal pain and constipation. Negative for diarrhea.  Genitourinary: Positive for flank pain.  Musculoskeletal: Negative for back pain and neck stiffness.  Skin: Negative for rash.  Neurological: Negative for weakness, numbness and headaches.  Psychiatric/Behavioral: Negative for behavioral problems.      Allergies  Sulfa antibiotics; Codeine; and Morphine and related  Home Medications   Prior to Admission medications   Medication Sig Start Date End Date Taking? Authorizing Provider  aspirin EC 81 MG tablet Take 81 mg by mouth daily.   Yes Historical Provider, MD  cetirizine (ZYRTEC) 10 MG tablet Take 10 mg by mouth daily as needed for allergies.   Yes Historical Provider, MD  naproxen sodium (ANAPROX) 220 MG tablet Take 440 mg by mouth 2 (two) times daily as needed (pain).   Yes Historical Provider, MD   BP 121/87 mmHg  Pulse 71  Temp(Src) 98.7 F (37.1 C) (Oral)  Resp 16  Ht 5\' 2"  (1.575 m)  Wt 285 lb 4.4 oz (129.4 kg)  BMI 52.16 kg/m2  SpO2 97% Physical Exam  Constitutional: She appears well-developed.  Cardiovascular: Normal rate.   Pulmonary/Chest: Effort normal.  Abdominal: There is tenderness.  Moderate right lower quadrant tenderness without mass.  Musculoskeletal: Normal range of motion.  Neurological: She is alert.  Skin: Skin is warm.    ED Course  Procedures (including critical care time) Labs Review Labs Reviewed  COMPREHENSIVE METABOLIC PANEL - Abnormal; Notable for the following:    Glucose, Bld 112 (*)    AST 13 (*)    Total Bilirubin 1.4 (*)    All other components within normal limits  CBC - Abnormal; Notable for the following:    WBC 15.6 (*)    All other components within normal limits  URINALYSIS,  ROUTINE W REFLEX MICROSCOPIC (NOT AT [facility]) - Abnormal; Notable for the following:    Hgb urine dipstick TRACE (*)    All other components within normal limits  URINE MICROSCOPIC-ADD ON - Abnormal; Notable for the following:    Squamous Epithelial / LPF 0-5 (*)    Bacteria, UA FEW (*)    All other components within normal limits  CBC - Abnormal; Notable for the following:    RBC 3.50 (*)    Hemoglobin 10.6 (*)    HCT 33.0 (*)    All other components within normal limits  BASIC METABOLIC PANEL - Abnormal; Notable for the following:    Potassium 3.4 (*)    Glucose, Bld 115 (*)    Calcium 8.6 (*)    All other components within normal limits  LIPASE, BLOOD  CBC  BASIC METABOLIC PANEL    Imaging Review Ct Abdomen Pelvis W Contrast  03/30/2015  CLINICAL DATA:  Year-old female with left lower quadrant abdominal pain EXAM: CT ABDOMEN AND PELVIS WITH CONTRAST TECHNIQUE: Multidetector CT imaging of the abdomen and pelvis was performed using the standard protocol following bolus administration of intravenous contrast. CONTRAST:  167mL OMNIPAQUE IOHEXOL 300 MG/ML  SOLN COMPARISON:  CT dated 11/06/2013 FINDINGS: The visualized lung bases are clear. No intra-abdominal free air or free fluid. Subcentimeter inferior right hepatic hypodense lesion is too small to characterize. The liver, gallbladder, pancreas, spleen, adrenal glands, kidneys, visualized ureters, and urinary bladder appear unremarkable. The uterus is grossly unremarkable. There is sigmoid diverticulosis with muscular hypertrophy. There is inflammatory changes of the distal descending/sigmoid junction compatible with diverticulitis. No discrete drainable fluid collection/ abscess identified at this time. No extraluminal gas noted. There is no evidence of bowel obstruction. Normal appendix. The abdominal aorta and IVC appear patent. No portal venous gas identified. There is no adenopathy. Small fat containing umbilical hernia. There is  degenerative changes of the spine. No acute fracture. IMPRESSION: Diverticulitis of the distal descending/ sigmoid colon.  No abscess. Electronically Signed   By: Anner Crete M.D.   On: 03/30/2015 22:31   I have personally reviewed and evaluated these images and lab results as part of my medical decision-making.   EKG Interpretation None      MDM   Final diagnoses:  Acute diverticulitis    Patient with abdominal pain flank pain. Has diverticulitis. Pain poorly controlled. Will admit to internal medicine.    Davonna Belling, MD 04/01/15 [phone]

## 2015-03-30 NOTE — ED Notes (Signed)
Bed: [facility]1 Expected date:  Expected time:  Means of arrival:  Comments: EMS 

## 2015-03-30 NOTE — ED Notes (Signed)
Per EMS, Pt, from home, c/o LLQ abdominal pain radiating into L flank and nausea x 1 day.  Pain score 10/10.  Denies v/d.  Denies urinary complaints.  Hx of diverticulitis.

## 2015-03-31 LAB — CBC
HCT: 33 % — ABNORMAL LOW (ref 36.0–46.0)
Hemoglobin: 10.6 g/dL — ABNORMAL LOW (ref 12.0–15.0)
MCH: 30.3 pg (ref 26.0–34.0)
MCHC: 32.1 g/dL (ref 30.0–36.0)
MCV: 94.3 fL (ref 78.0–100.0)
PLATELETS: 335 10*3/uL (ref 150–400)
RBC: 3.5 MIL/uL — ABNORMAL LOW (ref 3.87–5.11)
RDW: 14.6 % (ref 11.5–15.5)
WBC: 8.8 10*3/uL (ref 4.0–10.5)

## 2015-03-31 LAB — BASIC METABOLIC PANEL
ANION GAP: 8 (ref 5–15)
BUN: 9 mg/dL (ref 6–20)
CALCIUM: 8.6 mg/dL — AB (ref 8.9–10.3)
CO2: 23 mmol/L (ref 22–32)
Chloride: 109 mmol/L (ref 101–111)
Creatinine, Ser: 0.59 mg/dL (ref 0.44–1.00)
GFR calc Af Amer: >60 mL/min (ref >60)
GLUCOSE: 115 mg/dL — AB (ref 65–99)
Potassium: 3.4 mmol/L — ABNORMAL LOW (ref 3.5–5.1)
SODIUM: 140 mmol/L (ref 135–145)

## 2015-03-31 MED ORDER — ADULT MULTIVITAMIN W/MINERALS CH
1.0000 | ORAL_TABLET | Freq: Every day | ORAL | Status: DC
  Administered 2015-03-31 – 2015-04-04 (×5): 1 via ORAL
  Filled 2015-03-31 (×5): qty 1

## 2015-03-31 MED ORDER — VITAMIN B-1 100 MG PO TABS
100.0000 mg | ORAL_TABLET | Freq: Every day | ORAL | Status: DC
  Administered 2015-03-31 – 2015-04-04 (×5): 100 mg via ORAL
  Filled 2015-03-31 (×5): qty 1

## 2015-03-31 MED ORDER — METRONIDAZOLE IN NACL 5-0.79 MG/ML-% IV SOLN
500.0000 mg | Freq: Three times a day (TID) | INTRAVENOUS | Status: DC
Start: 2015-03-31 — End: 2015-04-04
  Administered 2015-03-31 – 2015-04-04 (×13): 500 mg via INTRAVENOUS
  Filled 2015-03-31 (×15): qty 100

## 2015-03-31 MED ORDER — ACETAMINOPHEN 650 MG RE SUPP
650.0000 mg | Freq: Four times a day (QID) | RECTAL | Status: DC | PRN
Start: 2015-03-31 — End: 2015-04-04

## 2015-03-31 MED ORDER — INFLUENZA VAC SPLIT QUAD 0.5 ML IM SUSY
0.5000 mL | PREFILLED_SYRINGE | INTRAMUSCULAR | Status: AC
  Administered 2015-04-03: 0.5 mL via INTRAMUSCULAR
  Filled 2015-03-31 (×2): qty 0.5

## 2015-03-31 MED ORDER — LABETALOL HCL 5 MG/ML IV SOLN
10.0000 mg | INTRAVENOUS | Status: DC | PRN
  Filled 2015-03-31: qty 4

## 2015-03-31 MED ORDER — CIPROFLOXACIN IN D5W 400 MG/200ML IV SOLN
400.0000 mg | Freq: Two times a day (BID) | INTRAVENOUS | Status: DC
  Administered 2015-03-31 – 2015-04-04 (×9): 400 mg via INTRAVENOUS
  Filled 2015-03-31 (×10): qty 200

## 2015-03-31 MED ORDER — ASPIRIN EC 81 MG PO TBEC
81.0000 mg | DELAYED_RELEASE_TABLET | Freq: Every day | ORAL | Status: DC
  Administered 2015-03-31 – 2015-04-04 (×5): 81 mg via ORAL
  Filled 2015-03-31 (×5): qty 1

## 2015-03-31 MED ORDER — ACETAMINOPHEN 325 MG PO TABS
650.0000 mg | ORAL_TABLET | Freq: Four times a day (QID) | ORAL | Status: DC | PRN
  Administered 2015-04-04: 650 mg via ORAL
  Filled 2015-03-31 (×2): qty 2

## 2015-03-31 MED ORDER — PROMETHAZINE HCL 25 MG/ML IJ SOLN
6.2500 mg | INTRAMUSCULAR | Status: DC | PRN
  Administered 2015-03-31 – 2015-04-04 (×8): 6.25 mg via INTRAVENOUS
  Filled 2015-03-31 (×8): qty 1

## 2015-03-31 MED ORDER — SODIUM CHLORIDE 0.9 % IV SOLN
INTRAVENOUS | Status: DC
  Administered 2015-03-31 – 2015-04-02 (×5): via INTRAVENOUS

## 2015-03-31 MED ORDER — ENOXAPARIN SODIUM 40 MG/0.4ML ~~LOC~~ SOLN
40.0000 mg | SUBCUTANEOUS | Status: DC
  Administered 2015-04-01 – 2015-04-04 (×4): 40 mg via SUBCUTANEOUS
  Filled 2015-03-31 (×5): qty 0.4

## 2015-03-31 NOTE — Care Management Note (Signed)
Case Management Note  Patient Details  Name: Charlotte Garcia MRN: [id number] Date of Birth: [birth year]  Subjective/Objective:     Admitted with diverticulitis               Action/Plan: Discharge planning, spoke with patient at bedside. Discussed need for primary care followup. Provided patient with resources for obtaining a PCP, patient knows about the orange card assistance program and plans to apply. Discussed medication assistance and using resources like [email or web address]/. Patient appreciative of assistance.   Expected Discharge Date:  04/03/15               Expected Discharge Plan:  Home/Self Care  In-House Referral:  PCP / [facility]  Discharge planning Services  CM Consult  Post Acute Care Choice:  NA Choice offered to:  NA  DME Arranged:  N/A DME Agency:  NA  HH Arranged:  NA HH Agency:  NA  Status of Service:  Completed, signed off  Medicare Important Message Given:    Date Medicare IM Given:    Medicare IM give by:    Date Additional Medicare IM Given:    Additional Medicare Important Message give by:     If discussed at [place] of Stay Meetings, dates discussed:    Additional Comments:  Guadalupe Maple, RN 03/31/2015, 3:17 PM

## 2015-03-31 NOTE — Progress Notes (Signed)
[facility] PROGRESS NOTE  [name] [id number] DOB: May 12, 1955 DOA: 03/30/2015 PCP: No PCP Per Patient  HPI: Charlotte Garcia is a 60 y.o. woman with a history of high blood pressure, active tobacco and EtOH use, and two prior episodes of diverticulitis who feels that she was in her baseline state of health until two days ago. She had had fever (reportedly up to 103 at home), chills, sweats, and left lower quadrant abdominal pain that radiates into her back.   In ED last pm, CT changes consistent with another episode of diverticulitis.  Assessment/Plan: 1. Acute diverticulitis /recurrent -slowly improving, continue IV cipro and flagyl -clears for now, supportive care -Needs outpatient GI referral for Colonoscopy, no role for inpatient eval at this time -needs PCP too, CM consulted  2. Accelerated HTN, untreated -resolved, likley due to #1 -not on any meds  3. Active tobacco use --Smoking cessation counseled  4. EToh abuse -add thiamine, MVI, no s/s of withdrawal  DVt proph: add lovenox  Code Status: FULL Communication: none at bedside Dispo: home in 1-2days  HPI/Subjective: Starting to feel better, still with pain, nausea  Objective: Filed Vitals:   03/31/15 0130 03/31/15 0547  BP: 138/75 122/84  Pulse: 85 70  Temp: 97.5 F (36.4 C) 97.6 F (36.4 C)  Resp: 16 15    Intake/Output Summary (Last 24 hours) at 03/31/15 1007 Last data filed at 03/31/15 0945  Gross per 24 hour  Intake 716.67 ml  Output   1000 ml  Net -283.33 ml   Filed Weights   03/31/15 0130  Weight: 129.4 kg (285 lb 4.4 oz)    Exam:   General:  AAOx3  Cardiovascular: S1S2/RRR  Respiratory: CTAB  Abdomen: soft, mild LLQ tenderness, BS present  Musculoskeletal: no edema c/c   Data Reviewed: Basic Metabolic Panel:  Recent Labs Lab 03/30/15 0920 03/31/15 0527  NA 139 140  K 3.5 3.4*  CL 103 109  CO2 25 23  GLUCOSE 112* 115*  BUN 9 9  CREATININE 0.53 0.59   CALCIUM 9.2 8.6*   Liver Function Tests:  Recent Labs Lab 03/30/15 0920  AST 13*  ALT 19  ALKPHOS 67  BILITOT 1.4*  PROT 7.2  ALBUMIN 4.2    Recent Labs Lab 03/30/15 0920  LIPASE 17   No results for input(s): AMMONIA in the last 168 hours. CBC:  Recent Labs Lab 03/30/15 0920 03/31/15 0527  WBC 15.6* 8.8  HGB 12.1 10.6*  HCT 37.1 33.0*  MCV 91.6 94.3  PLT 328 335   Cardiac Enzymes: No results for input(s): CKTOTAL, CKMB, CKMBINDEX, TROPONINI in the last 168 hours. BNP (last 3 results) No results for input(s): BNP in the last 8760 hours.  ProBNP (last 3 results) No results for input(s): PROBNP in the last 8760 hours.  CBG: No results for input(s): GLUCAP in the last 168 hours.  No results found for this or any previous visit (from the past 240 hour(s)).   Studies: Ct Abdomen Pelvis W Contrast  03/30/2015  CLINICAL DATA:  89 female with left lower quadrant abdominal pain EXAM: CT ABDOMEN AND PELVIS WITH CONTRAST TECHNIQUE: Multidetector CT imaging of the abdomen and pelvis was performed using the standard protocol following bolus administration of intravenous contrast. CONTRAST:  153mL OMNIPAQUE IOHEXOL 300 MG/ML  SOLN COMPARISON:  CT dated 11/06/2013 FINDINGS: The visualized lung bases are clear. No intra-abdominal free air or free fluid. Subcentimeter inferior right hepatic hypodense lesion is too small to characterize. The liver,  gallbladder, pancreas, spleen, adrenal glands, kidneys, visualized ureters, and urinary bladder appear unremarkable. The uterus is grossly unremarkable. There is sigmoid diverticulosis with muscular hypertrophy. There is inflammatory changes of the distal descending/sigmoid junction compatible with diverticulitis. No discrete drainable fluid collection/ abscess identified at this time. No extraluminal gas noted. There is no evidence of bowel obstruction. Normal appendix. The abdominal aorta and IVC appear patent. No portal venous gas  identified. There is no adenopathy. Small fat containing umbilical hernia. There is degenerative changes of the spine. No acute fracture. IMPRESSION: Diverticulitis of the distal descending/ sigmoid colon.  No abscess. Electronically Signed   By: Anner Crete M.D.   On: 03/30/2015 22:31    Scheduled Meds: . aspirin EC  81 mg Oral Daily  . ciprofloxacin  400 mg Intravenous Q12H  . [START ON 04/01/2015] Influenza vac split quadrivalent PF  0.5 mL Intramuscular Tomorrow-1000  . metronidazole  500 mg Intravenous Q8H   Continuous Infusions: . sodium chloride 100 mL/hr at 03/31/15 0553   Antibiotics Given (last 72 hours)    Date/Time Action Medication Dose Rate   03/31/15 0816 Given   metroNIDAZOLE (FLAGYL) IVPB 500 mg 500 mg 100 mL/hr   03/31/15 1000 Given   ciprofloxacin (CIPRO) IVPB 400 mg 400 mg 200 mL/hr      Principal Problem:   Acute diverticulitis Active Problems:   Hypertension   Tobacco abuse    Time spent: 67min    [name]  [facility] Pager [phone]. If 7PM-7AM, please contact night-coverage at [email or web address], password [facility] 03/31/2015, 10:07 AM  LOS: 1 day

## 2015-04-01 LAB — BASIC METABOLIC PANEL
Anion gap: 8 (ref 5–15)
BUN: 12 mg/dL (ref 6–20)
CALCIUM: 8.5 mg/dL — AB (ref 8.9–10.3)
CHLORIDE: 107 mmol/L (ref 101–111)
CO2: 26 mmol/L (ref 22–32)
CREATININE: 0.68 mg/dL (ref 0.44–1.00)
GFR calc non Af Amer: >60 mL/min (ref >60)
GLUCOSE: 109 mg/dL — AB (ref 65–99)
Potassium: 3.8 mmol/L (ref 3.5–5.1)
Sodium: 141 mmol/L (ref 135–145)

## 2015-04-01 LAB — CBC
HEMATOCRIT: 33.3 % — AB (ref 36.0–46.0)
HEMOGLOBIN: 10.5 g/dL — AB (ref 12.0–15.0)
MCH: 30.2 pg (ref 26.0–34.0)
MCHC: 31.5 g/dL (ref 30.0–36.0)
MCV: 95.7 fL (ref 78.0–100.0)
Platelets: 343 10*3/uL (ref 150–400)
RBC: 3.48 MIL/uL — ABNORMAL LOW (ref 3.87–5.11)
RDW: 14.6 % (ref 11.5–15.5)
WBC: 7.5 10*3/uL (ref 4.0–10.5)

## 2015-04-01 NOTE — Progress Notes (Signed)
[facility] PROGRESS NOTE  Charlotte Garcia [id number] DOB: 07-01-1955 DOA: 03/30/2015 PCP: No PCP Per Patient  HPI: Charlotte Garcia is a 60 y.o. woman with a history of high blood pressure, active tobacco and EtOH use, and two prior episodes of diverticulitis who feels that she was in her baseline state of health until two days ago. She had had fever (reportedly up to 103 at home), chills, sweats, and left lower quadrant abdominal pain that radiates into her back.   In ED last pm, CT changes consistent with another episode of diverticulitis.  Assessment/Plan: 1. Acute diverticulitis /recurrent -slowly improving, continue IV cipro and flagyl -had more pain and nausea overnight, will leave on clears for now asked RN to advance diet if improved later today -Needs outpatient GI referral for Colonoscopy, no role for inpatient eval at this time -needs PCP too, CM consulted, resources given  2. Accelerated HTN, untreated -resolved, likley due to #1 -not on any meds  3. Active tobacco use --Smoking cessation counseled  4. EToh abuse -continue  thiamine, MVI, no s/s of withdrawal  DVt proph: lovenox  Code Status: FULL Communication: none at bedside Dispo: home tomorrow if improved  HPI/Subjective: Had severe pain and nausea overnight, finally a little better this am  Objective: Filed Vitals:   04/01/15 1120 04/01/15 1400  BP: 158/90 124/68  Pulse:  79  Temp:  98 F (36.7 C)  Resp:  16    Intake/Output Summary (Last 24 hours) at 04/01/15 1436 Last data filed at 04/01/15 1400  Gross per 24 hour  Intake 4216.17 ml  Output   2075 ml  Net 2141.17 ml   Filed Weights   03/31/15 0130  Weight: 129.4 kg (285 lb 4.4 oz)    Exam:   General:  AAOx3  Cardiovascular: S1S2/RRR  Respiratory: CTAB  Abdomen: soft, mild LLQ tenderness, BS present  Musculoskeletal: no edema c/c   Data Reviewed: Basic Metabolic Panel:  Recent Labs Lab 03/30/15 0920  03/31/15 0527 04/01/15 0614  NA 139 140 141  K 3.5 3.4* 3.8  CL 103 109 107  CO2 25 23 26   GLUCOSE 112* 115* 109*  BUN 9 9 12   CREATININE 0.53 0.59 0.68  CALCIUM 9.2 8.6* 8.5*   Liver Function Tests:  Recent Labs Lab 03/30/15 0920  AST 13*  ALT 19  ALKPHOS 67  BILITOT 1.4*  PROT 7.2  ALBUMIN 4.2    Recent Labs Lab 03/30/15 0920  LIPASE 17   No results for input(s): AMMONIA in the last 168 hours. CBC:  Recent Labs Lab 03/30/15 0920 03/31/15 0527 04/01/15 0614  WBC 15.6* 8.8 7.5  HGB 12.1 10.6* 10.5*  HCT 37.1 33.0* 33.3*  MCV 91.6 94.3 95.7  PLT 328 335 343   Cardiac Enzymes: No results for input(s): CKTOTAL, CKMB, CKMBINDEX, TROPONINI in the last 168 hours. BNP (last 3 results) No results for input(s): BNP in the last 8760 hours.  ProBNP (last 3 results) No results for input(s): PROBNP in the last 8760 hours.  CBG: No results for input(s): GLUCAP in the last 168 hours.  No results found for this or any previous visit (from the past 240 hour(s)).   Studies: Ct Abdomen Pelvis W Contrast  03/30/2015  CLINICAL DATA:  23 female with left lower quadrant abdominal pain EXAM: CT ABDOMEN AND PELVIS WITH CONTRAST TECHNIQUE: Multidetector CT imaging of the abdomen and pelvis was performed using the standard protocol following bolus administration of intravenous contrast. CONTRAST:  172mL OMNIPAQUE  IOHEXOL 300 MG/ML  SOLN COMPARISON:  CT dated 11/06/2013 FINDINGS: The visualized lung bases are clear. No intra-abdominal free air or free fluid. Subcentimeter inferior right hepatic hypodense lesion is too small to characterize. The liver, gallbladder, pancreas, spleen, adrenal glands, kidneys, visualized ureters, and urinary bladder appear unremarkable. The uterus is grossly unremarkable. There is sigmoid diverticulosis with muscular hypertrophy. There is inflammatory changes of the distal descending/sigmoid junction compatible with diverticulitis. No discrete  drainable fluid collection/ abscess identified at this time. No extraluminal gas noted. There is no evidence of bowel obstruction. Normal appendix. The abdominal aorta and IVC appear patent. No portal venous gas identified. There is no adenopathy. Small fat containing umbilical hernia. There is degenerative changes of the spine. No acute fracture. IMPRESSION: Diverticulitis of the distal descending/ sigmoid colon.  No abscess. Electronically Signed   By: Anner Crete M.D.   On: 03/30/2015 22:31    Scheduled Meds: . aspirin EC  81 mg Oral Daily  . ciprofloxacin  400 mg Intravenous Q12H  . enoxaparin (LOVENOX) injection  40 mg Subcutaneous Q24H  . Influenza vac split quadrivalent PF  0.5 mL Intramuscular Tomorrow-1000  . metronidazole  500 mg Intravenous Q8H  . multivitamin with minerals  1 tablet Oral Daily  . thiamine  100 mg Oral Daily   Continuous Infusions: . sodium chloride 75 mL/hr at 03/31/15 1834   Antibiotics Given (last 72 hours)    Date/Time Action Medication Dose Rate   03/31/15 0816 Given   metroNIDAZOLE (FLAGYL) IVPB 500 mg 500 mg 100 mL/hr   03/31/15 1000 Given   ciprofloxacin (CIPRO) IVPB 400 mg 400 mg 200 mL/hr   03/31/15 1728 Given   metroNIDAZOLE (FLAGYL) IVPB 500 mg 500 mg 100 mL/hr   03/31/15 2159 Given   ciprofloxacin (CIPRO) IVPB 400 mg 400 mg 200 mL/hr   04/01/15 0033 Given   metroNIDAZOLE (FLAGYL) IVPB 500 mg 500 mg 100 mL/hr   04/01/15 0800 Given   metroNIDAZOLE (FLAGYL) IVPB 500 mg 500 mg 100 mL/hr   04/01/15 [id number] Given   ciprofloxacin (CIPRO) IVPB 400 mg 400 mg 200 mL/hr      Principal Problem:   Acute diverticulitis Active Problems:   Hypertension   Tobacco abuse    Time spent: 35min    Hallee Mckenny  [facility] Pager [phone]. If 7PM-7AM, please contact night-coverage at [email or web address], password [facility] 04/01/2015, 2:36 PM  LOS: 2 days

## 2015-04-02 LAB — CBC
HCT: 35 % — ABNORMAL LOW (ref 36.0–46.0)
HEMOGLOBIN: 11.1 g/dL — AB (ref 12.0–15.0)
MCH: 30.2 pg (ref 26.0–34.0)
MCHC: 31.7 g/dL (ref 30.0–36.0)
MCV: 95.1 fL (ref 78.0–100.0)
Platelets: 392 10*3/uL (ref 150–400)
RBC: 3.68 MIL/uL — AB (ref 3.87–5.11)
RDW: 14.2 % (ref 11.5–15.5)
WBC: 6.5 10*3/uL (ref 4.0–10.5)

## 2015-04-02 LAB — BASIC METABOLIC PANEL
Anion gap: 10 (ref 5–15)
BUN: 12 mg/dL (ref 6–20)
CHLORIDE: 106 mmol/L (ref 101–111)
CO2: 28 mmol/L (ref 22–32)
CREATININE: 0.64 mg/dL (ref 0.44–1.00)
Calcium: 9.2 mg/dL (ref 8.9–10.3)
GFR calc Af Amer: >60 mL/min (ref >60)
GFR calc non Af Amer: >60 mL/min (ref >60)
Glucose, Bld: 94 mg/dL (ref 65–99)
POTASSIUM: 4.2 mmol/L (ref 3.5–5.1)
SODIUM: 144 mmol/L (ref 135–145)

## 2015-04-02 MED ORDER — ALUM & MAG HYDROXIDE-SIMETH 200-200-20 MG/5ML PO SUSP
30.0000 mL | Freq: Once | ORAL | Status: AC
  Administered 2015-04-02: 30 mL via ORAL
  Filled 2015-04-02: qty 30

## 2015-04-02 NOTE — Progress Notes (Signed)
Patient ID: Charlotte Garcia, female   DOB: April 20, 1955, 60 y.o.   MRN: [id number]  [facility] PROGRESS NOTE  Charlotte Garcia [id number] DOB: 1955/12/23 DOA: 03/30/2015 PCP: No PCP Per Patient   Brief narrative:    60 y.o. woman with a history of high blood pressure, active tobacco and EtOH use, and two prior episodes of diverticulitis who feels that she was in her baseline state of health until two days PTA. She had had fever (reportedly up to 103 at home), chills, sweats, and left lower quadrant abdominal pain that radiated into her back.   In ED, CT changes consistent with another episode of diverticulitis.Pt started on Cipro and Flagyl and [facility] asked to admit for further evaluation.   Assessment/Plan:    1. Acute diverticulitis /recurrent - slowly improving, continue IV cipro and flagyl - says she is still not able to tolerate PO  - Needs outpatient GI referral for Colonoscopy, no role for inpatient eval at this time - needs PCP too, CM consulted  2. Accelerated HTN, untreated - likley due to #1 - reasonable inpatient control   3. Active tobacco use - Smoking cessation counseled  4. EtOH abuse - continue thiamine, MVI, no s/s of withdrawal  DVT prophylaxis - Lovenox SQ   Code Status: Full.  Family Communication:  plan of care discussed with the patient Disposition Plan: Home when able to tolerate PO, possibly by 11/17  IV access:  Peripheral IV  Procedures and diagnostic studies:    Ct Abdomen Pelvis W Contrast 03/30/2015  Diverticulitis of the distal descending/ sigmoid colon.  No abscess   Medical Consultants:  None  Other Consultants:  None  IAnti-Infectives:   Cipro 1/12 --> Flagyl 1/12 -->  Faye Ramsay, MD  [facility] Pager [phone]  If 7PM-7AM, please contact night-coverage [email or web address] Password [facility] 04/02/2015, 6:25 PM   LOS: 3 days   HPI/Subjective: No events overnight. Still with nausea and poor oral  intake.  Objective: Filed Vitals:   04/01/15 1755 04/01/15 2100 04/02/15 0600 04/02/15 1234  BP: 142/84 154/102 122/89 146/80  Pulse: 77 68 67 62  Temp: 98.5 F (36.9 C) 98.5 F (36.9 C) 98.6 F (37 C) 98.2 F (36.8 C)  TempSrc: Oral Oral Oral Oral  Resp: 18 16 16 16   Height:      Weight:      SpO2: 100% 97% 100% 100%    Intake/Output Summary (Last 24 hours) at 04/02/15 1825 Last data filed at 04/02/15 1400  Gross per 24 hour  Intake   2200 ml  Output   1800 ml  Net    400 ml    Exam:   General:  Pt is alert, follows commands appropriately, not in acute distress  Cardiovascular: Regular rate and rhythm, S1/S2, no murmurs, no rubs, no gallops  Respiratory: Clear to auscultation bilaterally, no wheezing, no crackles, no rhonchi  Abdomen: Soft, non tender, non distended, bowel sounds present, no guarding   Data Reviewed: Basic Metabolic Panel:  Recent Labs Lab 03/30/15 0920 03/31/15 0527 04/01/15 0614 04/02/15 0552  NA 139 140 141 144  K 3.5 3.4* 3.8 4.2  CL 103 109 107 106  CO2 25 23 26 28   GLUCOSE 112* 115* 109* 94  BUN 9 9 12 12   CREATININE 0.53 0.59 0.68 0.64  CALCIUM 9.2 8.6* 8.5* 9.2   Liver Function Tests:  Recent Labs Lab 03/30/15 0920  AST 13*  ALT 19  ALKPHOS 67  BILITOT 1.4*  PROT 7.2  ALBUMIN 4.2    Recent Labs Lab 03/30/15 0920  LIPASE 17   CBC:  Recent Labs Lab 03/30/15 0920 03/31/15 0527 04/01/15 0614 04/02/15 0552  WBC 15.6* 8.8 7.5 6.5  HGB 12.1 10.6* 10.5* 11.1*  HCT 37.1 33.0* 33.3* 35.0*  MCV 91.6 94.3 95.7 95.1  PLT 328 335 343 392   Scheduled Meds: . aspirin EC  81 mg Oral Daily  . ciprofloxacin  400 mg Intravenous Q12H  . enoxaparin (LOVENOX) injection  40 mg Subcutaneous Q24H  . Influenza vac split quadrivalent PF  0.5 mL Intramuscular Tomorrow-1000  . metronidazole  500 mg Intravenous Q8H  . multivitamin with minerals  1 tablet Oral Daily  . thiamine  100 mg Oral Daily   Continuous Infusions: .  sodium chloride 75 mL/hr at 04/02/15 [phone]

## 2015-04-03 DIAGNOSIS — K5792 Diverticulitis of intestine, part unspecified, without perforation or abscess without bleeding: Secondary | ICD-10-CM

## 2015-04-03 MED ORDER — TRAMADOL HCL 50 MG PO TABS: 50.0000 mg | ORAL_TABLET | Freq: Four times a day (QID) | ORAL | Status: DC | PRN

## 2015-04-03 MED ORDER — METRONIDAZOLE 500 MG PO TABS: 500.0000 mg | ORAL_TABLET | Freq: Three times a day (TID) | ORAL | Status: DC

## 2015-04-03 MED ORDER — THIAMINE HCL 100 MG PO TABS: 100.0000 mg | ORAL_TABLET | Freq: Every day | ORAL | Status: DC

## 2015-04-03 MED ORDER — CIPROFLOXACIN HCL 500 MG PO TABS: 500.0000 mg | ORAL_TABLET | Freq: Two times a day (BID) | ORAL | Status: DC

## 2015-04-03 NOTE — Discharge Summary (Addendum)
Physician Discharge Summary  BETHAN ADAMEK MRN: [id number] DOB/AGE: 11-21-1955 60 y.o.  PCP: No PCP Per Patient   Admit date: 03/30/2015 Discharge date: 04/03/2015  Discharge Diagnoses:     Principal Problem:   Acute diverticulitis Active Problems:   Hypertension   Tobacco abuse   Addendum: Patient wants to stay one more day and see how she tolerates soft diet   Follow-up recommendations Follow-up with PCP in 3-5 days , including all  additional recommended appointments as below Follow-up CBC, CMP in 3-5 days Patient may need an outpatient colonoscopy in one month to further evaluate recurrent diverticulitis, rule out underlying malignancy, this is to be arranged for by primary care provider Patient is being discharged home on oral antibiotics for another 8 days     Medication List    STOP taking these medications        naproxen sodium 220 MG tablet  Commonly known as:  ANAPROX      TAKE these medications        aspirin EC 81 MG tablet  Take 81 mg by mouth daily.     cetirizine 10 MG tablet  Commonly known as:  ZYRTEC  Take 10 mg by mouth daily as needed for allergies.     ciprofloxacin 500 MG tablet  Commonly known as:  CIPRO  Take 1 tablet (500 mg total) by mouth 2 (two) times daily.     metroNIDAZOLE 500 MG tablet  Commonly known as:  FLAGYL  Take 1 tablet (500 mg total) by mouth 3 (three) times daily.     thiamine 100 MG tablet  Take 1 tablet (100 mg total) by mouth daily.     traMADol 50 MG tablet  Commonly known as:  ULTRAM  Take 1 tablet (50 mg total) by mouth every 6 (six) hours as needed.         Discharge Condition: *Stable, patient requesting to go home  Discharge Instructions     Allergies  Allergen Reactions  . Sulfa Antibiotics Hives  . Codeine Nausea And Vomiting  . Morphine And Related Nausea Only      Disposition: 01-Home or Self Care   Consults:  None     Significant Diagnostic Studies:  Ct Abdomen  Pelvis W Contrast  03/30/2015  CLINICAL DATA:  1 female with left lower quadrant abdominal pain EXAM: CT ABDOMEN AND PELVIS WITH CONTRAST TECHNIQUE: Multidetector CT imaging of the abdomen and pelvis was performed using the standard protocol following bolus administration of intravenous contrast. CONTRAST:  162m OMNIPAQUE IOHEXOL 300 MG/ML  SOLN COMPARISON:  CT dated 11/06/2013 FINDINGS: The visualized lung bases are clear. No intra-abdominal free air or free fluid. Subcentimeter inferior right hepatic hypodense lesion is too small to characterize. The liver, gallbladder, pancreas, spleen, adrenal glands, kidneys, visualized ureters, and urinary bladder appear unremarkable. The uterus is grossly unremarkable. There is sigmoid diverticulosis with muscular hypertrophy. There is inflammatory changes of the distal descending/sigmoid junction compatible with diverticulitis. No discrete drainable fluid collection/ abscess identified at this time. No extraluminal gas noted. There is no evidence of bowel obstruction. Normal appendix. The abdominal aorta and IVC appear patent. No portal venous gas identified. There is no adenopathy. Small fat containing umbilical hernia. There is degenerative changes of the spine. No acute fracture. IMPRESSION: Diverticulitis of the distal descending/ sigmoid colon.  No abscess. Electronically Signed   By: AAnner CreteM.D.   On: 03/30/2015 22:31         FAutoliv  03/31/15 0130  Weight: 129.4 kg (285 lb 4.4 oz)     Microbiology: No results found for this or any previous visit (from the past 240 hour(s)).     Blood Culture No results found for: SDES, SPECREQUEST, CULT, REPTSTATUS    Labs: Results for orders placed or performed during the hospital encounter of 03/30/15 (from the past 48 hour(s))  CBC     Status: Abnormal   Collection Time: 04/02/15  5:52 AM  Result Value Ref Range   WBC 6.5 4.0 - 10.5 K/uL   RBC 3.68 (L) 3.87 - 5.11 MIL/uL    Hemoglobin 11.1 (L) 12.0 - 15.0 g/dL   HCT 35.0 (L) 36.0 - 46.0 %   MCV 95.1 78.0 - 100.0 fL   MCH 30.2 26.0 - 34.0 pg   MCHC 31.7 30.0 - 36.0 g/dL   RDW 14.2 11.5 - 15.5 %   Platelets 392 150 - 400 K/uL  Basic metabolic panel     Status: None   Collection Time: 04/02/15  5:52 AM  Result Value Ref Range   Sodium 144 135 - 145 mmol/L   Potassium 4.2 3.5 - 5.1 mmol/L   Chloride 106 101 - 111 mmol/L   CO2 28 22 - 32 mmol/L   Glucose, Bld 94 65 - 99 mg/dL   BUN 12 6 - 20 mg/dL   Creatinine, Ser 0.64 0.44 - 1.00 mg/dL   Calcium 9.2 8.9 - 10.3 mg/dL   GFR calc non Af Amer >60 >60 mL/min   GFR calc Af Amer >60 >60 mL/min    Comment: (NOTE) The eGFR has been calculated using the CKD EPI equation. This calculation has not been validated in all clinical situations. eGFR's persistently <60 mL/min signify possible Chronic Kidney Disease.    Anion gap 10 5 - 15     Lipid Panel     Component Value Date/Time   CHOL 165 11/07/2013 0600   TRIG 97 11/07/2013 0600   HDL 63 11/07/2013 0600   CHOLHDL 2.6 11/07/2013 0600   VLDL 19 11/07/2013 0600   LDLCALC 83 11/07/2013 0600     No results found for: HGBA1C   Lab Results  Component Value Date   LDLCALC 83 11/07/2013   CREATININE 0.64 04/02/2015     HPI :HPI: Charlotte Garcia is a 60 y.o. woman with a history of high blood pressure (currently not on any prescription medications for this; says blood pressure is typically exacerbated by anxiety or high-stress situations), active tobacco and EtOH use, and two prior episodes of diverticulitis who feels that she was in her baseline state of health until two days ago. She had had fever (reportedly up to 103 at home), chills, sweats, and left lower quadrant abdominal pain that radiates into her back. She has had decreased appetite, decreased PO intake, nausea but no significant vomiting, and decreased urine output. She has had light-headedness but no syncope/LOC. No chest pain or shortness  of breath. She lives at home alone, and she called EMS for transport to the ED today. Evaluation concerning for leukocytosis and CT changes consistent with another episode of diverticulitis. Hospitalist asked to admit.  Of note, she says this is her third episode of diverticulitis. She has never had a colonoscopy. She does not have a GI doctor.   HOSPITAL COURSE: 1. Acute diverticulitis /recurrent - slowly improving, received 4 days of IV cipro and flagyl, switched to oral antibiotics for another one week She tolerated soft diet - Needs outpatient  GI referral for Colonoscopy, no role for inpatient eval at this time Case management referral provider to establish PCP   2. Accelerated HTN, untreated - likley due to #1 - reasonable inpatient control   3. Active tobacco use - Smoking cessation counseled  4. EtOH abuse - continue thiamine, MVI, no s/s of withdrawal   Discharge Exam:    Blood pressure 138/78, pulse 67, temperature 98 F (36.7 C), temperature source Oral, resp. rate 15, height '5\' 2"'  (1.575 m), weight 129.4 kg (285 lb 4.4 oz), SpO2 99 %.  General: Awake and alert. Oriented to person, place, time and situation. NAD. Rather nontoxic appearing.  Eyes: PERRL bilaterally, conjunctiva are pink. EOMI.  ENT: Moist mucous membranes. No nasal drainage.  Neck: Supple.  Cardiovascular: NR/RR. No LE edema.  Respiratory: CTA bilaterally.  Abdomen: Soft and compressible but she has LLQ tenderness. No significant guarding, mild rebound tenderness. Bowel sounds are present.  Skin: Warm and dry.  Musculoskeletal: Moves all four extremities spontaneously.  Psychiatric: Normal affect.  Neurologic: No focal deficits.         Follow-up Information    Follow up with pcp. Schedule an appointment as soon as possible for a visit in 1 week.      SignedReyne Dumas 04/03/2015, 10:25 AM        Time spent >45 mins

## 2015-04-03 NOTE — Progress Notes (Signed)
Pt c/o nausea, vomiting and indigestion after eating soft food last night. Per previous order, I reduced her diet back to fulls.

## 2015-04-04 ENCOUNTER — Inpatient Hospital Stay (HOSPITAL_COMMUNITY): Payer: Self-pay

## 2015-04-04 LAB — COMPREHENSIVE METABOLIC PANEL
ALK PHOS: 57 U/L (ref 38–126)
ALT: 17 U/L (ref 14–54)
ANION GAP: 9 (ref 5–15)
AST: 16 U/L (ref 15–41)
Albumin: 3.8 g/dL (ref 3.5–5.0)
BUN: 12 mg/dL (ref 6–20)
CALCIUM: 9 mg/dL (ref 8.9–10.3)
CO2: 25 mmol/L (ref 22–32)
Chloride: 104 mmol/L (ref 101–111)
Creatinine, Ser: 0.69 mg/dL (ref 0.44–1.00)
GFR calc non Af Amer: >60 mL/min (ref >60)
Glucose, Bld: 106 mg/dL — ABNORMAL HIGH (ref 65–99)
POTASSIUM: 3.9 mmol/L (ref 3.5–5.1)
SODIUM: 138 mmol/L (ref 135–145)
TOTAL PROTEIN: 6.5 g/dL (ref 6.5–8.1)
Total Bilirubin: 0.7 mg/dL (ref 0.3–1.2)

## 2015-04-04 LAB — CBC
HCT: 36.6 % (ref 36.0–46.0)
HEMOGLOBIN: 11.9 g/dL — AB (ref 12.0–15.0)
MCH: 30.3 pg (ref 26.0–34.0)
MCHC: 32.5 g/dL (ref 30.0–36.0)
MCV: 93.1 fL (ref 78.0–100.0)
Platelets: 404 10*3/uL — ABNORMAL HIGH (ref 150–400)
RBC: 3.93 MIL/uL (ref 3.87–5.11)
RDW: 14.1 % (ref 11.5–15.5)
WBC: 7.4 10*3/uL (ref 4.0–10.5)

## 2015-04-04 MED ORDER — IOHEXOL 300 MG/ML  SOLN
25.0000 mL | INTRAMUSCULAR | Status: AC
Start: 2015-04-04 — End: 2015-04-04
  Administered 2015-04-04 (×2): 25 mL via ORAL

## 2015-04-04 MED ORDER — CIPROFLOXACIN HCL 500 MG PO TABS
500.0000 mg | ORAL_TABLET | Freq: Two times a day (BID) | ORAL | Status: DC
  Filled 2015-04-04 (×2): qty 1

## 2015-04-04 MED ORDER — METRONIDAZOLE 500 MG PO TABS
500.0000 mg | ORAL_TABLET | Freq: Three times a day (TID) | ORAL | Status: DC
Start: 2015-04-04 — End: 2015-04-04
  Administered 2015-04-04: 500 mg via ORAL
  Filled 2015-04-04 (×3): qty 1

## 2015-04-04 MED ORDER — OXYCODONE HCL 5 MG PO TABS: 5.0000 mg | ORAL_TABLET | ORAL | Status: DC | PRN

## 2015-04-04 MED ORDER — IOHEXOL 300 MG/ML  SOLN
100.0000 mL | Freq: Once | INTRAMUSCULAR | Status: AC | PRN
  Administered 2015-04-04: 100 mL via INTRAVENOUS

## 2015-04-04 NOTE — Progress Notes (Signed)
Discharge instructions reviewed with patient and copy given. Prescriptions X2 given to patient. Questions answered.

## 2015-04-04 NOTE — Discharge Summary (Addendum)
Physician Discharge Summary  Charlotte Garcia MRN: [id number] DOB/AGE: Jan 03, 1956 60 y.o.  PCP: No PCP Per Patient   Admit date: 03/30/2015 Discharge date: 04/04/2015  Discharge Diagnoses:     Principal Problem:   Acute diverticulitis Active Problems:   Hypertension   Tobacco abuse       Follow-up recommendations Follow-up with PCP in 3-5 days , including all  additional recommended appointments as below Follow-up CBC, CMP in 3-5 days Patient may need an outpatient colonoscopy in one month to further evaluate recurrent diverticulitis, rule out underlying malignancy, this is to be arranged for by primary care provider Patient is being discharged home on oral antibiotics for another 8 days     Medication List    STOP taking these medications        naproxen sodium 220 MG tablet  Commonly known as:  ANAPROX      TAKE these medications        aspirin EC 81 MG tablet  Take 81 mg by mouth daily.     cetirizine 10 MG tablet  Commonly known as:  ZYRTEC  Take 10 mg by mouth daily as needed for allergies.     ciprofloxacin 500 MG tablet  Commonly known as:  CIPRO  Take 1 tablet (500 mg total) by mouth 2 (two) times daily.     metroNIDAZOLE 500 MG tablet  Commonly known as:  FLAGYL  Take 1 tablet (500 mg total) by mouth 3 (three) times daily.     oxyCODONE 5 MG immediate release tablet  Commonly known as:  ROXICODONE  Take 1 tablet (5 mg total) by mouth every 4 (four) hours as needed for severe pain.     thiamine 100 MG tablet  Take 1 tablet (100 mg total) by mouth daily.     traMADol 50 MG tablet  Commonly known as:  ULTRAM  Take 1 tablet (50 mg total) by mouth every 6 (six) hours as needed.         Discharge Condition: *Stable, patient requesting to go home  Discharge Instructions   Discharge Instructions    Diet - low sodium heart healthy    Complete by:  As directed      Increase activity slowly    Complete by:  As directed             Allergies  Allergen Reactions  . Sulfa Antibiotics Hives  . Codeine Nausea And Vomiting  . Morphine And Related Nausea Only      Disposition: 01-Home or Self Care   Consults:  None     Significant Diagnostic Studies:  Ct Abdomen Pelvis W Contrast  03/30/2015  CLINICAL DATA:  60 female with left lower quadrant abdominal pain EXAM: CT ABDOMEN AND PELVIS WITH CONTRAST TECHNIQUE: Multidetector CT imaging of the abdomen and pelvis was performed using the standard protocol following bolus administration of intravenous contrast. CONTRAST:  121m OMNIPAQUE IOHEXOL 300 MG/ML  SOLN COMPARISON:  CT dated 11/06/2013 FINDINGS: The visualized lung bases are clear. No intra-abdominal free air or free fluid. Subcentimeter inferior right hepatic hypodense lesion is too small to characterize. The liver, gallbladder, pancreas, spleen, adrenal glands, kidneys, visualized ureters, and urinary bladder appear unremarkable. The uterus is grossly unremarkable. There is sigmoid diverticulosis with muscular hypertrophy. There is inflammatory changes of the distal descending/sigmoid junction compatible with diverticulitis. No discrete drainable fluid collection/ abscess identified at this time. No extraluminal gas noted. There is no evidence of bowel obstruction. Normal appendix. The abdominal aorta  and IVC appear patent. No portal venous gas identified. There is no adenopathy. Small fat containing umbilical hernia. There is degenerative changes of the spine. No acute fracture. IMPRESSION: Diverticulitis of the distal descending/ sigmoid colon.  No abscess. Electronically Signed   By: Anner Crete M.D.   On: 03/30/2015 22:31         Filed Weights   03/31/15 0130  Weight: 129.4 kg (285 lb 4.4 oz)     Microbiology: No results found for this or any previous visit (from the past 240 hour(s)).     Blood Culture No results found for: SDES, [place], CULT, REPTSTATUS    Labs: Results  for orders placed or performed during the hospital encounter of 03/30/15 (from the past 48 hour(s))  CBC     Status: Abnormal   Collection Time: 04/04/15  5:43 AM  Result Value Ref Range   WBC 7.4 4.0 - 10.5 K/uL   RBC 3.93 3.87 - 5.11 MIL/uL   Hemoglobin 11.9 (L) 12.0 - 15.0 g/dL   HCT 36.6 36.0 - 46.0 %   MCV 93.1 78.0 - 100.0 fL   MCH 30.3 26.0 - 34.0 pg   MCHC 32.5 30.0 - 36.0 g/dL   RDW 14.1 11.5 - 15.5 %   Platelets 404 (H) 150 - 400 K/uL  Comprehensive metabolic panel     Status: Abnormal   Collection Time: 04/04/15  5:43 AM  Result Value Ref Range   Sodium 138 135 - 145 mmol/L   Potassium 3.9 3.5 - 5.1 mmol/L   Chloride 104 101 - 111 mmol/L   CO2 25 22 - 32 mmol/L   Glucose, Bld 106 (H) 65 - 99 mg/dL   BUN 12 6 - 20 mg/dL   Creatinine, Ser 0.69 0.44 - 1.00 mg/dL   Calcium 9.0 8.9 - 10.3 mg/dL   Total Protein 6.5 6.5 - 8.1 g/dL   Albumin 3.8 3.5 - 5.0 g/dL   AST 16 15 - 41 U/L   ALT 17 14 - 54 U/L   Alkaline Phosphatase 57 38 - 126 U/L   Total Bilirubin 0.7 0.3 - 1.2 mg/dL   GFR calc non Af Amer >60 >60 mL/min   GFR calc Af Amer >60 >60 mL/min    Comment: (NOTE) The eGFR has been calculated using the CKD EPI equation. This calculation has not been validated in all clinical situations. eGFR's persistently <60 mL/min signify possible Chronic Kidney Disease.    Anion gap 9 5 - 15     Lipid Panel     Component Value Date/Time   CHOL 165 11/07/2013 0600   TRIG 97 11/07/2013 0600   HDL 63 11/07/2013 0600   CHOLHDL 2.6 11/07/2013 0600   VLDL 19 11/07/2013 0600   LDLCALC 83 11/07/2013 0600     No results found for: HGBA1C   Lab Results  Component Value Date   LDLCALC 83 11/07/2013   CREATININE 0.69 04/04/2015     HPI :HPI: Charlotte Garcia is a 60 y.o. woman with a history of high blood pressure (currently not on any prescription medications for this; says blood pressure is typically exacerbated by anxiety or high-stress situations), active tobacco and  EtOH use, and two prior episodes of diverticulitis who feels that she was in her baseline state of health until two days ago. She had had fever (reportedly up to 103 at home), chills, sweats, and left lower quadrant abdominal pain that radiates into her back. She has had decreased appetite, decreased  PO intake, nausea but no significant vomiting, and decreased urine output. She has had light-headedness but no syncope/LOC. No chest pain or shortness of breath. She lives at home alone, and she called EMS for transport to the ED today. Evaluation concerning for leukocytosis and CT changes consistent with another episode of diverticulitis. Hospitalist asked to admit.  Of note, she says this is her third episode of diverticulitis. She has never had a colonoscopy. She does not have a GI doctor.   HOSPITAL COURSE: 1. Acute diverticulitis /recurrent - slowly improving, received 5 days of IV cipro and flagyl, switched to oral antibiotics for another one week She tolerated soft diet but continues to have abdominal cramping , therefore repeat CT abd/pelvis was ordered to r/o perforation and abscess prior to dc - Needs outpatient GI referral for Colonoscopy, no role for inpatient eval at this time Case management referral provided  to establish PCP Patient to continue with by mouth antibiotics, instructed to return to the ER the patient has worsening abdominal pain, bloody diarrhea, high-grade fever   2. Accelerated HTN, untreated - likley due to #1 - reasonable inpatient control   3. Active tobacco use - Smoking cessation counseled  4. EtOH abuse - continue thiamine, MVI, no s/s of withdrawal   Discharge Exam:    Blood pressure 154/88, pulse 78, temperature 98.2 F (36.8 C), temperature source Oral, resp. rate 18, height '5\' 2"'  (1.575 m), weight 129.4 kg (285 lb 4.4 oz), SpO2 100 %.  General: Awake and alert. Oriented to person, place, time and situation. NAD. Rather nontoxic  appearing.  Eyes: PERRL bilaterally, conjunctiva are pink. EOMI.  ENT: Moist mucous membranes. No nasal drainage.  Neck: Supple.  Cardiovascular: NR/RR. No LE edema.  Respiratory: CTA bilaterally.  Abdomen: Soft and compressible but she has LLQ tenderness. No significant guarding, mild rebound tenderness. Bowel sounds are present.  Skin: Warm and dry.  Musculoskeletal: Moves all four extremities spontaneously.  Psychiatric: Normal affect.  Neurologic: No focal deficits.     Follow-up Information    Follow up with pcp. Schedule an appointment as soon as possible for a visit in 1 week.      SignedReyne Dumas 04/04/2015, 11:17 AM        Time spent >45 mins

## 2015-09-08 ENCOUNTER — Inpatient Hospital Stay (HOSPITAL_COMMUNITY): Payer: Self-pay

## 2015-09-08 ENCOUNTER — Encounter (HOSPITAL_COMMUNITY): Payer: Self-pay | Admitting: Emergency Medicine

## 2015-09-08 ENCOUNTER — Inpatient Hospital Stay (HOSPITAL_COMMUNITY)
Admission: EM | Admit: 2015-09-08 | Discharge: 2015-09-11 | DRG: 392 | Disposition: A | Discharge status: Home / Self Care | Payer: Self-pay | Attending: Family Medicine | Admitting: Family Medicine

## 2015-09-08 ENCOUNTER — Emergency Department (HOSPITAL_COMMUNITY): Payer: Self-pay

## 2015-09-08 DIAGNOSIS — R0600 Dyspnea, unspecified: Secondary | ICD-10-CM

## 2015-09-08 DIAGNOSIS — K5792 Diverticulitis of intestine, part unspecified, without perforation or abscess without bleeding: Secondary | ICD-10-CM | POA: Diagnosis present

## 2015-09-08 DIAGNOSIS — Z885 Allergy status to narcotic agent status: Secondary | ICD-10-CM

## 2015-09-08 DIAGNOSIS — K5793 Diverticulitis of intestine, part unspecified, without perforation or abscess with bleeding: Secondary | ICD-10-CM

## 2015-09-08 DIAGNOSIS — R0789 Other chest pain: Secondary | ICD-10-CM

## 2015-09-08 DIAGNOSIS — I10 Essential (primary) hypertension: Secondary | ICD-10-CM | POA: Diagnosis present

## 2015-09-08 DIAGNOSIS — K5720 Diverticulitis of large intestine with perforation and abscess without bleeding: Principal | ICD-10-CM | POA: Diagnosis present

## 2015-09-08 DIAGNOSIS — Z7982 Long term (current) use of aspirin: Secondary | ICD-10-CM

## 2015-09-08 DIAGNOSIS — Z79899 Other long term (current) drug therapy: Secondary | ICD-10-CM

## 2015-09-08 DIAGNOSIS — K921 Melena: Secondary | ICD-10-CM | POA: Diagnosis present

## 2015-09-08 DIAGNOSIS — Z882 Allergy status to sulfonamides status: Secondary | ICD-10-CM

## 2015-09-08 DIAGNOSIS — Z8249 Family history of ischemic heart disease and other diseases of the circulatory system: Secondary | ICD-10-CM

## 2015-09-08 DIAGNOSIS — F17210 Nicotine dependence, cigarettes, uncomplicated: Secondary | ICD-10-CM | POA: Diagnosis present

## 2015-09-08 DIAGNOSIS — E876 Hypokalemia: Secondary | ICD-10-CM

## 2015-09-08 DIAGNOSIS — D72829 Elevated white blood cell count, unspecified: Secondary | ICD-10-CM | POA: Diagnosis present

## 2015-09-08 DIAGNOSIS — Z841 Family history of disorders of kidney and ureter: Secondary | ICD-10-CM

## 2015-09-08 DIAGNOSIS — Z720 Tobacco use: Secondary | ICD-10-CM

## 2015-09-08 DIAGNOSIS — K5900 Constipation, unspecified: Secondary | ICD-10-CM | POA: Diagnosis present

## 2015-09-08 HISTORY — PX: TRANSTHORACIC ECHOCARDIOGRAM: SHX275

## 2015-09-08 LAB — CBC
HCT: 36.2 % (ref 36.0–46.0)
Hemoglobin: 12.6 g/dL (ref 12.0–15.0)
MCH: 30.9 pg (ref 26.0–34.0)
MCHC: 34.8 g/dL (ref 30.0–36.0)
MCV: 88.7 fL (ref 78.0–100.0)
Platelets: 384 K/uL (ref 150–400)
RBC: 4.08 MIL/uL (ref 3.87–5.11)
RDW: 14.3 % (ref 11.5–15.5)
WBC: 13.6 K/uL — ABNORMAL HIGH (ref 4.0–10.5)

## 2015-09-08 LAB — COMPREHENSIVE METABOLIC PANEL
ALT: 14 U/L (ref 14–54)
AST: 14 U/L — AB (ref 15–41)
Albumin: 4.1 g/dL (ref 3.5–5.0)
Alkaline Phosphatase: 66 U/L (ref 38–126)
Anion gap: 8 (ref 5–15)
BILIRUBIN TOTAL: 1.2 mg/dL (ref 0.3–1.2)
BUN: 8 mg/dL (ref 6–20)
CALCIUM: 8.9 mg/dL (ref 8.9–10.3)
CHLORIDE: 105 mmol/L (ref 101–111)
CO2: 25 mmol/L (ref 22–32)
CREATININE: 0.57 mg/dL (ref 0.44–1.00)
Glucose, Bld: 114 mg/dL — ABNORMAL HIGH (ref 65–99)
Potassium: 3.8 mmol/L (ref 3.5–5.1)
Sodium: 138 mmol/L (ref 135–145)
TOTAL PROTEIN: 7.3 g/dL (ref 6.5–8.1)

## 2015-09-08 LAB — ECHOCARDIOGRAM COMPLETE
E decel time: 211 msec
EERAT: 5.12
FS: 29 % (ref 28–44)
IV/PV OW: 0.95
LA diam end sys: 29 mm
LA diam index: 1.82 cm/m2
LA vol A4C: 24.7 ml
LA vol index: 16.3 mL/m2
LA vol: 25.9 mL
LASIZE: 29 mm
LDCA: 2.84 cm2
LV E/e' medial: 5.12
LV E/e'average: 5.12
LV PW d: 10.5 mm — AB (ref 0.6–1.1)
LV TDI E'LATERAL: 18.1
LVELAT: 18.1 cm/s
LVOTD: 19 mm
MV Dec: 211
MV Peak grad: 3 mmHg
MVPKAVEL: 86.3 m/s
MVPKEVEL: 92.6 m/s
TDI e' medial: 7.51

## 2015-09-08 LAB — URINALYSIS, ROUTINE W REFLEX MICROSCOPIC
Bilirubin Urine: NEGATIVE
Glucose, UA: NEGATIVE mg/dL
Hgb urine dipstick: NEGATIVE
KETONES UR: NEGATIVE mg/dL
LEUKOCYTES UA: NEGATIVE
NITRITE: NEGATIVE
PROTEIN: NEGATIVE mg/dL
Specific Gravity, Urine: >1.046 — ABNORMAL HIGH (ref 1.005–1.030)
pH: 7.5 (ref 5.0–8.0)

## 2015-09-08 LAB — LIPASE, BLOOD: Lipase: 17 U/L (ref 11–51)

## 2015-09-08 MED ORDER — SODIUM CHLORIDE 0.9 % IV SOLN
INTRAVENOUS | Status: DC
  Administered 2015-09-08: 11:00:00 via INTRAVENOUS

## 2015-09-08 MED ORDER — DEXTROSE-NACL 5-0.9 % IV SOLN
INTRAVENOUS | Status: DC
  Administered 2015-09-08 – 2015-09-11 (×3): via INTRAVENOUS

## 2015-09-08 MED ORDER — FAMOTIDINE IN NACL 20-0.9 MG/50ML-% IV SOLN
20.0000 mg | Freq: Two times a day (BID) | INTRAVENOUS | Status: DC
  Administered 2015-09-08 – 2015-09-10 (×6): 20 mg via INTRAVENOUS
  Filled 2015-09-08 (×7): qty 50

## 2015-09-08 MED ORDER — HYDROMORPHONE HCL 1 MG/ML IJ SOLN
0.5000 mg | INTRAMUSCULAR | Status: DC | PRN
  Administered 2015-09-09: 0.5 mg via INTRAVENOUS
  Filled 2015-09-08: qty 1

## 2015-09-08 MED ORDER — ACETAMINOPHEN 650 MG RE SUPP: 650.0000 mg | Freq: Four times a day (QID) | RECTAL | Status: DC | PRN

## 2015-09-08 MED ORDER — DIATRIZOATE MEGLUMINE & SODIUM 66-10 % PO SOLN: 15.0000 mL | Freq: Once | ORAL | Status: DC

## 2015-09-08 MED ORDER — DEXTROSE 5 % IV SOLN
2.0000 g | INTRAVENOUS | Status: DC
  Administered 2015-09-08 – 2015-09-10 (×3): 2 g via INTRAVENOUS
  Filled 2015-09-08 (×4): qty 2

## 2015-09-08 MED ORDER — SODIUM CHLORIDE 0.9 % IV SOLN
INTRAVENOUS | Status: AC
  Administered 2015-09-08: 16:00:00 via INTRAVENOUS

## 2015-09-08 MED ORDER — OXYCODONE HCL 5 MG PO TABS
5.0000 mg | ORAL_TABLET | ORAL | Status: DC | PRN
  Administered 2015-09-08 – 2015-09-10 (×4): 5 mg via ORAL
  Filled 2015-09-08 (×6): qty 1

## 2015-09-08 MED ORDER — HYDROMORPHONE HCL 1 MG/ML IJ SOLN: 1.0000 mg | INTRAMUSCULAR | Status: DC | PRN

## 2015-09-08 MED ORDER — ONDANSETRON HCL 4 MG/2ML IJ SOLN
4.0000 mg | Freq: Four times a day (QID) | INTRAMUSCULAR | Status: DC | PRN
  Administered 2015-09-09 (×2): 4 mg via INTRAVENOUS
  Filled 2015-09-08 (×2): qty 2

## 2015-09-08 MED ORDER — CIPROFLOXACIN IN D5W 400 MG/200ML IV SOLN
400.0000 mg | Freq: Once | INTRAVENOUS | Status: AC
  Administered 2015-09-08: 400 mg via INTRAVENOUS
  Filled 2015-09-08: qty 200

## 2015-09-08 MED ORDER — ALBUTEROL SULFATE (2.5 MG/3ML) 0.083% IN NEBU
2.5000 mg | INHALATION_SOLUTION | Freq: Four times a day (QID) | RESPIRATORY_TRACT | Status: DC | PRN
Start: 2015-09-08 — End: 2015-09-11

## 2015-09-08 MED ORDER — ONDANSETRON HCL 4 MG/2ML IJ SOLN
4.0000 mg | Freq: Once | INTRAMUSCULAR | Status: AC
  Administered 2015-09-08: 4 mg via INTRAVENOUS
  Filled 2015-09-08: qty 2

## 2015-09-08 MED ORDER — DIATRIZOATE MEGLUMINE & SODIUM 66-10 % PO SOLN
15.0000 mL | Freq: Once | ORAL | Status: AC
  Administered 2015-09-08: 15 mL via ORAL

## 2015-09-08 MED ORDER — ENOXAPARIN SODIUM 40 MG/0.4ML ~~LOC~~ SOLN
40.0000 mg | SUBCUTANEOUS | Status: DC
  Administered 2015-09-08 – 2015-09-10 (×3): 40 mg via SUBCUTANEOUS
  Filled 2015-09-08 (×4): qty 0.4

## 2015-09-08 MED ORDER — HYDROMORPHONE HCL 1 MG/ML IJ SOLN
1.0000 mg | Freq: Once | INTRAMUSCULAR | Status: AC
  Administered 2015-09-08: 1 mg via INTRAVENOUS
  Filled 2015-09-08: qty 1

## 2015-09-08 MED ORDER — LIP MEDEX EX OINT
TOPICAL_OINTMENT | CUTANEOUS | Status: AC
  Administered 2015-09-08: 17:00:00
  Filled 2015-09-08: qty 7

## 2015-09-08 MED ORDER — ONDANSETRON HCL 4 MG PO TABS: 4.0000 mg | ORAL_TABLET | Freq: Four times a day (QID) | ORAL | Status: DC | PRN

## 2015-09-08 MED ORDER — METRONIDAZOLE IN NACL 5-0.79 MG/ML-% IV SOLN
500.0000 mg | Freq: Once | INTRAVENOUS | Status: AC
  Administered 2015-09-08: 500 mg via INTRAVENOUS
  Filled 2015-09-08: qty 100

## 2015-09-08 MED ORDER — METRONIDAZOLE IN NACL 5-0.79 MG/ML-% IV SOLN
500.0000 mg | Freq: Three times a day (TID) | INTRAVENOUS | Status: DC
  Administered 2015-09-08 – 2015-09-11 (×8): 500 mg via INTRAVENOUS
  Filled 2015-09-08 (×9): qty 100

## 2015-09-08 MED ORDER — ONDANSETRON HCL 4 MG/2ML IJ SOLN: 4.0000 mg | Freq: Three times a day (TID) | INTRAMUSCULAR | Status: DC | PRN

## 2015-09-08 MED ORDER — ACETAMINOPHEN 325 MG PO TABS: 650.0000 mg | ORAL_TABLET | Freq: Four times a day (QID) | ORAL | Status: DC | PRN

## 2015-09-08 MED ORDER — LORATADINE 10 MG PO TABS
10.0000 mg | ORAL_TABLET | Freq: Every day | ORAL | Status: DC | PRN
  Filled 2015-09-08: qty 1

## 2015-09-08 MED ORDER — IOPAMIDOL (ISOVUE-300) INJECTION 61%
100.0000 mL | Freq: Once | INTRAVENOUS | Status: AC | PRN
  Administered 2015-09-08: 100 mL via INTRAVENOUS

## 2015-09-08 MED ORDER — HYDRALAZINE HCL 20 MG/ML IJ SOLN: 10.0000 mg | Freq: Four times a day (QID) | INTRAMUSCULAR | Status: DC | PRN

## 2015-09-08 NOTE — ED Notes (Signed)
[facility]: RLQ pain rad to back since yesterday. Abdomen soft and tender no BM in two days.Hx diverticulitis. Vitals stable.

## 2015-09-08 NOTE — Progress Notes (Addendum)
CM spoke with pt who confirms uninsured [employer] resident with no pcp.  This pt was seen by CM staff during last hospitalization but she did not f/u with finding a pcp Pt shared with this ED CM that she did not make the attempt to find a pcp and does not know where her list from her last hospitalization is located. CM discussed and provided written information to assist pt with determining choice for uninsured accepting pcps, discussed the importance of pcp vs EDP services for f/u care, www.needymeds.org, www.goodrx.com, discounted pharmacies and other [employer] such as [employer] [facility] [employer], affordable care act, financial assistance, uninsured dental services, [place] med assist, DSS and  health department  Reviewed resources for [employer] uninsured accepting pcps like [facility], family medicine at [address], [facility], [facility], local urgent care centers, [facility], [facility], general medical clinics, family services of the [place], [facility] urgent care plus others, medication resources, [facility] out patient [facility]ies and housing Pt voiced understanding and appreciation of resources provided  CM asked pt if she had a choice of one of the uninsured providers that the ED CM could contact for her today but the pt refused to look at the list at the time CM present in her room to make her choice Pt glasses noted on her lap on the bed CM left resources in pt bag at bedside CM informed pt she would be probably asked again about her preference or choice of uninsured provider by another CM Encouraged pt to take time to look at the uninsured list of providers   Provided [facility] contact information Pt agreed to a referral Cm completed referral Pt to be contact by [facility] clinical liaison

## 2015-09-08 NOTE — Progress Notes (Signed)
  Echocardiogram 2D Echocardiogram has been performed.  Diamond Nickel 09/08/2015, 3:05 PM

## 2015-09-08 NOTE — Progress Notes (Signed)
UR completed [facility] [facility] [employer] 

## 2015-09-08 NOTE — ED Notes (Signed)
MD at bedside. 

## 2015-09-08 NOTE — ED Notes (Signed)
Patient aware we need urine. Unable to provide specimen at this time 

## 2015-09-08 NOTE — H&P (Signed)
History and Physical    Charlotte Garcia [id number] DOB: 1955/10/21 DOA: 09/08/2015  PCP: No PCP Per Patient  Patient coming from: Home.   Chief Complaint:   HPI: Charlotte Garcia is a 60 y.o. female with medical history significant of HTN, prior history of diverticulitis, who presents with abdominal pain that started 2 days prior to admission. She report left lower quadrant, radiates to her side and lower back,sharp in quality, 7/10. It is accompanied by nausea, vomiting. Last BM two days ago.    ED Course: WBC 13, UA; negative, CT abdomen pelvis: Mild, uncomplicated sigmoid diverticulitis. There may be a small mural abscess, but there is no abscess beyond the wall of the sigmoid colon. No extraluminal air.  Review of Systems: As per HPI otherwise 10 point review of systems negative.  Report dyspnea for while,worseonexertion, she smokes only in the afternoon. Inhaler has help in the past for dyspnea.     Past Medical History  Diagnosis Date  . Hypertension   . Hypokalemia   . Murmur, heart   . Bronchitis   . Tobacco abuse   . Diverticulitis     Past Surgical History  Procedure Laterality Date  . Wrist surgery      gangular cyst  . Cystectomy      forehead; back of head  . Dilation and curettage of uterus       reports that she has been smoking Cigarettes.  She has a 10 pack-year smoking history. She has never used smokeless tobacco. She reports that she drinks alcohol. She reports that she does not use illicit drugs.  Allergies  Allergen Reactions  . Sulfa Antibiotics Hives  . Codeine Nausea And Vomiting  . Morphine And Related Nausea Only    Family History  Problem Relation Age of Onset  . Hypertension Father   . Heart disease Father   . Hypertension Mother   . Heart disease Mother   . Renal Disease Brother      Prior to Admission medications   Medication Sig Start Date End Date Taking? Authorizing Provider  aspirin EC 81 MG tablet Take 81 mg by mouth  daily.   Yes Historical Provider, MD  cetirizine (ZYRTEC) 10 MG tablet Take 10 mg by mouth daily as needed for allergies.   Yes Historical Provider, MD  thiamine 100 MG tablet Take 1 tablet (100 mg total) by mouth daily. 04/03/15  Yes Reyne Dumas, MD  traMADol (ULTRAM) 50 MG tablet Take 1 tablet (50 mg total) by mouth every 6 (six) hours as needed. Patient taking differently: Take 50 mg by mouth every 6 (six) hours as needed for moderate pain.  04/03/15  Yes Reyne Dumas, MD  oxyCODONE (ROXICODONE) 5 MG immediate release tablet Take 1 tablet (5 mg total) by mouth every 4 (four) hours as needed for severe pain. Patient not taking: Reported on 09/08/2015 04/04/15   Reyne Dumas, MD    Physical Exam: Filed Vitals:   09/08/15 0937 09/08/15 0938 09/08/15 1108  BP:  153/110 148/108  Pulse:  87 82  Temp:  98.6 F (37 C)   TempSrc:  Oral   Resp:  18 18  SpO2: 97% 96% 98%      Constitutional: NAD, calm, comfortable Filed Vitals:   09/08/15 0937 09/08/15 0938 09/08/15 1108  BP:  153/110 148/108  Pulse:  87 82  Temp:  98.6 F (37 C)   TempSrc:  Oral   Resp:  18 18  SpO2: 97% 96% 98%  Eyes: PERRL, lids and conjunctivae normal ENMT: Mucous membranes are moist. Posterior pharynx clear of any exudate or lesions.Normal dentition.  Neck: normal, supple, no masses, no thyromegaly Respiratory: clear to auscultation bilaterally, no wheezing, no crackles. Normal respiratory effort. No accessory muscle use.  Cardiovascular: Regular rate and rhythm, no murmurs / rubs / gallops. No extremity edema. 2+ pedal pulses. No carotid bruits.  Abdomen: left lower quadrant tenderness, , no masses palpated. No hepatosplenomegaly. Bowel sounds positive.  Musculoskeletal: no clubbing / cyanosis. No joint deformity upper and lower extremities. Good ROM, no contractures. Normal muscle tone.  Skin: no rashes, lesions, ulcers. No induration Neurologic: CN 2-12 grossly intact. Sensation intact, DTR normal. Strength  5/5 in all 4.  Psychiatric: Normal judgment and insight. Alert and oriented x 3. Normal mood.     Labs on Admission: I have personally reviewed following labs and imaging studies  CBC:  Recent Labs Lab 09/08/15 0953  WBC 13.6*  HGB 12.6  HCT 36.2  MCV 88.7  PLT 0000000   Basic Metabolic Panel:  Recent Labs Lab 09/08/15 0953  NA 138  K 3.8  CL 105  CO2 25  GLUCOSE 114*  BUN 8  CREATININE 0.57  CALCIUM 8.9   GFR: CrCl cannot be calculated (Unknown ideal weight.). Liver Function Tests:  Recent Labs Lab 09/08/15 0953  AST 14*  ALT 14  ALKPHOS 66  BILITOT 1.2  PROT 7.3  ALBUMIN 4.1    Recent Labs Lab 09/08/15 0953  LIPASE 17   No results for input(s): AMMONIA in the last 168 hours. Coagulation Profile: No results for input(s): INR, PROTIME in the last 168 hours. Cardiac Enzymes: No results for input(s): CKTOTAL, CKMB, CKMBINDEX, TROPONINI in the last 168 hours. BNP (last 3 results) No results for input(s): PROBNP in the last 8760 hours. HbA1C: No results for input(s): HGBA1C in the last 72 hours. CBG: No results for input(s): GLUCAP in the last 168 hours. Lipid Profile: No results for input(s): CHOL, HDL, LDLCALC, TRIG, CHOLHDL, LDLDIRECT in the last 72 hours. Thyroid Function Tests: No results for input(s): TSH, T4TOTAL, FREET4, T3FREE, THYROIDAB in the last 72 hours. Anemia Panel: No results for input(s): VITAMINB12, FOLATE, FERRITIN, TIBC, IRON, RETICCTPCT in the last 72 hours. Urine analysis:    Component Value Date/Time   COLORURINE YELLOW 09/08/2015 [place] 09/08/2015 1155   LABSPEC >1.046* 09/08/2015 1155   PHURINE 7.5 09/08/2015 1155   GLUCOSEU NEGATIVE 09/08/2015 1155   HGBUR NEGATIVE 09/08/2015 [place] 09/08/2015 1155   KETONESUR NEGATIVE 09/08/2015 1155   PROTEINUR NEGATIVE 09/08/2015 1155   UROBILINOGEN 1.0 11/06/2013 1803   NITRITE NEGATIVE 09/08/2015 1155   LEUKOCYTESUR NEGATIVE 09/08/2015  1155   Sepsis Labs: !!!!!!!!!!!!!!!!!!!!!!!!!!!!!!!!!!!!!!!!!!!! @LABRCNTIP (procalcitonin:4,lacticidven:4) )No results found for this or any previous visit (from the past 240 hour(s)).   Radiological Exams on Admission: Ct Abdomen Pelvis W Contrast  09/08/2015  CLINICAL DATA:  RLQ pain rad to back since yesterday. Abdomen soft and tender no BM in two days. Hx diverticulitis. EXAM: CT ABDOMEN AND PELVIS WITH CONTRAST TECHNIQUE: Multidetector CT imaging of the abdomen and pelvis was performed using the standard protocol following bolus administration of intravenous contrast. CONTRAST:  170mL ISOVUE-300 IOPAMIDOL (ISOVUE-300) INJECTION 61% COMPARISON:  04/04/2015 FINDINGS: Lung bases: Minor dependent subsegmental atelectasis. Otherwise clear. Heart normal in size. Hepatobiliary: 4 mm low-density lesion along the inferior aspect of the right lobe, stable consistent with a cyst. Liver otherwise unremarkable. Normal gallbladder. No bile duct dilation.  Spleen: Small somewhat irregular without a mass or focal lesion. Stable from the prior study. Pancreas and adrenal glands:  Unremarkable. Kidneys, ureters, bladder:  Normal. Uterus and adnexa: 2.2 cm sub serosal anterior right uterine fibroid, stable. Uterus otherwise unremarkable. No adnexal masses. Vascular: Mild atherosclerotic change along the abdominal aorta. No aneurysm. Lymph nodes:  No adenopathy. Ascites:  Trace fluid in the pelvis. Gastrointestinal: There is thickening of the posterior inferior wall of the sigmoid colon adjacent to the inflamed diverticulum. There is evidence of a poorly defined mural abscess measuring approximate 12 mm. There is no evidence of an abscess beyond the wall of the sigmoid colon and there is no extraluminal air. Numerous diverticula noted throughout the colon, most on the left. No other evidence of diverticulitis. Normal appendix visualized. Stomach and small bowel are unremarkable. Musculoskeletal: Disc degenerative changes of  the lower thoracic and upper lumbar spine, most evident at T11-T12. IMPRESSION: 1. Mild, uncomplicated sigmoid diverticulitis. There may be a small mural abscess, but there is no abscess beyond the wall of the sigmoid colon. No extraluminal air. 2. No other acute findings. Electronically Signed   By: Lajean Manes M.D.   On: 09/08/2015 11:40    EKG: not available.   Assessment/Plan Active Problems:   Acute diverticulitis   Hypertension   1-Acute Diverticulitis with possible mural wall abscess;  This is four episodes of diverticulitis per patient.  Admit to med-surgery.  IV fluids.  IV ceftriaxone and Flagyl.  NPO.  Surgery consulted.  She will need to follow up with GI for colonoscopy   2-HTN; not on medications. Will probably need prescription at discharge. IV hydralazine PRN/   3-Dyspnea; at rest and on exertion. She is current smoker. Will check chest xray. PRN albuterol. Will also check ECHO cardiogram    DVT prophylaxis: heparin  Code Status: full code.  Family Communication: care discussed with patient.  Disposition Plan: Home in 24 to 48 hours.  Consults called: surgery  Admission status: observation, med surgery    Niel Hummer A MD [facility] Pager [phone]  If 7PM-7AM, please contact night-coverage [email or web address] Password [facility]  09/08/2015, 12:40 PM

## 2015-09-08 NOTE — Consult Note (Signed)
Reason for Consult:Diverticulitis  Referring Physician: Dr. Niel Hummer  HPI: Charlotte Garcia is an 60 y.o. female with a history of diverticulitis (4th episode) that presented to the ED at [facility] with constant and crampy 10/10 LLQ abdominal pain for the past two days. Patient's last episode of diverticulitis occurred in Jan of 2017 and resulted in non-surgical management. Patient denies alleviating factors. Abdominal pain was associated with nausea and fever of 101. Patient had hematochezia today. Last bowel movement was two days ago. Denies colonoscopy. CT findings were consistent with sigmoid diverticulitis with a small mural abscess. WBC 13.6.   Past Medical History  Diagnosis Date  . Hypertension   . Hypokalemia   . Murmur, heart   . Bronchitis   . Tobacco abuse   . Diverticulitis     Past Surgical History  Procedure Laterality Date  . Wrist surgery      gangular cyst  . Cystectomy      forehead; back of head  . Dilation and curettage of uterus      Family History  Problem Relation Age of Onset  . Hypertension Father   . Heart disease Father   . Hypertension Mother   . Heart disease Mother   . Renal Disease Brother     Social History:  reports that she has been smoking Cigarettes.  She has a 10 pack-year smoking history. She has never used smokeless tobacco. She reports that she drinks alcohol. She reports that she does not use illicit drugs.  Allergies:  Allergies  Allergen Reactions  . Sulfa Antibiotics Hives  . Codeine Nausea And Vomiting  . Morphine And Related Nausea Only    Medications:  Prior to Admission medications   Medication Sig Start Date End Date Taking? Authorizing Provider  aspirin EC 81 MG tablet Take 81 mg by mouth daily.   Yes Historical Provider, MD  cetirizine (ZYRTEC) 10 MG tablet Take 10 mg by mouth daily as needed for allergies.   Yes Historical Provider, MD  thiamine 100 MG tablet Take 1 tablet (100 mg total) by mouth daily. 04/03/15  Yes  Reyne Dumas, MD  traMADol (ULTRAM) 50 MG tablet Take 1 tablet (50 mg total) by mouth every 6 (six) hours as needed. Patient taking differently: Take 50 mg by mouth every 6 (six) hours as needed for moderate pain.  04/03/15  Yes Reyne Dumas, MD  oxyCODONE (ROXICODONE) 5 MG immediate release tablet Take 1 tablet (5 mg total) by mouth every 4 (four) hours as needed for severe pain. Patient not taking: Reported on 09/08/2015 04/04/15   Reyne Dumas, MD     Results for orders placed or performed during the hospital encounter of 09/08/15 (from the past 48 hour(s))  Lipase, blood     Status: None   Collection Time: 09/08/15  9:53 AM  Result Value Ref Range   Lipase 17 11 - 51 U/L  Comprehensive metabolic panel     Status: Abnormal   Collection Time: 09/08/15  9:53 AM  Result Value Ref Range   Sodium 138 135 - 145 mmol/L   Potassium 3.8 3.5 - 5.1 mmol/L   Chloride 105 101 - 111 mmol/L   CO2 25 22 - 32 mmol/L   Glucose, Bld 114 (H) 65 - 99 mg/dL   BUN 8 6 - 20 mg/dL   Creatinine, Ser 0.57 0.44 - 1.00 mg/dL   Calcium 8.9 8.9 - 10.3 mg/dL   Total Protein 7.3 6.5 - 8.1 g/dL   Albumin 4.1  3.5 - 5.0 g/dL   AST 14 (L) 15 - 41 U/L   ALT 14 14 - 54 U/L   Alkaline Phosphatase 66 38 - 126 U/L   Total Bilirubin 1.2 0.3 - 1.2 mg/dL   GFR calc non Af Amer >60 >60 mL/min   GFR calc Af Amer >60 >60 mL/min    Comment: (NOTE) The eGFR has been calculated using the CKD EPI equation. This calculation has not been validated in all clinical situations. eGFR's persistently <60 mL/min signify possible Chronic Kidney Disease.    Anion gap 8 5 - 15  CBC     Status: Abnormal   Collection Time: 09/08/15  9:53 AM  Result Value Ref Range   WBC 13.6 (H) 4.0 - 10.5 K/uL   RBC 4.08 3.87 - 5.11 MIL/uL   Hemoglobin 12.6 12.0 - 15.0 g/dL   HCT 36.2 36.0 - 46.0 %   MCV 88.7 78.0 - 100.0 fL   MCH 30.9 26.0 - 34.0 pg   MCHC 34.8 30.0 - 36.0 g/dL   RDW 14.3 11.5 - 15.5 %   Platelets 384 150 - 400 K/uL  Urinalysis,  Routine w reflex microscopic     Status: Abnormal   Collection Time: 09/08/15 11:55 AM  Result Value Ref Range   Color, Urine YELLOW YELLOW   APPearance CLEAR CLEAR   Specific Gravity, Urine >1.046 (H) 1.005 - 1.030   pH 7.5 5.0 - 8.0   Glucose, UA NEGATIVE NEGATIVE mg/dL   Hgb urine dipstick NEGATIVE NEGATIVE   Bilirubin Urine NEGATIVE NEGATIVE   Ketones, ur NEGATIVE NEGATIVE mg/dL   Protein, ur NEGATIVE NEGATIVE mg/dL   Nitrite NEGATIVE NEGATIVE   Leukocytes, UA NEGATIVE NEGATIVE    Comment: MICROSCOPIC NOT DONE ON URINES WITH NEGATIVE PROTEIN, BLOOD, LEUKOCYTES, NITRITE, OR GLUCOSE <1000 mg/dL.    Ct Abdomen Pelvis W Contrast  09/08/2015  CLINICAL DATA:  RLQ pain rad to back since yesterday. Abdomen soft and tender no BM in two days. Hx diverticulitis. EXAM: CT ABDOMEN AND PELVIS WITH CONTRAST TECHNIQUE: Multidetector CT imaging of the abdomen and pelvis was performed using the standard protocol following bolus administration of intravenous contrast. CONTRAST:  124m ISOVUE-300 IOPAMIDOL (ISOVUE-300) INJECTION 61% COMPARISON:  04/04/2015 FINDINGS: Lung bases: Minor dependent subsegmental atelectasis. Otherwise clear. Heart normal in size. Hepatobiliary: 4 mm low-density lesion along the inferior aspect of the right lobe, stable consistent with a cyst. Liver otherwise unremarkable. Normal gallbladder. No bile duct dilation. Spleen: Small somewhat irregular without a mass or focal lesion. Stable from the prior study. Pancreas and adrenal glands:  Unremarkable. Kidneys, ureters, bladder:  Normal. Uterus and adnexa: 2.2 cm sub serosal anterior right uterine fibroid, stable. Uterus otherwise unremarkable. No adnexal masses. Vascular: Mild atherosclerotic change along the abdominal aorta. No aneurysm. Lymph nodes:  No adenopathy. Ascites:  Trace fluid in the pelvis. Gastrointestinal: There is thickening of the posterior inferior wall of the sigmoid colon adjacent to the inflamed diverticulum. There is  evidence of a poorly defined mural abscess measuring approximate 12 mm. There is no evidence of an abscess beyond the wall of the sigmoid colon and there is no extraluminal air. Numerous diverticula noted throughout the colon, most on the left. No other evidence of diverticulitis. Normal appendix visualized. Stomach and small bowel are unremarkable. Musculoskeletal: Disc degenerative changes of the lower thoracic and upper lumbar spine, most evident at T11-T12. IMPRESSION: 1. Mild, uncomplicated sigmoid diverticulitis. There may be a small mural abscess, but there is no abscess beyond  the wall of the sigmoid colon. No extraluminal air. 2. No other acute findings. Electronically Signed   By: Lajean Manes M.D.   On: 09/08/2015 11:40    Review of Systems  Constitutional: Positive for fever. Negative for weight loss.  Eyes: Negative for blurred vision.  Respiratory: Negative for cough.   Cardiovascular: Negative for chest pain.  Gastrointestinal: Positive for nausea, abdominal pain, constipation and blood in stool.  Genitourinary: Negative for dysuria, urgency and frequency.  Neurological: Negative for dizziness, tingling and headaches.   Blood pressure 148/108, pulse 82, temperature 98.6 F (37 C), temperature source Oral, resp. rate 18, SpO2 98 %. Physical Exam  Constitutional: She is oriented to person, place, and time. She appears well-developed and well-nourished.  HENT:  Head: Normocephalic and atraumatic.  Eyes: Conjunctivae and EOM are normal. Pupils are equal, round, and reactive to light.  Neck: Normal range of motion.  Cardiovascular: Normal rate, regular rhythm, normal heart sounds and intact distal pulses.  Exam reveals no gallop and no friction rub.   No murmur heard. Respiratory: Effort normal and breath sounds normal. No respiratory distress. She has no wheezes.  GI: Soft. Bowel sounds are normal. She exhibits no mass. There is tenderness. There is no guarding.  Tender to light  and deep palpation in LLQ.  Neurological: She is alert and oriented to person, place, and time.  Skin: Skin is warm and dry.   Assessment/Plan:  Diverticulitis with Mural Abscess  Patient has LLQ abdominal pain and fever  for the past two days. CT findings indicate sigmoid diverticulitis. WBC is 13.6. Admit for IV antibiotics and bowel rest. No indications for emergent surgery. Patient understands that if surgery is needed this admission, she would require Hartman's Procedure. I suspect she will get better with conservative management. She will need a colonoscopy in 6-8 weeks. Consideration for elective sigmoid colectomy thereafter.    AM labs  FEN: IV Fluids, NPO  ID: Rocephin and Zosyn  Dispo: OP follow up with Dr. Kieth Brightly. Thank you for consult. Will continue to follow.     Lannie Fields PA-S(II) 09/08/2015, 1:35 PM

## 2015-09-08 NOTE — ED Notes (Signed)
Echo being completed at bedside. Delay in transport to floor.

## 2015-09-08 NOTE — ED Notes (Signed)
In bathroom activley vomiting. Orders released from sign and held.

## 2015-09-08 NOTE — ED Provider Notes (Signed)
CSN: [id number]     Arrival date & time 09/08/15  [id number] History   First MD Initiated Contact with Patient 09/08/15 1014     Chief Complaint  Patient presents with  . Abdominal Pain      HPI Patient presents with chief complaint of lower abdominal pain associated with nausea chills and fever.  Started 2 days ago.  Has history of diverticulitis.  These symptoms feel similar to that episode.  Nausea but no definite vomiting.  Did have constipation pass gas associated with blood yesterday. Past Medical History  Diagnosis Date  . Hypertension   . Hypokalemia   . Murmur, heart   . Bronchitis   . Tobacco abuse   . Diverticulitis    Past Surgical History  Procedure Laterality Date  . Wrist surgery      gangular cyst  . Cystectomy      forehead; back of head  . Dilation and curettage of uterus     Family History  Problem Relation Age of Onset  . Hypertension Father   . Heart disease Father   . Hypertension Mother   . Heart disease Mother   . Renal Disease Brother    Social History  Substance Use Topics  . Smoking status: Current Some Day Smoker -- 0.50 packs/day for 20 years    Types: Cigarettes  . Smokeless tobacco: Never Used  . Alcohol Use: Yes     Comment: 1-2 glasses of wine 3 times a week   OB History    Gravida Para Term Preterm AB TAB SAB Ectopic Multiple Living   3 1 1  2  2   1      Review of Systems  Constitutional: Positive for fever and chills.  Gastrointestinal: Positive for nausea, constipation and blood in stool.  All other systems reviewed and are negative.     Allergies  Sulfa antibiotics; Codeine; and Morphine and related  Home Medications   Prior to Admission medications   Medication Sig Start Date End Date Taking? Authorizing Provider  aspirin EC 81 MG tablet Take 81 mg by mouth daily.   Yes Historical Provider, MD  cetirizine (ZYRTEC) 10 MG tablet Take 10 mg by mouth daily as needed for allergies.   Yes Historical Provider, MD  thiamine 100  MG tablet Take 1 tablet (100 mg total) by mouth daily. 04/03/15  Yes Reyne Dumas, MD  traMADol (ULTRAM) 50 MG tablet Take 1 tablet (50 mg total) by mouth every 6 (six) hours as needed. Patient taking differently: Take 50 mg by mouth every 6 (six) hours as needed for moderate pain.  04/03/15  Yes Reyne Dumas, MD  oxyCODONE (ROXICODONE) 5 MG immediate release tablet Take 1 tablet (5 mg total) by mouth every 4 (four) hours as needed for severe pain. Patient not taking: Reported on 09/08/2015 04/04/15   Reyne Dumas, MD   BP 148/108 mmHg  Pulse 82  Temp(Src) 98.6 F (37 C) (Oral)  Resp 18  SpO2 98% Physical Exam  Constitutional: She is oriented to person, place, and time. She appears well-developed and well-nourished. No distress.  HENT:  Head: Normocephalic and atraumatic.  Eyes: Pupils are equal, round, and reactive to light.  Neck: Normal range of motion.  Cardiovascular: Normal rate and intact distal pulses.   Pulmonary/Chest: No respiratory distress.  Abdominal: Normal appearance. She exhibits no distension. There is tenderness in the left lower quadrant. There is no rigidity, no rebound and no guarding.    Musculoskeletal: Normal range of  motion.  Neurological: She is alert and oriented to person, place, and time. No cranial nerve deficit.  Skin: Skin is warm and dry. No rash noted.  Psychiatric: She has a normal mood and affect. Her behavior is normal.  Nursing note and vitals reviewed.   ED Course  Procedures (including critical care time) Medications  0.9 %  sodium chloride infusion ( Intravenous New Bag/Given 09/08/15 1034)  diatrizoate meglumine-sodium (GASTROGRAFIN) 66-10 % solution 15 mL (not administered)  ciprofloxacin (CIPRO) IVPB 400 mg (400 mg Intravenous New Bag/Given 09/08/15 1234)  metroNIDAZOLE (FLAGYL) IVPB 500 mg (not administered)  HYDROmorphone (DILAUDID) injection 1 mg (1 mg Intravenous Given 09/08/15 1034)  ondansetron (ZOFRAN) injection 4 mg (4 mg Intravenous  Given 09/08/15 1034)  diatrizoate meglumine-sodium (GASTROGRAFIN) 66-10 % solution 15 mL (15 mLs Oral Given 09/08/15 1030)  iopamidol (ISOVUE-300) 61 % injection 100 mL (100 mLs Intravenous Contrast Given 09/08/15 1117)  ondansetron (ZOFRAN) injection 4 mg (4 mg Intravenous Given 09/08/15 1233)    Labs Review Labs Reviewed  COMPREHENSIVE METABOLIC PANEL - Abnormal; Notable for the following:    Glucose, Bld 114 (*)    AST 14 (*)    All other components within normal limits  CBC - Abnormal; Notable for the following:    WBC 13.6 (*)    All other components within normal limits  URINALYSIS, ROUTINE W REFLEX MICROSCOPIC (NOT AT [facility]) - Abnormal; Notable for the following:    Specific Gravity, Urine >1.046 (*)    All other components within normal limits  LIPASE, BLOOD    Imaging Review Ct Abdomen Pelvis W Contrast  09/08/2015  CLINICAL DATA:  RLQ pain rad to back since yesterday. Abdomen soft and tender no BM in two days. Hx diverticulitis. EXAM: CT ABDOMEN AND PELVIS WITH CONTRAST TECHNIQUE: Multidetector CT imaging of the abdomen and pelvis was performed using the standard protocol following bolus administration of intravenous contrast. CONTRAST:  146mL ISOVUE-300 IOPAMIDOL (ISOVUE-300) INJECTION 61% COMPARISON:  04/04/2015 FINDINGS: Lung bases: Minor dependent subsegmental atelectasis. Otherwise clear. Heart normal in size. Hepatobiliary: 4 mm low-density lesion along the inferior aspect of the right lobe, stable consistent with a cyst. Liver otherwise unremarkable. Normal gallbladder. No bile duct dilation. Spleen: Small somewhat irregular without a mass or focal lesion. Stable from the prior study. Pancreas and adrenal glands:  Unremarkable. Kidneys, ureters, bladder:  Normal. Uterus and adnexa: 2.2 cm sub serosal anterior right uterine fibroid, stable. Uterus otherwise unremarkable. No adnexal masses. Vascular: Mild atherosclerotic change along the abdominal aorta. No aneurysm. Lymph nodes:  No  adenopathy. Ascites:  Trace fluid in the pelvis. Gastrointestinal: There is thickening of the posterior inferior wall of the sigmoid colon adjacent to the inflamed diverticulum. There is evidence of a poorly defined mural abscess measuring approximate 12 mm. There is no evidence of an abscess beyond the wall of the sigmoid colon and there is no extraluminal air. Numerous diverticula noted throughout the colon, most on the left. No other evidence of diverticulitis. Normal appendix visualized. Stomach and small bowel are unremarkable. Musculoskeletal: Disc degenerative changes of the lower thoracic and upper lumbar spine, most evident at T11-T12. IMPRESSION: 1. Mild, uncomplicated sigmoid diverticulitis. There may be a small mural abscess, but there is no abscess beyond the wall of the sigmoid colon. No extraluminal air. 2. No other acute findings. Electronically Signed   By: Lajean Manes M.D.   On: 09/08/2015 11:40   I have personally reviewed and evaluated these images and lab results as part of  my medical decision-making.    MDM   Final diagnoses:  Diverticulitis of intestine without perforation or abscess with bleeding        Leonard Schwartz, MD 09/08/15 1250

## 2015-09-08 NOTE — ED Notes (Signed)
Bed: [id number] Expected date:  Expected time:  Means of arrival:  Comments: 60 yo LLQ pain, back pain-hx of diverticulitis

## 2015-09-09 LAB — CBC
HEMATOCRIT: 34.4 % — AB (ref 36.0–46.0)
Hemoglobin: 11.4 g/dL — ABNORMAL LOW (ref 12.0–15.0)
MCH: 29.8 pg (ref 26.0–34.0)
MCHC: 33.1 g/dL (ref 30.0–36.0)
MCV: 90.1 fL (ref 78.0–100.0)
Platelets: 367 10*3/uL (ref 150–400)
RBC: 3.82 MIL/uL — AB (ref 3.87–5.11)
RDW: 14.4 % (ref 11.5–15.5)
WBC: 9.1 10*3/uL (ref 4.0–10.5)

## 2015-09-09 LAB — COMPREHENSIVE METABOLIC PANEL
ALT: 13 U/L — AB (ref 14–54)
AST: 11 U/L — AB (ref 15–41)
Albumin: 3.5 g/dL (ref 3.5–5.0)
Alkaline Phosphatase: 56 U/L (ref 38–126)
Anion gap: 5 (ref 5–15)
BILIRUBIN TOTAL: 0.8 mg/dL (ref 0.3–1.2)
CALCIUM: 8.7 mg/dL — AB (ref 8.9–10.3)
CO2: 27 mmol/L (ref 22–32)
CREATININE: 0.61 mg/dL (ref 0.44–1.00)
Chloride: 108 mmol/L (ref 101–111)
Glucose, Bld: 115 mg/dL — ABNORMAL HIGH (ref 65–99)
Potassium: 3.4 mmol/L — ABNORMAL LOW (ref 3.5–5.1)
Sodium: 140 mmol/L (ref 135–145)
TOTAL PROTEIN: 6.4 g/dL — AB (ref 6.5–8.1)

## 2015-09-09 MED ORDER — AMLODIPINE BESYLATE 5 MG PO TABS
5.0000 mg | ORAL_TABLET | Freq: Every day | ORAL | Status: DC
  Administered 2015-09-09 – 2015-09-11 (×3): 5 mg via ORAL
  Filled 2015-09-09 (×3): qty 1

## 2015-09-09 MED ORDER — PROCHLORPERAZINE EDISYLATE 5 MG/ML IJ SOLN
5.0000 mg | Freq: Four times a day (QID) | INTRAMUSCULAR | Status: DC | PRN
  Administered 2015-09-09 – 2015-09-10 (×4): 5 mg via INTRAVENOUS
  Filled 2015-09-09 (×4): qty 2

## 2015-09-09 MED ORDER — POTASSIUM CHLORIDE 10 MEQ/100ML IV SOLN
10.0000 meq | INTRAVENOUS | Status: AC
  Administered 2015-09-09 (×2): 10 meq via INTRAVENOUS
  Filled 2015-09-09 (×3): qty 100

## 2015-09-09 NOTE — Progress Notes (Signed)
  Progress Note: General Surgery Service   Subjective: Pain unchanges, some nausea associated with medications  Objective: Vital signs in last 24 hours: Temp:  [98.4 F (36.9 C)-98.7 F (37.1 C)] 98.5 F (36.9 C) (06/24 0455) Pulse Rate:  [65-87] 65 (06/24 0455) Resp:  [16-116] 18 (06/24 0455) BP: (132-153)/(81-110) 150/92 mmHg (06/24 0455) SpO2:  [96 %-100 %] 98 % (06/24 0455) Weight:  [58.968 kg (130 lb)-64 kg (141 lb 1.5 oz)] 58.968 kg (130 lb) (06/24 0455) Last BM Date: 09/08/15  Intake/Output from previous day: 06/23 0701 - 06/24 0700 In: 1443.3 [I.V.:1143.3; IV Piggyback:300] Out: -  Intake/Output this shift:    Lungs: CTAB  Cardiovascular: RRR  Abd: soft, LLQ tenderness, nondistended, no guarding  Extremities: no edema  Neuro: AOx4  Lab Results: CBC   Recent Labs  09/08/15 0953 09/09/15 0523  WBC 13.6* 9.1  HGB 12.6 11.4*  HCT 36.2 34.4*  PLT 384 367   BMET  Recent Labs  09/08/15 0953 09/09/15 0523  NA 138 140  K 3.8 3.4*  CL 105 108  CO2 25 27  GLUCOSE 114* 115*  BUN 8 <5*  CREATININE 0.57 0.61  CALCIUM 8.9 8.7*   PT/INR No results for input(s): LABPROT, INR in the last 72 hours. ABG No results for input(s): PHART, HCO3 in the last 72 hours.  Invalid input(s): PCO2, PO2  Studies/Results:  Anti-infectives: Anti-infectives    Start     Dose/Rate Route Frequency Ordered Stop   09/08/15 2200  metroNIDAZOLE (FLAGYL) IVPB 500 mg     500 mg 100 mL/hr over 60 Minutes Intravenous Every 8 hours 09/08/15 1306     09/08/15 1330  cefTRIAXone (ROCEPHIN) 2 g in dextrose 5 % 50 mL IVPB     2 g 100 mL/hr over 30 Minutes Intravenous Every 24 hours 09/08/15 1306     09/08/15 1230  ciprofloxacin (CIPRO) IVPB 400 mg     400 mg 200 mL/hr over 60 Minutes Intravenous  Once 09/08/15 1215 09/08/15 1334   09/08/15 1230  metroNIDAZOLE (FLAGYL) IVPB 500 mg     500 mg 100 mL/hr over 60 Minutes Intravenous  Once 09/08/15 1215 09/08/15 1517       Medications: Scheduled Meds: . cefTRIAXone (ROCEPHIN)  IV  2 g Intravenous Q24H  . enoxaparin (LOVENOX) injection  40 mg Subcutaneous Q24H  . famotidine (PEPCID) IV  20 mg Intravenous Q12H  . metronidazole  500 mg Intravenous Q8H   Continuous Infusions: . dextrose 5 % and 0.9% NaCl 100 mL/hr at 09/08/15 1834   PRN Meds:.acetaminophen **OR** acetaminophen, albuterol, hydrALAZINE, HYDROmorphone (DILAUDID) injection, loratadine, ondansetron **OR** ondansetron (ZOFRAN) IV, oxyCODONE  Assessment/Plan: Patient Active Problem List   Diagnosis Date Noted  . Diverticulitis 11/06/2013  . Atypical chest pain 11/06/2013  . Hypokalemia 11/06/2013  . Acute diverticulitis 02/27/2013  . Hypertension   . Tobacco abuse    acute diverticulitis, leukocytosis resolved, still having pain -advance to clear liquids -continue abx   LOS: 1 day   Mickeal Skinner, MD Pg# [phone] [facility]

## 2015-09-09 NOTE — Progress Notes (Signed)
PROGRESS NOTE    AILA SAGEL  [id number] DOB: 23-Dec-1955 DOA: 09/08/2015 PCP: No PCP Per Patient  Brief Narrative: Charlotte Garcia is a 60 y.o. female with medical history significant of HTN, prior history of diverticulitis, who presents with abdominal pain that started 2 days prior to admission. She report left lower quadrant, radiates to her side and lower back,sharp in quality, 7/10. It is accompanied by nausea, vomiting. Last BM two days ago.   ED Course: WBC 13, UA; negative, CT abdomen pelvis: Mild, uncomplicated sigmoid diverticulitis. There may be a small mural abscess, but there is no abscess beyond the wall of the sigmoid colon. No extraluminal air.   Assessment & Plan:   Active Problems:   Acute diverticulitis   Hypertension   Diverticulitis  1-Acute Diverticulitis with possible mural wall abscess;  This is four episodes of diverticulitis per patient.  Continue with IV fluids.  IV ceftriaxone and Flagyl.  Surgery consulted and following.  She will need to follow up with GI for colonoscopy  Started on clear diet   2-HTN; not on medications. Will probably need prescription at discharge. IV hydralazine PRN/  Will start low dose Norvasc.   3-Dyspnea; at rest and on exertion. She is current smoker. chest x-ray no acute diseases. Marland Kitchen PRN albuterol.   ECHO normal.    DVT prophylaxis: lovenox.  Code Status: Full code.  Family Communication: care discussed with patient  Disposition Plan: remain inpatient    Consultants:   Surgery   Procedures:  ECHO; Left ventricle: The cavity size was normal. There was mild focal  basal hypertrophy of the septum. Systolic function was normal.  The estimated ejection fraction was in the range of 60% to 65%.  Wall motion was normal; there were no regional wall motion  abnormalities. Left ventricular diastolic function parameters  were normal. There was no evidence of elevated ventricular   filling pressure by  Doppler parameters.  Antimicrobials:   Ceftriaxone  Flagyl.    Subjective: Still with nausea and abdominal pain, sharp in quality, left Lower quadrant.  Having pain during urination   Objective: Filed Vitals:   09/08/15 1540 09/08/15 2031 09/09/15 0455 09/09/15 1142  BP: 146/88 133/81 150/92 156/89  Pulse: 67 78 65 63  Temp: 98.4 F (36.9 C) 98.7 F (37.1 C) 98.5 F (36.9 C) 98.3 F (36.8 C)  TempSrc: Oral Oral Oral Oral  Resp: 18 16 18 16   Height: 5\' 2"  (1.575 m)     Weight: 64 kg (141 lb 1.5 oz)  58.968 kg (130 lb)   SpO2: 100% 100% 98% 99%    Intake/Output Summary (Last 24 hours) at 09/09/15 1225 Last data filed at 09/09/15 0600  Gross per 24 hour  Intake 1443.33 ml  Output      0 ml  Net 1443.33 ml   Filed Weights   09/08/15 1540 09/09/15 0455  Weight: 64 kg (141 lb 1.5 oz) 58.968 kg (130 lb)    Examination:  General exam: Appears calm and comfortable  Respiratory system: Clear to auscultation. Respiratory effort normal. Cardiovascular system: S1 & S2 heard, RRR. No JVD, murmurs, rubs, gallops or clicks. No pedal edema. Gastrointestinal system: Abdomen is nondistended, soft and mild tender. No organomegaly or masses felt. Normal bowel sounds heard. Central nervous system: Alert and oriented. No focal neurological deficits. Extremities: Symmetric 5 x 5 power. Skin: No rashes, lesions or ulcers Psychiatry: Judgement and insight appear normal. Mood & affect appropriate.     Data Reviewed:  I have personally reviewed following labs and imaging studies  CBC:  Recent Labs Lab 09/08/15 0953 09/09/15 0523  WBC 13.6* 9.1  HGB 12.6 11.4*  HCT 36.2 34.4*  MCV 88.7 90.1  PLT 384 [id number]   Basic Metabolic Panel:  Recent Labs Lab 09/08/15 0953 09/09/15 0523  NA 138 140  K 3.8 3.4*  CL 105 108  CO2 25 27  GLUCOSE 114* 115*  BUN 8 <5*  CREATININE 0.57 0.61  CALCIUM 8.9 8.7*   GFR: Estimated Creatinine Clearance: 59.1 mL/min (by C-G formula based on  Cr of 0.61). Liver Function Tests:  Recent Labs Lab 09/08/15 0953 09/09/15 0523  AST 14* 11*  ALT 14 13*  ALKPHOS 66 56  BILITOT 1.2 0.8  PROT 7.3 6.4*  ALBUMIN 4.1 3.5    Recent Labs Lab 09/08/15 0953  LIPASE 17   No results for input(s): AMMONIA in the last 168 hours. Coagulation Profile: No results for input(s): INR, PROTIME in the last 168 hours. Cardiac Enzymes: No results for input(s): CKTOTAL, CKMB, CKMBINDEX, TROPONINI in the last 168 hours. BNP (last 3 results) No results for input(s): PROBNP in the last 8760 hours. HbA1C: No results for input(s): HGBA1C in the last 72 hours. CBG: No results for input(s): GLUCAP in the last 168 hours. Lipid Profile: No results for input(s): CHOL, HDL, LDLCALC, TRIG, CHOLHDL, LDLDIRECT in the last 72 hours. Thyroid Function Tests: No results for input(s): TSH, T4TOTAL, FREET4, T3FREE, THYROIDAB in the last 72 hours. Anemia Panel: No results for input(s): VITAMINB12, FOLATE, FERRITIN, TIBC, IRON, RETICCTPCT in the last 72 hours. Sepsis Labs: No results for input(s): PROCALCITON, LATICACIDVEN in the last 168 hours.  No results found for this or any previous visit (from the past 240 hour(s)).       Radiology Studies: Dg Chest 2 View  09/08/2015  CLINICAL DATA:  Shortness of breath for 2 weeks. EXAM: CHEST  2 VIEW COMPARISON:  PA and lateral chest 05/27/2013 and 02/27/2013. FINDINGS: The lungs are clear. Heart size is upper normal. No pneumothorax or pleural effusion. Lower thoracic spondylosis noted. IMPRESSION: No acute disease. Electronically Signed   By: Inge Rise M.D.   On: 09/08/2015 13:52   Ct Abdomen Pelvis W Contrast  09/08/2015  CLINICAL DATA:  RLQ pain rad to back since yesterday. Abdomen soft and tender no BM in two days. Hx diverticulitis. EXAM: CT ABDOMEN AND PELVIS WITH CONTRAST TECHNIQUE: Multidetector CT imaging of the abdomen and pelvis was performed using the standard protocol following bolus  administration of intravenous contrast. CONTRAST:  13mL ISOVUE-300 IOPAMIDOL (ISOVUE-300) INJECTION 61% COMPARISON:  04/04/2015 FINDINGS: Lung bases: Minor dependent subsegmental atelectasis. Otherwise clear. Heart normal in size. Hepatobiliary: 4 mm low-density lesion along the inferior aspect of the right lobe, stable consistent with a cyst. Liver otherwise unremarkable. Normal gallbladder. No bile duct dilation. Spleen: Small somewhat irregular without a mass or focal lesion. Stable from the prior study. Pancreas and adrenal glands:  Unremarkable. Kidneys, ureters, bladder:  Normal. Uterus and adnexa: 2.2 cm sub serosal anterior right uterine fibroid, stable. Uterus otherwise unremarkable. No adnexal masses. Vascular: Mild atherosclerotic change along the abdominal aorta. No aneurysm. Lymph nodes:  No adenopathy. Ascites:  Trace fluid in the pelvis. Gastrointestinal: There is thickening of the posterior inferior wall of the sigmoid colon adjacent to the inflamed diverticulum. There is evidence of a poorly defined mural abscess measuring approximate 12 mm. There is no evidence of an abscess beyond the wall of the sigmoid colon and  there is no extraluminal air. Numerous diverticula noted throughout the colon, most on the left. No other evidence of diverticulitis. Normal appendix visualized. Stomach and small bowel are unremarkable. Musculoskeletal: Disc degenerative changes of the lower thoracic and upper lumbar spine, most evident at T11-T12. IMPRESSION: 1. Mild, uncomplicated sigmoid diverticulitis. There may be a small mural abscess, but there is no abscess beyond the wall of the sigmoid colon. No extraluminal air. 2. No other acute findings. Electronically Signed   By: Lajean Manes M.D.   On: 09/08/2015 11:40        Scheduled Meds: . cefTRIAXone (ROCEPHIN)  IV  2 g Intravenous Q24H  . enoxaparin (LOVENOX) injection  40 mg Subcutaneous Q24H  . famotidine (PEPCID) IV  20 mg Intravenous Q12H  .  metronidazole  500 mg Intravenous Q8H  . potassium chloride  10 mEq Intravenous Q1 Hr x 3   Continuous Infusions: . dextrose 5 % and 0.9% NaCl 100 mL/hr at 09/08/15 1834     LOS: 1 day    Time spent: 35 minutes.     Elmarie Shiley, MD [facility] Pager [phone]  If 7PM-7AM, please contact night-coverage [email or web address] Password [facility] 09/09/2015, 12:25 PM

## 2015-09-10 DIAGNOSIS — I10 Essential (primary) hypertension: Secondary | ICD-10-CM

## 2015-09-10 DIAGNOSIS — K5792 Diverticulitis of intestine, part unspecified, without perforation or abscess without bleeding: Secondary | ICD-10-CM

## 2015-09-10 LAB — BASIC METABOLIC PANEL
ANION GAP: 4 — AB (ref 5–15)
BUN: <5 mg/dL — ABNORMAL LOW (ref 6–20)
CHLORIDE: 108 mmol/L (ref 101–111)
CO2: 29 mmol/L (ref 22–32)
Calcium: 8.7 mg/dL — ABNORMAL LOW (ref 8.9–10.3)
Creatinine, Ser: 0.64 mg/dL (ref 0.44–1.00)
Glucose, Bld: 113 mg/dL — ABNORMAL HIGH (ref 65–99)
POTASSIUM: 3.9 mmol/L (ref 3.5–5.1)
SODIUM: 141 mmol/L (ref 135–145)

## 2015-09-10 LAB — CBC
HCT: 34.5 % — ABNORMAL LOW (ref 36.0–46.0)
HEMOGLOBIN: 11.5 g/dL — AB (ref 12.0–15.0)
MCH: 30.1 pg (ref 26.0–34.0)
MCHC: 33.3 g/dL (ref 30.0–36.0)
MCV: 90.3 fL (ref 78.0–100.0)
PLATELETS: 379 10*3/uL (ref 150–400)
RBC: 3.82 MIL/uL — AB (ref 3.87–5.11)
RDW: 14.3 % (ref 11.5–15.5)
WBC: 7.7 10*3/uL (ref 4.0–10.5)

## 2015-09-10 NOTE — Progress Notes (Signed)
PROGRESS NOTE    Charlotte Garcia  [id number] DOB: [birth year] DOA: 09/08/2015 PCP: No PCP Per Patient    Brief Narrative:  60 year old presenting with uncomplicated sigmoid diverticulitis. There is mention of a possible small mural abscess on imaging study.  Assessment & Plan:   Active Problems:   Acute diverticulitis - Imaging study reports possible mural abscess. Gen. surgery consulted and is to continue current IV antibiotics for another day. With possible DC next a.m. with continued improvement in condition on oral antibiotics.    Hypertension - Stable on amlodipine   DVT prophylaxis: Lovenox Code Status: Full Family Communication: None at bedside Disposition Plan: Pending improvement in condition, most likely d/c next a.m. with continued improvement   Consultants:   General surgery   Procedures: None   Antimicrobials: Ceftriaxone and Flagyl   Subjective: Patient has no new complaints. No acute issues overnight   Objective: Filed Vitals:   09/09/15 1142 09/09/15 1615 09/09/15 2121 09/10/15 0537  BP: 156/89 151/87 131/95 138/83  Pulse: 63 72 87 67  Temp: 98.3 F (36.8 C) 98.8 F (37.1 C) 98.2 F (36.8 C) 98.1 F (36.7 C)  TempSrc: Oral Oral Oral Oral  Resp: 16 16 16 14   Height:      Weight:    60.8 kg (134 lb 0.6 oz)  SpO2: 99% 99% 100% 98%   No intake or output data in the 24 hours ending 09/10/15 1423 Filed Weights   09/08/15 1540 09/09/15 0455 09/10/15 0537  Weight: 64 kg (141 lb 1.5 oz) 58.968 kg (130 lb) 60.8 kg (134 lb 0.6 oz)    Examination:  General exam: Appears calm and comfortable  Respiratory system: Clear to auscultation. Respiratory effort normal. Cardiovascular system: S1 & S2 heard, RRR. No JVD, murmurs, rubs, gallops or clicks. No pedal edema. Gastrointestinal system: Abdomen is nondistended, soft.  Normal bowel sounds heard. Central nervous system: Alert and oriented. No focal neurological deficits. Extremities: Symmetric 5  x 5 power. Skin: No rashes, lesions or ulcers On limited exam Psychiatry: Judgement and insight appear normal. Mood & affect appropriate.     Data Reviewed: I have personally reviewed following labs and imaging studies  CBC:  Recent Labs Lab 09/08/15 0953 09/09/15 0523 09/10/15 0531  WBC 13.6* 9.1 7.7  HGB 12.6 11.4* 11.5*  HCT 36.2 34.4* 34.5*  MCV 88.7 90.1 90.3  PLT 384 367 [id number]   Basic Metabolic Panel:  Recent Labs Lab 09/08/15 0953 09/09/15 0523 09/10/15 0531  NA 138 140 141  K 3.8 3.4* 3.9  CL 105 108 108  CO2 25 27 29   GLUCOSE 114* 115* 113*  BUN 8 <5* <5*  CREATININE 0.57 0.61 0.64  CALCIUM 8.9 8.7* 8.7*   GFR: Estimated Creatinine Clearance: 64.2 mL/min (by C-G formula based on Cr of 0.64). Liver Function Tests:  Recent Labs Lab 09/08/15 0953 09/09/15 0523  AST 14* 11*  ALT 14 13*  ALKPHOS 66 56  BILITOT 1.2 0.8  PROT 7.3 6.4*  ALBUMIN 4.1 3.5    Recent Labs Lab 09/08/15 0953  LIPASE 17   No results for input(s): AMMONIA in the last 168 hours. Coagulation Profile: No results for input(s): INR, PROTIME in the last 168 hours. Cardiac Enzymes: No results for input(s): CKTOTAL, CKMB, CKMBINDEX, TROPONINI in the last 168 hours. BNP (last 3 results) No results for input(s): PROBNP in the last 8760 hours. HbA1C: No results for input(s): HGBA1C in the last 72 hours. CBG: No results for input(s): GLUCAP in the  last 168 hours. Lipid Profile: No results for input(s): CHOL, HDL, LDLCALC, TRIG, CHOLHDL, LDLDIRECT in the last 72 hours. Thyroid Function Tests: No results for input(s): TSH, T4TOTAL, FREET4, T3FREE, THYROIDAB in the last 72 hours. Anemia Panel: No results for input(s): VITAMINB12, FOLATE, FERRITIN, TIBC, IRON, RETICCTPCT in the last 72 hours. Sepsis Labs: No results for input(s): PROCALCITON, LATICACIDVEN in the last 168 hours.  No results found for this or any previous visit (from the past 240 hour(s)).       Radiology  Studies: No results found.      Scheduled Meds: . amLODipine  5 mg Oral Daily  . cefTRIAXone (ROCEPHIN)  IV  2 g Intravenous Q24H  . enoxaparin (LOVENOX) injection  40 mg Subcutaneous Q24H  . famotidine (PEPCID) IV  20 mg Intravenous Q12H  . metronidazole  500 mg Intravenous Q8H   Continuous Infusions: . dextrose 5 % and 0.9% NaCl 100 mL/hr at 09/10/15 1059     LOS: 2 days    Time spent: > 35 minutes  Velvet Bathe, MD [facility] Pager [phone]  If 7PM-7AM, please contact night-coverage [email or web address] Password [facility] 09/10/2015, 2:23 PM

## 2015-09-10 NOTE — Progress Notes (Signed)
LCSW called by RN as there is no formal consult for patient. LCSW met with patient at the bedside and assessed needs. Patient reports she lacks insurance, PCP, and is in need of financial assistance as she prepares in the next 6 weeks to have ?surgeries and procedures.  LCSW explained limited connect with medicaid disability, but can refer patient to [facility] and give information to apply for orange card. Pt did receive information in ED from CM regarding PCP and follow up.  LCSW will provide orange card information to patient and she can call and schedule intake appointment at DC.  Patient agreeable to plan.  Lane Hacker, MSW Clinical Social Work: [employer] [phone]

## 2015-09-10 NOTE — Progress Notes (Signed)
  Progress Note: General Surgery Service   Subjective: Pain slightly less, nausea resolved with taking liquids  Objective: Vital signs in last 24 hours: Temp:  [98.1 F (36.7 C)-98.8 F (37.1 C)] 98.1 F (36.7 C) (06/25 0537) Pulse Rate:  [63-87] 67 (06/25 0537) Resp:  [14-16] 14 (06/25 0537) BP: (131-156)/(83-95) 138/83 mmHg (06/25 0537) SpO2:  [98 %-100 %] 98 % (06/25 0537) Weight:  [60.8 kg (134 lb 0.6 oz)] 60.8 kg (134 lb 0.6 oz) (06/25 0537) Last BM Date: 09/08/15  Intake/Output from previous day:   Intake/Output this shift:    Lungs: CTAB  Cardiovascular: RRR  Abd: soft, pain on palpation suprapubic area, no guarding  Extremities: no edema  Neuro: AOx4  Lab Results: CBC   Recent Labs  09/09/15 0523 09/10/15 0531  WBC 9.1 7.7  HGB 11.4* 11.5*  HCT 34.4* 34.5*  PLT 367 379   BMET  Recent Labs  09/09/15 0523 09/10/15 0531  NA 140 141  K 3.4* 3.9  CL 108 108  CO2 27 29  GLUCOSE 115* 113*  BUN <5* <5*  CREATININE 0.61 0.64  CALCIUM 8.7* 8.7*   PT/INR No results for input(s): LABPROT, INR in the last 72 hours. ABG No results for input(s): PHART, HCO3 in the last 72 hours.  Invalid input(s): PCO2, PO2  Studies/Results:  Anti-infectives: Anti-infectives    Start     Dose/Rate Route Frequency Ordered Stop   09/08/15 2200  metroNIDAZOLE (FLAGYL) IVPB 500 mg     500 mg 100 mL/hr over 60 Minutes Intravenous Every 8 hours 09/08/15 1306     09/08/15 1330  cefTRIAXone (ROCEPHIN) 2 g in dextrose 5 % 50 mL IVPB     2 g 100 mL/hr over 30 Minutes Intravenous Every 24 hours 09/08/15 1306     09/08/15 1230  ciprofloxacin (CIPRO) IVPB 400 mg     400 mg 200 mL/hr over 60 Minutes Intravenous  Once 09/08/15 1215 09/08/15 1334   09/08/15 1230  metroNIDAZOLE (FLAGYL) IVPB 500 mg     500 mg 100 mL/hr over 60 Minutes Intravenous  Once 09/08/15 1215 09/08/15 1517      Medications: Scheduled Meds: . amLODipine  5 mg Oral Daily  . cefTRIAXone (ROCEPHIN)   IV  2 g Intravenous Q24H  . enoxaparin (LOVENOX) injection  40 mg Subcutaneous Q24H  . famotidine (PEPCID) IV  20 mg Intravenous Q12H  . metronidazole  500 mg Intravenous Q8H   Continuous Infusions: . dextrose 5 % and 0.9% NaCl 100 mL/hr at 09/08/15 1834   PRN Meds:.acetaminophen **OR** acetaminophen, albuterol, hydrALAZINE, HYDROmorphone (DILAUDID) injection, loratadine, ondansetron **OR** ondansetron (ZOFRAN) IV, oxyCODONE, prochlorperazine  Assessment/Plan: Patient Active Problem List   Diagnosis Date Noted  . Diverticulitis 11/06/2013  . Atypical chest pain 11/06/2013  . Hypokalemia 11/06/2013  . Acute diverticulitis 02/27/2013  . Hypertension   . Tobacco abuse   Pain and symptoms improving -continue abx -advance to fulls -likely home tomorrow   LOS: 2 days   Mickeal Skinner, MD Pg# [phone] [facility]

## 2015-09-11 MED ORDER — METRONIDAZOLE 500 MG PO TABS: 500.0000 mg | ORAL_TABLET | Freq: Three times a day (TID) | ORAL | Status: DC

## 2015-09-11 MED ORDER — FAMOTIDINE 20 MG PO TABS
20.0000 mg | ORAL_TABLET | Freq: Two times a day (BID) | ORAL | Status: DC
  Administered 2015-09-11: 20 mg via ORAL

## 2015-09-11 MED ORDER — CIPROFLOXACIN HCL 500 MG PO TABS: 500.0000 mg | ORAL_TABLET | Freq: Two times a day (BID) | ORAL | Status: DC

## 2015-09-11 MED ORDER — AMLODIPINE BESYLATE 5 MG PO TABS: 5.0000 mg | ORAL_TABLET | Freq: Every day | ORAL | Status: DC

## 2015-09-11 MED ORDER — OXYCODONE HCL 5 MG PO TABS: 5.0000 mg | ORAL_TABLET | ORAL | Status: DC | PRN

## 2015-09-11 MED ORDER — FAMOTIDINE 20 MG PO TABS: 20.0000 mg | ORAL_TABLET | Freq: Two times a day (BID) | ORAL | Status: DC

## 2015-09-11 NOTE — Progress Notes (Signed)
Pt discharged from the unit via wheelchair. Discharge instructions were reviewed with the pt. No questions or concerns at this time.  Keneshia Tena W Arelyn Gauer, RN 

## 2015-09-11 NOTE — Discharge Summary (Signed)
Physician Discharge Summary  Charlotte Garcia [id number] DOB: 27-Apr-1955 DOA: 09/08/2015  PCP: No PCP Per Patient  Admit date: 09/08/2015 Discharge date: 09/11/2015  Time spent: > 35 minutes  Recommendations for Outpatient Follow-up:  1. Please ensure patient f/u with general surgery. Needs colonoscopy in 6 wks  Discharge Diagnoses:  Active Problems:   Acute diverticulitis   Hypertension   Diverticulitis   Discharge Condition: stable  Diet recommendation: low sodium diet. Low residue  Filed Weights   09/09/15 0455 09/10/15 0537 09/11/15 0530  Weight: 58.968 kg (130 lb) 60.8 kg (134 lb 0.6 oz) 60.2 kg (132 lb 11.5 oz)    History of present illness:  60 year old presenting with uncomplicated sigmoid diverticulitis. There is mention of a possible small mural abscess on imaging study.  Hospital Course:  Recurrent acute diverticulitis - d/c on 13 more days of antibiotics (cipro and flagyl) to complete a 14 day total treatment course - f/u with general surgery as listed below.  Elevated BP - d/c on amlodipine  Procedures:  None  Consultations:  General surgery: Leighton Ruff  Discharge Exam: Filed Vitals:   09/11/15 0530 09/11/15 0803  BP: 153/98 129/69  Pulse: 64 91  Temp: 98.7 F (37.1 C) 98.2 F (36.8 C)  Resp: 17 18    General: Pt in nad, alert and awake Cardiovascular: rrr, no rubs Respiratory: no increased wob, no wheezes  Discharge Instructions   Discharge Instructions    Call MD for:  extreme fatigue    Complete by:  As directed      Call MD for:  severe uncontrolled pain    Complete by:  As directed      Call MD for:  temperature >100.4    Complete by:  As directed      Diet - low sodium heart healthy    Complete by:  As directed      Discharge instructions    Complete by:  As directed   Follow up with Dr. Kieth Brightly, 2-3 weeks. Colonoscopy in 6 weeks     Increase activity slowly    Complete by:  As directed           Current  Discharge Medication List    START taking these medications   Details  amLODipine (NORVASC) 5 MG tablet Take 1 tablet (5 mg total) by mouth daily. Qty: 30 tablet, Refills: 0    ciprofloxacin (CIPRO) 500 MG tablet Take 1 tablet (500 mg total) by mouth 2 (two) times daily. Qty: 26 tablet, Refills: 0    famotidine (PEPCID) 20 MG tablet Take 1 tablet (20 mg total) by mouth 2 (two) times daily. Qty: 60 tablet, Refills: 0    metroNIDAZOLE (FLAGYL) 500 MG tablet Take 1 tablet (500 mg total) by mouth 3 (three) times daily. Qty: 39 tablet, Refills: 0      CONTINUE these medications which have CHANGED   Details  oxyCODONE (ROXICODONE) 5 MG immediate release tablet Take 1 tablet (5 mg total) by mouth every 4 (four) hours as needed for severe pain. Qty: 15 tablet, Refills: 0      CONTINUE these medications which have NOT CHANGED   Details  cetirizine (ZYRTEC) 10 MG tablet Take 10 mg by mouth daily as needed for allergies.    thiamine 100 MG tablet Take 1 tablet (100 mg total) by mouth daily. Qty: 30 tablet, Refills: 0    traMADol (ULTRAM) 50 MG tablet Take 1 tablet (50 mg total) by mouth every 6 (six) hours  as needed. Qty: 45 tablet, Refills: 0      STOP taking these medications     aspirin EC 81 MG tablet        Allergies  Allergen Reactions  . Sulfa Antibiotics Hives  . Codeine Nausea And Vomiting  . Morphine And Related Nausea Only   Follow-up Information    Follow up with Mickeal Skinner, MD.   Specialty:  General Surgery   Why:  CAll and make an appointment in 2-3 weeks.   You will need a Colonoscopy in about 6 weeks.   Contact information:   [address] [place] [address] [phone]        The results of significant diagnostics from this hospitalization (including imaging, microbiology, ancillary and laboratory) are listed below for reference.    Significant Diagnostic Studies: Dg Chest 2 View  09/08/2015  CLINICAL DATA:  Shortness of  breath for 2 weeks. EXAM: CHEST  2 VIEW COMPARISON:  PA and lateral chest 05/27/2013 and 02/27/2013. FINDINGS: The lungs are clear. Heart size is upper normal. No pneumothorax or pleural effusion. Lower thoracic spondylosis noted. IMPRESSION: No acute disease. Electronically Signed   By: Inge Rise M.D.   On: 09/08/2015 13:52   Ct Abdomen Pelvis W Contrast  09/08/2015  CLINICAL DATA:  RLQ pain rad to back since yesterday. Abdomen soft and tender no BM in two days. Hx diverticulitis. EXAM: CT ABDOMEN AND PELVIS WITH CONTRAST TECHNIQUE: Multidetector CT imaging of the abdomen and pelvis was performed using the standard protocol following bolus administration of intravenous contrast. CONTRAST:  157mL ISOVUE-300 IOPAMIDOL (ISOVUE-300) INJECTION 61% COMPARISON:  04/04/2015 FINDINGS: Lung bases: Minor dependent subsegmental atelectasis. Otherwise clear. Heart normal in size. Hepatobiliary: 4 mm low-density lesion along the inferior aspect of the right lobe, stable consistent with a cyst. Liver otherwise unremarkable. Normal gallbladder. No bile duct dilation. Spleen: Small somewhat irregular without a mass or focal lesion. Stable from the prior study. Pancreas and adrenal glands:  Unremarkable. Kidneys, ureters, bladder:  Normal. Uterus and adnexa: 2.2 cm sub serosal anterior right uterine fibroid, stable. Uterus otherwise unremarkable. No adnexal masses. Vascular: Mild atherosclerotic change along the abdominal aorta. No aneurysm. Lymph nodes:  No adenopathy. Ascites:  Trace fluid in the pelvis. Gastrointestinal: There is thickening of the posterior inferior wall of the sigmoid colon adjacent to the inflamed diverticulum. There is evidence of a poorly defined mural abscess measuring approximate 12 mm. There is no evidence of an abscess beyond the wall of the sigmoid colon and there is no extraluminal air. Numerous diverticula noted throughout the colon, most on the left. No other evidence of diverticulitis.  Normal appendix visualized. Stomach and small bowel are unremarkable. Musculoskeletal: Disc degenerative changes of the lower thoracic and upper lumbar spine, most evident at T11-T12. IMPRESSION: 1. Mild, uncomplicated sigmoid diverticulitis. There may be a small mural abscess, but there is no abscess beyond the wall of the sigmoid colon. No extraluminal air. 2. No other acute findings. Electronically Signed   By: Lajean Manes M.D.   On: 09/08/2015 11:40    Microbiology: No results found for this or any previous visit (from the past 240 hour(s)).   Labs: Basic Metabolic Panel:  Recent Labs Lab 09/08/15 0953 09/09/15 0523 09/10/15 0531  NA 138 140 141  K 3.8 3.4* 3.9  CL 105 108 108  CO2 25 27 29   GLUCOSE 114* 115* 113*  BUN 8 <5* <5*  CREATININE 0.57 0.61 0.64  CALCIUM 8.9  8.7* 8.7*   Liver Function Tests:  Recent Labs Lab 09/08/15 0953 09/09/15 0523  AST 14* 11*  ALT 14 13*  ALKPHOS 66 56  BILITOT 1.2 0.8  PROT 7.3 6.4*  ALBUMIN 4.1 3.5    Recent Labs Lab 09/08/15 0953  LIPASE 17   No results for input(s): AMMONIA in the last 168 hours. CBC:  Recent Labs Lab 09/08/15 0953 09/09/15 0523 09/10/15 0531  WBC 13.6* 9.1 7.7  HGB 12.6 11.4* 11.5*  HCT 36.2 34.4* 34.5*  MCV 88.7 90.1 90.3  PLT 384 367 379   Cardiac Enzymes: No results for input(s): CKTOTAL, CKMB, CKMBINDEX, TROPONINI in the last 168 hours. BNP: BNP (last 3 results) No results for input(s): BNP in the last 8760 hours.  ProBNP (last 3 results) No results for input(s): PROBNP in the last 8760 hours.  CBG: No results for input(s): GLUCAP in the last 168 hours.   Signed:  Velvet Bathe MD.  [facility] 09/11/2015, 12:27 PM

## 2015-09-11 NOTE — Progress Notes (Signed)
  Subjective: Doing well and wants to go home.  Tolerating diet and having BM's  Objective: Vital signs in last 24 hours: Temp:  [98.2 F (36.8 C)-98.7 F (37.1 C)] 98.2 F (36.8 C) (06/26 0803) Pulse Rate:  [64-91] 91 (06/26 0803) Resp:  [15-18] 18 (06/26 0803) BP: (129-153)/(69-98) 129/69 mmHg (06/26 0803) SpO2:  [99 %-100 %] 100 % (06/26 0803) Weight:  [60.2 kg (132 lb 11.5 oz)] 60.2 kg (132 lb 11.5 oz) (06/26 0530) Last BM Date: 09/08/15  Intake/Output from previous day: 06/25 0701 - 06/26 0700 In: 2440 [P.O.:240; I.V.:2200] Out: -  Intake/Output this shift:    General appearance: alert, cooperative and no distress GI: soft, non-tender; bowel sounds normal; no masses,  no organomegaly  Lab Results:   Recent Labs  09/09/15 0523 09/10/15 0531  WBC 9.1 7.7  HGB 11.4* 11.5*  HCT 34.4* 34.5*  PLT 367 379    BMET  Recent Labs  09/09/15 0523 09/10/15 0531  NA 140 141  K 3.4* 3.9  CL 108 108  CO2 27 29  GLUCOSE 115* 113*  BUN <5* <5*  CREATININE 0.61 0.64  CALCIUM 8.7* 8.7*   PT/INR No results for input(s): LABPROT, INR in the last 72 hours.   Recent Labs Lab 09/08/15 0953 09/09/15 0523  AST 14* 11*  ALT 14 13*  ALKPHOS 66 56  BILITOT 1.2 0.8  PROT 7.3 6.4*  ALBUMIN 4.1 3.5     Lipase     Component Value Date/Time   LIPASE 17 09/08/2015 0953     Studies/Results: No results found. Prior to Admission medications   Medication Sig Start Date End Date Taking? Authorizing Provider  aspirin EC 81 MG tablet Take 81 mg by mouth daily.   Yes Historical Provider, MD  cetirizine (ZYRTEC) 10 MG tablet Take 10 mg by mouth daily as needed for allergies.   Yes Historical Provider, MD  thiamine 100 MG tablet Take 1 tablet (100 mg total) by mouth daily. 04/03/15  Yes Reyne Dumas, MD  traMADol (ULTRAM) 50 MG tablet Take 1 tablet (50 mg total) by mouth every 6 (six) hours as needed. Patient taking differently: Take 50 mg by mouth every 6 (six) hours as  needed for moderate pain.  04/03/15  Yes Reyne Dumas, MD  oxyCODONE (ROXICODONE) 5 MG immediate release tablet Take 1 tablet (5 mg total) by mouth every 4 (four) hours as needed for severe pain. Patient not taking: Reported on 09/08/2015 04/04/15   Reyne Dumas, MD    Medications: . amLODipine  5 mg Oral Daily  . cefTRIAXone (ROCEPHIN)  IV  2 g Intravenous Q24H  . enoxaparin (LOVENOX) injection  40 mg Subcutaneous Q24H  . famotidine (PEPCID) IV  20 mg Intravenous Q12H  . metronidazole  500 mg Intravenous Q8H    Assessment/Plan  Recurrent acute diverticulitis (4th  Episode) Ongoing tobacco use ID:   Cipro/ Flagyl today, completed 3 days of ceftriaxone/Flagyl FEN:  Full liquids; advance to soft DVT:  Lovenox  Plan:  Home today on total of 2 weeks antibiotics.  Follow up with Dr. Kieth Brightly, 2-3 weeks.   Colonoscopy in 6 weeks.  Seen and examined by Dr. Marcello Moores.      LOS: 3 days    Tkai Large 09/11/2015 [phone]

## 2015-12-08 ENCOUNTER — Ambulatory Visit (HOSPITAL_COMMUNITY)
Admission: EM | Admit: 2015-12-08 | Discharge: 2015-12-08 | Disposition: A | Discharge status: Home / Self Care | Payer: Self-pay | Attending: Family Medicine | Admitting: Family Medicine

## 2015-12-08 ENCOUNTER — Encounter (HOSPITAL_COMMUNITY): Payer: Self-pay | Admitting: Emergency Medicine

## 2015-12-08 DIAGNOSIS — K5792 Diverticulitis of intestine, part unspecified, without perforation or abscess without bleeding: Secondary | ICD-10-CM

## 2015-12-08 MED ORDER — CIPROFLOXACIN HCL 500 MG PO TABS: 500.0000 mg | ORAL_TABLET | Freq: Two times a day (BID) | ORAL | 0 refills | Status: DC

## 2015-12-08 MED ORDER — METRONIDAZOLE 500 MG PO TABS: 500.0000 mg | ORAL_TABLET | Freq: Three times a day (TID) | ORAL | 0 refills | Status: DC

## 2015-12-08 MED ORDER — ONDANSETRON 8 MG PO TBDP: 8.0000 mg | ORAL_TABLET | Freq: Three times a day (TID) | ORAL | 0 refills | Status: DC | PRN

## 2015-12-08 MED ORDER — OXYCODONE HCL 5 MG PO TABS: 5.0000 mg | ORAL_TABLET | ORAL | 0 refills | Status: DC | PRN

## 2015-12-08 NOTE — ED Provider Notes (Signed)
[place]    CSN: [id number] Arrival date & time: 12/08/15  1418  First Provider Contact:  First MD Initiated Contact with Patient 12/08/15 1501        History   Chief Complaint Chief Complaint  Patient presents with  . Abdominal Pain    HPI Charlotte Garcia is a 60 y.o. female.   This is a 60 year old woman here for evaluation of abdominal pain with a history of diverticulitis. She works for the [employer].  2 days ago, patient had some food that precipitated some diarrhea, followed by nausea and vomiting, followed by left lower quadrant pain. She says this is typical for her diverticulitis flare. She is keeping them broth and clear liquids now.  In the past she's been treated successfully with pain medicine, antinausea medicine and Cipro with Flagyl.      Past Medical History:  Diagnosis Date  . Bronchitis   . Diverticulitis   . Hypertension   . Hypokalemia   . Murmur, heart   . Tobacco abuse     Patient Active Problem List   Diagnosis Date Noted  . Diverticulitis 11/06/2013  . Atypical chest pain 11/06/2013  . Hypokalemia 11/06/2013  . Acute diverticulitis 02/27/2013  . Hypertension   . Tobacco abuse     Past Surgical History:  Procedure Laterality Date  . CYSTECTOMY     forehead; back of head  . DILATION AND CURETTAGE OF UTERUS    . WRIST SURGERY     gangular cyst    OB History    Gravida Para Term Preterm AB Living   3 1 1   2 1    SAB TAB Ectopic Multiple Live Births   2               Home Medications    Prior to Admission medications   Medication Sig Start Date End Date Taking? Authorizing Provider  amLODipine (NORVASC) 5 MG tablet Take 1 tablet (5 mg total) by mouth daily. 09/11/15   Velvet Bathe, MD  cetirizine (ZYRTEC) 10 MG tablet Take 10 mg by mouth daily as needed for allergies.    Historical Provider, MD  ciprofloxacin (CIPRO) 500 MG tablet Take 1 tablet (500 mg total) by mouth 2 (two) times daily.  12/08/15   Robyn Haber, MD  famotidine (PEPCID) 20 MG tablet Take 1 tablet (20 mg total) by mouth 2 (two) times daily. 09/11/15   Velvet Bathe, MD  metroNIDAZOLE (FLAGYL) 500 MG tablet Take 1 tablet (500 mg total) by mouth 3 (three) times daily. 12/08/15   Robyn Haber, MD  ondansetron (ZOFRAN-ODT) 8 MG disintegrating tablet Take 1 tablet (8 mg total) by mouth every 8 (eight) hours as needed for nausea. 12/08/15   Robyn Haber, MD  oxyCODONE (ROXICODONE) 5 MG immediate release tablet Take 1 tablet (5 mg total) by mouth every 4 (four) hours as needed for severe pain. 12/08/15   Robyn Haber, MD  thiamine 100 MG tablet Take 1 tablet (100 mg total) by mouth daily. 04/03/15   Reyne Dumas, MD    Family History Family History  Problem Relation Age of Onset  . Renal Disease Brother   . Hypertension Father   . Heart disease Father   . Hypertension Mother   . Heart disease Mother     Social History Social History  Substance Use Topics  . Smoking status: Current Some Day Smoker    Packs/day: 0.50    Years: 20.00  Types: Cigarettes  . Smokeless tobacco: Never Used  . Alcohol use Yes     Comment: 1-2 glasses of wine 3 times a week     Allergies   Sulfa antibiotics; Codeine; and Morphine and related   Review of Systems Review of Systems  Constitutional: Positive for fatigue. Negative for fever.  HENT: Negative.   Eyes: Negative.   Respiratory: Negative.   Cardiovascular: Negative.   Gastrointestinal: Positive for abdominal pain, diarrhea, nausea and vomiting.  Genitourinary: Negative.      Physical Exam Triage Vital Signs ED Triage Vitals [12/08/15 1439]  Enc Vitals Group     BP      Pulse Rate 74     Resp 12     Temp 99.3 F (37.4 C)     Temp Source Oral     SpO2 100 %     Weight      Height      Head Circumference      Peak Flow      Pain Score      Pain Loc      Pain Edu?      Excl. in [place]?    No data found.   Updated Vital Signs Pulse 74   Temp  99.3 F (37.4 C) (Oral)   Resp 12   SpO2 100%       Physical Exam  Constitutional: She is oriented to person, place, and time. She appears well-developed and well-nourished.  HENT:  Head: Normocephalic.  Right Ear: External ear normal.  Left Ear: External ear normal.  Mouth/Throat: Oropharynx is clear and moist.  Eyes: Conjunctivae are normal. Pupils are equal, round, and reactive to light.  Neck: Normal range of motion. Neck supple.  Cardiovascular: Normal rate, regular rhythm and normal heart sounds.   Pulmonary/Chest: Effort normal and breath sounds normal.  Abdominal: Soft. There is tenderness.  Tender left lower quadrant without mass. She has some guarding but no rebound.  Musculoskeletal: Normal range of motion.  Neurological: She is alert and oriented to person, place, and time.  Skin: Skin is warm and dry.  Nursing note and vitals reviewed.    UC Treatments / Results  Labs (all labs ordered are listed, but only abnormal results are displayed) Labs Reviewed - No data to display  EKG  EKG Interpretation None       Radiology No results found.  Procedures Procedures (including critical care time)  Medications Ordered in UC Medications - No data to display   Initial Impression / Assessment and Plan / UC Course  I have reviewed the triage vital signs and the nursing notes.  Pertinent labs & imaging results that were available during my care of the patient were reviewed by me and considered in my medical decision making (see chart for details).  Clinical Course      Final Clinical Impressions(s) / UC Diagnoses   Final diagnoses:  Acute diverticulitis    New Prescriptions New Prescriptions   ONDANSETRON (ZOFRAN-ODT) 8 MG DISINTEGRATING TABLET    Take 1 tablet (8 mg total) by mouth every 8 (eight) hours as needed for nausea.     Robyn Haber, MD 12/08/15 1517

## 2015-12-08 NOTE — ED Triage Notes (Signed)
Pt here for abd pain onset x3 days associated w/diarrhea, vomiting, and fevers  Reports hx of diverticulitis... Reports she needs "something for the pain"  A&O x4... NAD

## 2016-03-18 ENCOUNTER — Encounter (HOSPITAL_COMMUNITY): Payer: Self-pay | Admitting: Emergency Medicine

## 2016-03-18 ENCOUNTER — Ambulatory Visit (HOSPITAL_COMMUNITY)
Admission: EM | Admit: 2016-03-18 | Discharge: 2016-03-18 | Disposition: A | Discharge status: Home / Self Care | Payer: Managed Care, Other (non HMO) | Attending: Internal Medicine | Admitting: Internal Medicine

## 2016-03-18 DIAGNOSIS — K5733 Diverticulitis of large intestine without perforation or abscess with bleeding: Secondary | ICD-10-CM

## 2016-03-18 MED ORDER — CIPROFLOXACIN HCL 500 MG PO TABS: 500.0000 mg | ORAL_TABLET | Freq: Two times a day (BID) | ORAL | 0 refills | Status: DC

## 2016-03-18 MED ORDER — METRONIDAZOLE 500 MG PO TABS: 500.0000 mg | ORAL_TABLET | Freq: Two times a day (BID) | ORAL | 0 refills | Status: AC

## 2016-03-18 MED ORDER — OXYCODONE-ACETAMINOPHEN 5-325 MG PO TABS: 2.0000 | ORAL_TABLET | ORAL | 0 refills | Status: DC | PRN

## 2016-03-18 NOTE — ED Provider Notes (Signed)
[place]    CSN: [id number] Arrival date & time: 03/18/16  1253     History   Chief Complaint Chief Complaint  Patient presents with  . Abdominal Pain    HPI Charlotte Garcia is a 61 y.o. female. She presents today several hours after the onset of severe left lower quadrant discomfort, diarrhea, nausea and vomiting. Says this is typical of her presentations with diverticulitis.  She has been hospitalized several times in the past, and wanted to try to avoid this. She has responded well to Cipro and Flagyl in the past.  CT scan  of June 2017 did demonstrate diverticular disease.    HPI  Past Medical History:  Diagnosis Date  . Bronchitis   . Diverticulitis   . Hypertension   . Hypokalemia   . Murmur, heart   . Tobacco abuse     Patient Active Problem List   Diagnosis Date Noted  . Diverticulitis 11/06/2013  . Atypical chest pain 11/06/2013  . Hypokalemia 11/06/2013  . Acute diverticulitis 02/27/2013  . Hypertension   . Tobacco abuse     Past Surgical History:  Procedure Laterality Date  . CYSTECTOMY     forehead; back of head  . DILATION AND CURETTAGE OF UTERUS    . WRIST SURGERY     gangular cyst    OB History    Gravida Para Term Preterm AB Living   3 1 1   2 1    SAB TAB Ectopic Multiple Live Births   2               Home Medications    Prior to Admission medications   Medication Sig Start Date End Date Taking? Authorizing Provider  ciprofloxacin (CIPRO) 500 MG tablet Take 1 tablet (500 mg total) by mouth 2 (two) times daily. 03/18/16   Sherlene Shams, MD  metroNIDAZOLE (FLAGYL) 500 MG tablet Take 1 tablet (500 mg total) by mouth 2 (two) times daily. 03/18/16 03/28/16  Sherlene Shams, MD  oxyCODONE-acetaminophen (PERCOCET/ROXICET) 5-325 MG tablet Take 2 tablets by mouth every 4 (four) hours as needed for severe pain. 03/18/16   Sherlene Shams, MD    Family History Family History  Problem Relation Age of Onset  . Renal Disease Brother     . Hypertension Father   . Heart disease Father   . Hypertension Mother   . Heart disease Mother     Social History Social History  Substance Use Topics  . Smoking status: Current Some Day Smoker    Packs/day: 0.50    Years: 20.00    Types: Cigarettes  . Smokeless tobacco: Never Used  . Alcohol use Yes     Comment: 1-2 glasses of wine 3 times a week     Allergies   Sulfa antibiotics; Codeine; and Morphine and related   Review of Systems Review of Systems  All other systems reviewed and are negative.    Physical Exam Triage Vital Signs ED Triage Vitals  Enc Vitals Group     BP 03/18/16 1323 147/88     Pulse Rate 03/18/16 1323 76     Resp 03/18/16 1323 18     Temp 03/18/16 1323 98.9 F (37.2 C)     Temp Source 03/18/16 1323 Oral     SpO2 03/18/16 1323 100 %     Weight --      Height --      Pain Score 03/18/16 1328 10   Updated  Vital Signs BP 147/88 (BP Location: Left Arm)   Pulse 76   Temp 98.9 F (37.2 C) (Oral)   Resp 18   SpO2 100%  Physical Exam  Constitutional: She is oriented to person, place, and time. No distress.  Alert, nicely groomed Not doubled over  HENT:  Head: Atraumatic.  Eyes:  Conjugate gaze, no eye redness/drainage  Neck: Neck supple.  Cardiovascular: Normal rate and regular rhythm.   Pulmonary/Chest: No respiratory distress.  Lungs clear, symmetric breath sounds  Abdominal: Soft. She exhibits no distension. There is no rebound and no guarding.  Moderate left lower quadrant tenderness to deep palpation, no rebound, no peritoneal signs  Musculoskeletal: Normal range of motion.  No leg swelling  Neurological: She is alert and oriented to person, place, and time.  Skin: Skin is warm and dry.  No cyanosis  Nursing note and vitals reviewed.  Sitting up in bedside chair under a blanket.   UC Treatments / Results   Procedures Procedures (including critical care time) None today  Final Clinical Impressions(s) / UC Diagnoses    Final diagnoses:  Diverticulitis of large intestine without perforation or abscess with bleeding   Push fluids, rest.  Go to ER if abdominal discomfort, vomiting/diarrhea not starting to improve in the next 24 hours, for new fever >100.5, or if persistent bloody stools.    New Prescriptions New Prescriptions   CIPROFLOXACIN (CIPRO) 500 MG TABLET    Take 1 tablet (500 mg total) by mouth 2 (two) times daily.   METRONIDAZOLE (FLAGYL) 500 MG TABLET    Take 1 tablet (500 mg total) by mouth 2 (two) times daily.   OXYCODONE-ACETAMINOPHEN (PERCOCET/ROXICET) 5-325 MG TABLET    Take 2 tablets by mouth every 4 (four) hours as needed for severe pain.     Sherlene Shams, MD 03/19/16 2035

## 2016-03-18 NOTE — Discharge Instructions (Addendum)
Push fluids, rest.  Go to ER if abdominal discomfort, vomiting/diarrhea not starting to improve in the next 24 hours, for new fever >100.5, or if persistent bloody stools.

## 2016-03-18 NOTE — ED Triage Notes (Signed)
The patient presented to the [facility] with a complaint of abdominal pain with N/V/D that started this am. The patient has a hx of diverticulitis.

## 2016-04-01 ENCOUNTER — Telehealth (HOSPITAL_COMMUNITY): Payer: Self-pay | Admitting: *Deleted

## 2016-04-12 ENCOUNTER — Ambulatory Visit (INDEPENDENT_AMBULATORY_CARE_PROVIDER_SITE_OTHER): Payer: Self-pay | Admitting: Orthopaedic Surgery

## 2016-05-14 ENCOUNTER — Ambulatory Visit (INDEPENDENT_AMBULATORY_CARE_PROVIDER_SITE_OTHER): Payer: Self-pay | Admitting: Orthopaedic Surgery

## 2016-07-19 ENCOUNTER — Encounter: Payer: Self-pay | Admitting: Family Medicine

## 2017-07-31 ENCOUNTER — Encounter (HOSPITAL_COMMUNITY): Payer: Self-pay | Admitting: Nurse Practitioner

## 2017-07-31 ENCOUNTER — Emergency Department (HOSPITAL_COMMUNITY): Payer: Managed Care, Other (non HMO)

## 2017-07-31 ENCOUNTER — Emergency Department (HOSPITAL_COMMUNITY)
Admission: EM | Admit: 2017-07-31 | Discharge: 2017-08-01 | Disposition: A | Discharge status: Home / Self Care | Payer: Managed Care, Other (non HMO) | Attending: Emergency Medicine | Admitting: Emergency Medicine

## 2017-07-31 ENCOUNTER — Other Ambulatory Visit: Payer: Self-pay

## 2017-07-31 DIAGNOSIS — F17210 Nicotine dependence, cigarettes, uncomplicated: Secondary | ICD-10-CM | POA: Insufficient documentation

## 2017-07-31 DIAGNOSIS — K5792 Diverticulitis of intestine, part unspecified, without perforation or abscess without bleeding: Secondary | ICD-10-CM

## 2017-07-31 DIAGNOSIS — Z7982 Long term (current) use of aspirin: Secondary | ICD-10-CM | POA: Insufficient documentation

## 2017-07-31 DIAGNOSIS — I10 Essential (primary) hypertension: Secondary | ICD-10-CM | POA: Diagnosis not present

## 2017-07-31 DIAGNOSIS — Z79899 Other long term (current) drug therapy: Secondary | ICD-10-CM | POA: Insufficient documentation

## 2017-07-31 DIAGNOSIS — K5732 Diverticulitis of large intestine without perforation or abscess without bleeding: Secondary | ICD-10-CM | POA: Diagnosis not present

## 2017-07-31 DIAGNOSIS — R1030 Lower abdominal pain, unspecified: Secondary | ICD-10-CM | POA: Diagnosis present

## 2017-07-31 DIAGNOSIS — N898 Other specified noninflammatory disorders of vagina: Secondary | ICD-10-CM | POA: Diagnosis not present

## 2017-07-31 DIAGNOSIS — N3001 Acute cystitis with hematuria: Secondary | ICD-10-CM | POA: Diagnosis not present

## 2017-07-31 LAB — URINALYSIS, ROUTINE W REFLEX MICROSCOPIC
BILIRUBIN URINE: NEGATIVE
Glucose, UA: NEGATIVE mg/dL
KETONES UR: NEGATIVE mg/dL
NITRITE: NEGATIVE
PH: 7 (ref 5.0–8.0)
Protein, ur: NEGATIVE mg/dL
Specific Gravity, Urine: 1.018 (ref 1.005–1.030)
WBC, UA: >50 WBC/hpf — ABNORMAL HIGH (ref 0–5)

## 2017-07-31 LAB — WET PREP, GENITAL
Sperm: NONE SEEN
TRICH WET PREP: NONE SEEN
Yeast Wet Prep HPF POC: NONE SEEN

## 2017-07-31 LAB — COMPREHENSIVE METABOLIC PANEL
ALT: 13 U/L — ABNORMAL LOW (ref 14–54)
ANION GAP: 8 (ref 5–15)
AST: 13 U/L — AB (ref 15–41)
Albumin: 3.9 g/dL (ref 3.5–5.0)
Alkaline Phosphatase: 67 U/L (ref 38–126)
BUN: 14 mg/dL (ref 6–20)
CHLORIDE: 110 mmol/L (ref 101–111)
CO2: 26 mmol/L (ref 22–32)
Calcium: 9.1 mg/dL (ref 8.9–10.3)
Creatinine, Ser: 0.9 mg/dL (ref 0.44–1.00)
GFR calc non Af Amer: >60 mL/min (ref >60)
Glucose, Bld: 93 mg/dL (ref 65–99)
POTASSIUM: 3.8 mmol/L (ref 3.5–5.1)
Sodium: 144 mmol/L (ref 135–145)
Total Bilirubin: 0.4 mg/dL (ref 0.3–1.2)
Total Protein: 7.4 g/dL (ref 6.5–8.1)

## 2017-07-31 LAB — CBC WITH DIFFERENTIAL/PLATELET
BASOS ABS: 0 10*3/uL (ref 0.0–0.1)
BASOS PCT: 0 %
Eosinophils Absolute: 0.2 10*3/uL (ref 0.0–0.7)
Eosinophils Relative: 1 %
HEMATOCRIT: 39 % (ref 36.0–46.0)
Hemoglobin: 12.9 g/dL (ref 12.0–15.0)
LYMPHS PCT: 23 %
Lymphs Abs: 2.4 10*3/uL (ref 0.7–4.0)
MCH: 31.7 pg (ref 26.0–34.0)
MCHC: 33.1 g/dL (ref 30.0–36.0)
MCV: 95.8 fL (ref 78.0–100.0)
Monocytes Absolute: 0.7 10*3/uL (ref 0.1–1.0)
Monocytes Relative: 7 %
NEUTROS ABS: 7.1 10*3/uL (ref 1.7–7.7)
NEUTROS PCT: 69 %
PLATELETS: 358 10*3/uL (ref 150–400)
RBC: 4.07 MIL/uL (ref 3.87–5.11)
RDW: 14.5 % (ref 11.5–15.5)
WBC: 10.4 10*3/uL (ref 4.0–10.5)

## 2017-07-31 LAB — GROUP A STREP BY PCR: GROUP A STREP BY PCR: NOT DETECTED

## 2017-07-31 LAB — LIPASE, BLOOD: LIPASE: 30 U/L (ref 11–51)

## 2017-07-31 MED ORDER — IOPAMIDOL (ISOVUE-300) INJECTION 61%
100.0000 mL | Freq: Once | INTRAVENOUS | Status: AC | PRN
  Administered 2017-07-31: 100 mL via INTRAVENOUS

## 2017-07-31 MED ORDER — ONDANSETRON HCL 4 MG/2ML IJ SOLN
4.0000 mg | Freq: Once | INTRAMUSCULAR | Status: AC
  Administered 2017-07-31: 4 mg via INTRAVENOUS
  Filled 2017-07-31: qty 2

## 2017-07-31 MED ORDER — IOPAMIDOL (ISOVUE-300) INJECTION 61%
INTRAVENOUS | Status: AC
  Filled 2017-07-31: qty 100

## 2017-07-31 MED ORDER — MORPHINE SULFATE (PF) 4 MG/ML IV SOLN
4.0000 mg | Freq: Once | INTRAVENOUS | Status: AC
  Administered 2017-07-31: 4 mg via INTRAVENOUS
  Filled 2017-07-31: qty 1

## 2017-07-31 NOTE — ED Notes (Signed)
Pt requesting something for pain. Pt informed that the provider must see her and place orders before anything can be given for pain. Pt states " Well if they don't hurry up I am going to get my clothes on and leave. I am going to go somewhere else" Pt informed that the provider would be in shortly. This RN will go see if provider can prescribe pain meds.

## 2017-07-31 NOTE — ED Notes (Signed)
Pt reports abd pain, pain upon urination, vaginal bleeding, and cramping. Pt reports a hx of diverticulosis. Pt reports pain is worse than normal flares.

## 2017-07-31 NOTE — ED Notes (Signed)
Bed: OZ22 Expected date:  Expected time:  Means of arrival:  Comments: EMS 62 yo female flank pain/hx kidney stone in past-seen at urgent care and given Rx for pain med-states not helping

## 2017-07-31 NOTE — ED Provider Notes (Signed)
[facility] DEPT Provider Note   CSN: [id number] Arrival date & time: 07/31/17  1635     History   Chief Complaint No chief complaint on file.   HPI Charlotte Garcia is a 62 y.o. female with history of diverticulitis, tobacco use is here for evaluation of abdominal pain.  Onset 3 days ago.  Pain is described as "menstrual cramps", constant.  Pain is localized to lower abdomen.  Pain is moderate.  Associated symptoms include fevers, chills, sweats, nausea, nonbilious nonbloody emesis, watery stools nonbloody, dysuria.  Has also noticed a pink-tinged discharge from vagina after wiping and with urination.  Also reports her neck lymph nodes are swollen and she has a slight sore throat.  States the pain is similar to her diverticulitis however usually pain is on the LLQ, and she has never had vaginal spotting with diverticulitis before.  She has been doing a liquid diet for the last 3 days.  No other interventions PTA.  Aggravating factors include eating and palpation of the abdomen.  Last menstrual period at age 55.  No abdominal surgeries.  Has history of ovarian cysts and uterine fibroids.  She is sexually active with one female partner without condom use.  Cannot remember last time she was tested for STDs.    HPI  Past Medical History:  Diagnosis Date  . Bronchitis   . Diverticulitis   . Hypertension   . Hypokalemia   . Murmur, heart   . Tobacco abuse     Patient Active Problem List   Diagnosis Date Noted  . Diverticulitis 11/06/2013  . Atypical chest pain 11/06/2013  . Hypokalemia 11/06/2013  . Acute diverticulitis 02/27/2013  . Hypertension   . Tobacco abuse     Past Surgical History:  Procedure Laterality Date  . CYSTECTOMY     forehead; back of head  . DILATION AND CURETTAGE OF UTERUS    . WRIST SURGERY     gangular cyst     OB History    Gravida  3   Para  1   Term  1   Preterm      AB  2   Living  1     SAB  2   TAB      Ectopic      Multiple      Live Births               Home Medications    Prior to Admission medications   Medication Sig Start Date End Date Taking? Authorizing Provider  aspirin EC 81 MG tablet Take 81 mg by mouth daily as needed for moderate pain.   Yes [provider]  diphenhydramine-acetaminophen (TYLENOL PM) 25-500 MG TABS tablet Take 2 tablets by mouth at bedtime as needed (pain).   Yes [provider]  ciprofloxacin (CIPRO) 500 MG tablet Take 1 tablet (500 mg total) by mouth 2 (two) times daily for 7 days. 08/01/17 08/08/17  Kinnie Feil, PA-C  metroNIDAZOLE (FLAGYL) 500 MG tablet Take 1 tablet (500 mg total) by mouth 3 (three) times daily for 7 days. 08/01/17 08/08/17  Kinnie Feil, PA-C  ondansetron (ZOFRAN ODT) 4 MG disintegrating tablet Take 1 tablet (4 mg total) by mouth every 8 (eight) hours as needed for nausea or vomiting. 08/01/17   Kinnie Feil, PA-C  traMADol (ULTRAM) 50 MG tablet Take 1 tablet (50 mg total) by mouth every 6 (six) hours as needed for up to 3 days.  08/01/17 08/04/17  Kinnie Feil, PA-C    Family History Family History  Problem Relation Age of Onset  . Renal Disease Brother   . Hypertension Father   . Heart disease Father   . Hypertension Mother   . Heart disease Mother     Social History Social History   Tobacco Use  . Smoking status: Current Some Day Smoker    Packs/day: 0.50    Years: 20.00    Pack years: 10.00    Types: Cigarettes  . Smokeless tobacco: Never Used  Substance Use Topics  . Alcohol use: Yes    Comment: 1-2 glasses of wine 3 times a week  . Drug use: No     Allergies   Sulfa antibiotics; Codeine; and Morphine and related   Review of Systems Review of Systems  Constitutional: Positive for chills, diaphoresis and fever.  HENT: Positive for sore throat.   Gastrointestinal: Positive for abdominal pain, diarrhea, nausea and vomiting.  Genitourinary: Positive for dysuria and  vaginal discharge.  All other systems reviewed and are negative.    Physical Exam Updated Vital Signs BP (!) 149/104   Pulse 76   Temp 98.6 F (37 C) (Oral)   Resp 18   Ht 5\' 2"  (1.575 m)   Wt 58.1 kg (128 lb)   SpO2 98%   BMI 23.41 kg/m   Physical Exam  Constitutional: She is oriented to person, place, and time. She appears well-developed and well-nourished. No distress.  Non toxic  HENT:  Head: Normocephalic and atraumatic.  Nose: Nose normal.  Mouth/Throat: Mucous membranes are normal. Posterior oropharyngeal erythema present. Tonsils are 1+ on the right. Tonsils are 1+ on the left. Tonsillar exudate.  Mild erythema to oropharynx and tonsils with exudates bilaterally. Uvula midline. No trismus. No SL edema or tenderness. Moist mucous membranes   Eyes: Pupils are equal, round, and reactive to light. Conjunctivae and EOM are normal.  Neck: Normal range of motion.  Mild tender submandibular LAD, no asymmetry   Cardiovascular: Normal rate, regular rhythm and intact distal pulses.  2+ DP and radial pulses bilaterally. No LE edema.   Pulmonary/Chest: Effort normal and breath sounds normal.  Abdominal: Soft. Bowel sounds are normal. There is tenderness.  Mild suprapubic tenderness. +L CVAT. No G/R/R. Negative Murphy's and McBurney's.   Genitourinary: Pelvic exam was performed with patient supine. Cervix exhibits motion tenderness, discharge and friability. Left adnexum displays tenderness. Vaginal discharge found.  Genitourinary Comments:  External genitalia normal without erythema, edema, tenderness, discharge or lesions.  No groin lymphadenopathy.  +Moderate amount of yellow, runny discharge at intraoitus, vaginal vault and around cervix.  Cervix is friable.   +CMT +Left adnexal tenderness without fullness No right adnexal tenderness.   Musculoskeletal: Normal range of motion.  Lymphadenopathy:    She has cervical adenopathy.  Neurological: She is alert and oriented to  person, place, and time.  Skin: Skin is warm and dry. Capillary refill takes less than 2 seconds.  Psychiatric: She has a normal mood and affect. Her behavior is normal.  Nursing note and vitals reviewed.    ED Treatments / Results  Labs (all labs ordered are listed, but only abnormal results are displayed) Labs Reviewed  WET PREP, GENITAL - Abnormal; Notable for the following components:      Result Value   Clue Cells Wet Prep HPF POC PRESENT (*)    WBC, Wet Prep HPF POC MANY (*)    All other components within normal limits  COMPREHENSIVE METABOLIC PANEL - Abnormal; Notable for the following components:   AST 13 (*)    ALT 13 (*)    All other components within normal limits  URINALYSIS, ROUTINE W REFLEX MICROSCOPIC - Abnormal; Notable for the following components:   APPearance HAZY (*)    Hgb urine dipstick SMALL (*)    Leukocytes, UA LARGE (*)    WBC, UA >50 (*)    Bacteria, UA RARE (*)    All other components within normal limits  GROUP A STREP BY PCR  LIPASE, BLOOD  CBC WITH DIFFERENTIAL/PLATELET  GC/CHLAMYDIA PROBE AMP ([place]) NOT AT [facility]    EKG None  Radiology Ct Abdomen Pelvis W Contrast  Result Date: 07/31/2017 CLINICAL DATA:  Acute onset of generalized abdominal pain and dysuria. Vaginal bleeding and pelvic cramping. EXAM: CT ABDOMEN AND PELVIS WITH CONTRAST TECHNIQUE: Multidetector CT imaging of the abdomen and pelvis was performed using the standard protocol following bolus administration of intravenous contrast. CONTRAST:  131mL ISOVUE-300 IOPAMIDOL (ISOVUE-300) INJECTION 61% COMPARISON:  CT of the abdomen and pelvis performed 09/08/2015 FINDINGS: Lower chest: The visualized lung bases are grossly clear. The visualized portions of the mediastinum are unremarkable. Hepatobiliary: The liver is unremarkable in appearance. The gallbladder is unremarkable in appearance. The common bile duct remains normal in caliber. Pancreas: The pancreas is within normal limits.  Spleen: The spleen is unremarkable in appearance. Adrenals/Urinary Tract: The adrenal glands are unremarkable in appearance. The kidneys are within normal limits. There is no evidence of hydronephrosis. No renal or ureteral stones are identified. No perinephric stranding is seen. Stomach/Bowel: The stomach is unremarkable in appearance. The small bowel is within normal limits. The appendix is normal in caliber, without evidence of appendicitis. Scattered diverticulosis is noted along the descending and sigmoid colon. Mild wall thickening at the proximal sigmoid colon could reflect a mild infectious or inflammatory process. Vascular/Lymphatic: Minimal calcification is noted along the abdominal aorta and its branches. No retroperitoneal or pelvic sidewall lymphadenopathy is seen. Reproductive: The bladder is decompressed and not well assessed. Several uterine fibroids are suggested. No suspicious adnexal masses are seen. The ovaries are relatively symmetric. Other: No additional soft tissue abnormalities are seen. Musculoskeletal: No acute osseous abnormalities are identified. Endplate sclerotic change is noted at T11-T12. The visualized musculature is unremarkable in appearance. IMPRESSION: 1. Mild wall thickening at the proximal sigmoid colon could reflect a mild infectious or inflammatory process. 2. Scattered diverticulosis along the descending and sigmoid colon. 3. Several uterine fibroids suggested. Electronically Signed   By: Garald Balding M.D.   On: 07/31/2017 23:51    Procedures Procedures (including critical care time)  Medications Ordered in ED Medications  iopamidol (ISOVUE-300) 61 % injection (has no administration in time range)  sterile water (preservative free) injection (has no administration in time range)  morphine 4 MG/ML injection 4 mg (4 mg Intravenous Given 07/31/17 2227)  ondansetron (ZOFRAN) injection 4 mg (4 mg Intravenous Given 07/31/17 2228)  iopamidol (ISOVUE-300) 61 % injection  100 mL (100 mLs Intravenous Contrast Given 07/31/17 2319)  cefTRIAXone (ROCEPHIN) injection 250 mg (250 mg Intramuscular Given 08/01/17 0020)  azithromycin (ZITHROMAX) tablet 1,000 mg (1,000 mg Oral Given 08/01/17 0019)     Initial Impression / Assessment and Plan / ED Course  I have reviewed the triage vital signs and the nursing notes.  Pertinent labs & imaging results that were available during my care of the patient were reviewed by me and considered in my medical decision making (see chart  for details).  Clinical Course as of Aug 01 20  Thu Jul 31, 2017  2203 Hgb urine dipstick(!): SMALL [CG]  2203 Leukocytes, UA(!): LARGE [CG]  2203 WBC, UA(!): >50 [CG]  2203 Bacteria, UA(!): RARE [CG]  2359 IMPRESSION: 1. Mild wall thickening at the proximal sigmoid colon could reflect a mild infectious or inflammatory process. 2. Scattered diverticulosis along the descending and sigmoid colon. 3. Several uterine fibroids suggested.    CT ABDOMEN PELVIS W CONTRAST [CG]    Clinical Course User Index [CG] Kinnie Feil, PA-C   62 year old female with history of diverticulitis here with abdominal pain.  She endorses n/v/d, dysuria, increased vaginal discharge as well. On exam she has LLQ/suprapubic tenderness and left CVA tenderness. She has CMT and L adnexal tenderness with moderate yellow vaginal discharge noted on exam. High risk sexual behavior without condom use.  Considering diverticulitis, enterocolitis.  Given urinary and vaginal symptoms also considering GU/GYN etiology such as PID, UTI, pyelonephritis.  Lab work reviewed and unremarkable, no leukocytosis.  Electrolytes, lipase, LFTs, creatinine WNL.  Urinalysis suggestive of infection with large leukocytes, greater than 50 WBCs.  She has dysuria, suprapubic tenderness and left CVA tenderness.  CT AP shows mild wall thickening at proximal sigmoid suggestive of infectious/inflammatory process.    Final Clinical Impressions(s) / ED  Diagnoses   Pt with abdominal pain, n/v/d, urinary and vaginal symptoms as well.  Given exam, labs, imaging, will treat for diverticulitis with zofran, tramadol, cipro/flagyl which will cover UTI as well.  She was empirically treated with rocephin/azithro in ER pending GC/chlamydia.   Final diagnoses:  Diverticulitis  Acute cystitis with hematuria  Vaginal discharge    ED Discharge Orders        Ordered    metroNIDAZOLE (FLAGYL) 500 MG tablet  3 times daily     08/01/17 0022    ciprofloxacin (CIPRO) 500 MG tablet  2 times daily     08/01/17 0022    traMADol (ULTRAM) 50 MG tablet  Every 6 hours PRN     08/01/17 0022    ondansetron (ZOFRAN ODT) 4 MG disintegrating tablet  Every 8 hours PRN     08/01/17 0022       Kinnie Feil, PA-C 08/01/17 Annita Brod, MD 08/04/17 [phone]

## 2017-07-31 NOTE — ED Notes (Signed)
Pt informed that PA stated she would be in within 5 mins or so and would put orders in after she was seen.

## 2017-07-31 NOTE — ED Triage Notes (Signed)
Patient is here today for vaginal bleeding. Patient also states she has abdominal pain and has a history of diverticulosis. The pain is on her left side. Patient also has fever and chills. Last PAP was 2 years ago.

## 2017-08-01 LAB — GC/CHLAMYDIA PROBE AMP (~~LOC~~) NOT AT ARMC
CHLAMYDIA, DNA PROBE: NEGATIVE
Neisseria Gonorrhea: POSITIVE — AB

## 2017-08-01 MED ORDER — ONDANSETRON 4 MG PO TBDP: 4.0000 mg | ORAL_TABLET | Freq: Three times a day (TID) | ORAL | 0 refills | Status: DC | PRN

## 2017-08-01 MED ORDER — TRAMADOL HCL 50 MG PO TABS: 50.0000 mg | ORAL_TABLET | Freq: Four times a day (QID) | ORAL | 0 refills | Status: DC | PRN

## 2017-08-01 MED ORDER — TRAMADOL HCL 50 MG PO TABS: 50.0000 mg | ORAL_TABLET | Freq: Four times a day (QID) | ORAL | 0 refills | Status: AC | PRN

## 2017-08-01 MED ORDER — METRONIDAZOLE 500 MG PO TABS: 500.0000 mg | ORAL_TABLET | Freq: Three times a day (TID) | ORAL | 0 refills | Status: DC

## 2017-08-01 MED ORDER — METRONIDAZOLE 500 MG PO TABS: 500.0000 mg | ORAL_TABLET | Freq: Three times a day (TID) | ORAL | 0 refills | Status: AC

## 2017-08-01 MED ORDER — STERILE WATER FOR INJECTION IJ SOLN
INTRAMUSCULAR | Status: AC
  Filled 2017-08-01: qty 10

## 2017-08-01 MED ORDER — CIPROFLOXACIN HCL 500 MG PO TABS: 500.0000 mg | ORAL_TABLET | Freq: Two times a day (BID) | ORAL | 0 refills | Status: DC

## 2017-08-01 MED ORDER — CEFTRIAXONE SODIUM 250 MG IJ SOLR
250.0000 mg | Freq: Once | INTRAMUSCULAR | Status: AC
  Administered 2017-08-01: 250 mg via INTRAMUSCULAR
  Filled 2017-08-01: qty 250

## 2017-08-01 MED ORDER — CIPROFLOXACIN HCL 500 MG PO TABS: 500.0000 mg | ORAL_TABLET | Freq: Two times a day (BID) | ORAL | 0 refills | Status: AC

## 2017-08-01 MED ORDER — AZITHROMYCIN 250 MG PO TABS
1000.0000 mg | ORAL_TABLET | Freq: Once | ORAL | Status: AC
  Administered 2017-08-01: 1000 mg via ORAL
  Filled 2017-08-01: qty 4

## 2017-08-01 NOTE — Discharge Instructions (Addendum)
Lab work, urine and CT are most suggestive of mild diverticulitis and urinary tract infection.   Take ciprofloxacin and metronidazole, these antibiotics will cover diverticulitis and urinary tract infection.   For pain, take 1000 mg acetaminophen (tylenol) every 6 hours plus tramadol 50 mg every 6 hours.  Zofran for nausea. Stay hydrated. Tramadol can cause drowsiness and constipation, increase fiber intake. Do not take with alcohol or other sedating medications.  Return to ER for worsening abdominal pain, fevers, blood in vomit or stools. Continue liquid clear diet until pain improves.   Follow up with primary care doctor in 48 hours for re-peat abdominal exam and to ensure symptoms are improving.  There was some blood noted on pelvic exam today.  Discharge and blood may have been from pelvic infection or STD. Your gonorrhea and chlamydia testing are still pending, you will be called only if results are positive. You were treated for these infections today. Follow up with OBGYN for re-evaluation for this if symptoms do not improve. Notify your partners of symptoms as they should be evaluated and tested.

## 2017-09-17 ENCOUNTER — Encounter (HOSPITAL_BASED_OUTPATIENT_CLINIC_OR_DEPARTMENT_OTHER): Payer: Self-pay

## 2017-09-17 ENCOUNTER — Other Ambulatory Visit: Payer: Self-pay

## 2017-09-17 DIAGNOSIS — R0789 Other chest pain: Secondary | ICD-10-CM

## 2017-09-17 HISTORY — DX: Other chest pain: R07.89

## 2017-09-17 NOTE — Progress Notes (Signed)
Spoke with:  Vaughan Basta NPO:  After Midnight, no gum, candy, or mints  No smoking Arrival time: 0530AM Labs:  (CBC 09/19/2017 in epic) AM medications: None Pre op orders: Yes Ride home:  Jonelle Sidle (daughter) unsure of her new phone number at this time

## 2017-09-17 NOTE — Pre-Procedure Instructions (Addendum)
Dr. Royce Macadamia made aware that Ms. Craighead is complaining of chest discomfort "cramp" and feeling like a weight is on her chest.  She also has a history of high blood pressure but is not taking any medications for it.  Ms. Haggard has an appointment with Dr. Quay Burow, on 09/23/2017.  Dr. Royce Macadamia wants Ms. Santoli to have an EKG on Friday.  He requested the EKG be compared to the EKG from 2015 by Dr. Kalman Shan and also that Ms. Fassler have an anesthesia consult with Dr. Kalman Shan on Friday to determine if she needs further cardiac work up prior to her procedure 09/22/2017.

## 2017-09-18 NOTE — H&P (Signed)
Patient Charlotte Garcia, Charlotte Garcia DICTATION#  [id number] CSN# [id number]  Darlyn Chamber, MD 09/18/2017 9:59 AM

## 2017-09-18 NOTE — H&P (Signed)
NAME: Charlotte Garcia, Charlotte Garcia MEDICAL RECORD [id number] ACCOUNT [id number] DATE OF BIRTH:10/01/1955 FACILITY: [facility] LOCATION: [facility] PHYSICIAN:Breven Guidroz Sherran Needs, MD  HISTORY AND PHYSICAL  DATE OF ADMISSION:  09/18/2017  DATE OF SURGERY:  07/08   HISTORY OF PRESENT ILLNESS:  The patient is a 62 year old postmenopausal patient who had an episode of postmenopausal bleeding.  Came into the office for saline infusion ultrasound.  She had evidence of an endometrial wall polyp on the anterior wall.   She now presents for hysteroscopic evaluation and resection of said polyp.  ALLERGIES:  SHE IS ALLERGIC TO CODEINE AND MORPHINE.  MEDICATIONS:  At the present time include metronidazole, Zofran and Ultram.  PAST MEDICAL HISTORY:  She has a history of bronchitis.  She has a history of diverticulitis.  Hypertension.  Hypokalemia.  History of a heart murmur.  Tobacco use.  PAST SURGICAL HISTORY:  She has had a cystectomy from the forehead, dilation and curettage of the uterus and wrist surgery for a ganglion cyst.  FAMILY HISTORY:  Noncontributory.  SOCIAL HISTORY:  Does reveal tobacco, but no alcohol use.  REVIEW OF SYSTEMS:  Noncontributory.  PHYSICAL EXAMINATION: VITAL SIGNS:  Afebrile, stable vital signs. HEENT:  The patient is normocephalic.  Pupils equal, round, reactive to light and accommodation.  Extraocular movements are intact.  Sclerae and conjunctivae are clear.  Oropharynx is clear. NECK:  Without thyromegaly. LUNGS:  Clear. HEART:  Regular rhythm and rate without murmurs or gallops.  No carotid or abdominal  bruits.   ABDOMEN:  Her abdominal exam is benign.  No masses, organomegaly or tenderness. PELVIC:  Normal external genitalia.  Vaginal mucosa is clear.  Cervix unremarkable.  Uterus normal size, shape and contour.  Adnexa free of masses or tenderness. EXTREMITIES:  Trace edema. NEUROLOGIC:  Grossly normal limits.  IMPRESSION:  Postmenopausal bleeding with endometrial  polyp.  PLAN:  The patient will undergo hysteroscopy with resection of polyp.  Complications were discussed including the risk of infection.  The risk of hemorrhage could require transfusion with the risks of AIDS or hepatitis.  Excessive bleeding could require  hysterectomy.  Risk of perforation leading to injury to adjacent organs such as bowel that could require further exploratory surgery.  Risk of deep venous thrombosis and pulmonary embolus.  The patient expressed understanding of potential risks and  complications and alternatives.  TN/NUANCE  D:09/18/2017 T:09/18/2017 JOB:[id number]

## 2017-09-19 ENCOUNTER — Encounter (HOSPITAL_COMMUNITY)
Admission: RE | Admit: 2017-09-19 | Discharge: 2017-09-19 | Disposition: A | Discharge status: Home / Self Care | Payer: Managed Care, Other (non HMO) | Source: Ambulatory Visit | Attending: Obstetrics and Gynecology | Admitting: Obstetrics and Gynecology

## 2017-09-19 DIAGNOSIS — Z01810 Encounter for preprocedural cardiovascular examination: Secondary | ICD-10-CM | POA: Diagnosis not present

## 2017-09-19 DIAGNOSIS — Z01812 Encounter for preprocedural laboratory examination: Secondary | ICD-10-CM | POA: Insufficient documentation

## 2017-09-19 LAB — CBC
HCT: 40.6 % (ref 36.0–46.0)
Hemoglobin: 13.4 g/dL (ref 12.0–15.0)
MCH: 30.7 pg (ref 26.0–34.0)
MCHC: 33 g/dL (ref 30.0–36.0)
MCV: 93.1 fL (ref 78.0–100.0)
PLATELETS: 394 10*3/uL (ref 150–400)
RBC: 4.36 MIL/uL (ref 3.87–5.11)
RDW: 14.2 % (ref 11.5–15.5)
WBC: 6.9 10*3/uL (ref 4.0–10.5)

## 2017-09-19 NOTE — Progress Notes (Addendum)
PT CAME IN TODAY FOR LAB WORK , EKG AND ANESTHESIA CONSULT.  CALLED AND SPOKE W/ DR R. FITZGERALD MDA TO COME FOR CONSULT.  INFORMED HIM OF REASON FOR CONSULT (SEE PREVIOUS FROM [facility]) PER DR FOSTER.  DR ROSE MDA NOT AVAILABLE GONE FOR THE DAY.  PER DR R. FITZGERALD PT NEEDS POSTPONE SURGERY UNTIL AFTER CARDIOLOGIST VISIT ON Tuesday 09-23-2017 WITH [facility]. CALLED AND SPOKE W/ DR [facility] VIA HIS CELL PHONE. LET HIM KNOW OF DR R. FITZGERALD RECOMMEDATION TO POSTPONE SURGERY UNTIL AFTER CARDIOLOGIST VISIT WITH [facility].  PER DR MCCOMB UNDERSTOOD AND ASKED SINCE HIS OFFICE IS CLOSED TO CALL CENTRALIZED SCHEDULING AND HE WILL LET HIS OR SCHEDULER KNOW ON Monday.  SUGGESTED TO PUT CASE IN DEPOT AND OR SCHEDULER AND RESCHEDULE WITHOUT REENTERING INFORMATION.  DR [facility] STATED OK.  SPOKE W/ PT FACE TO FACE AND LET HER KNOW SURGERY IS POSTPONED UNTIL SHE HAS CARDIOLOGY VISIT WITH [facility] AND THE OFFICE WILL CALL HER TO RESCHEDULE.

## 2017-09-23 ENCOUNTER — Encounter: Payer: Self-pay | Admitting: Cardiovascular Disease

## 2017-09-23 ENCOUNTER — Ambulatory Visit (INDEPENDENT_AMBULATORY_CARE_PROVIDER_SITE_OTHER): Payer: Managed Care, Other (non HMO) | Admitting: Cardiovascular Disease

## 2017-09-23 VITALS — BP 147/93 | HR 90 | Ht 62.0 in | Wt 117.4 lb

## 2017-09-23 DIAGNOSIS — I10 Essential (primary) hypertension: Secondary | ICD-10-CM | POA: Diagnosis not present

## 2017-09-23 DIAGNOSIS — R072 Precordial pain: Secondary | ICD-10-CM | POA: Diagnosis not present

## 2017-09-23 DIAGNOSIS — E7800 Pure hypercholesterolemia, unspecified: Secondary | ICD-10-CM | POA: Diagnosis not present

## 2017-09-23 MED ORDER — LISINOPRIL 5 MG PO TABS: 5.0000 mg | ORAL_TABLET | Freq: Every day | ORAL | 3 refills | Status: DC

## 2017-09-23 NOTE — Patient Instructions (Signed)
Medication Instructions:   START LISINOPRIL 5 MG ONCE DAILY  Labwork:  Your physician recommends that you return for lab work in: [place]  Testing/Procedures:  Your physician has requested that you have en exercise stress myoview. For further information please visit [email or web address]. Please follow instruction sheet, as given.    Follow-Up:  Your physician recommends that you schedule a follow-up appointment in: [place] [facility]   Your physician recommends that you schedule a follow-up appointment in: [place]

## 2017-09-23 NOTE — Progress Notes (Signed)
09/23/2017 Charlotte Garcia   1955/09/24  [id number]  Primary Physician Patient, No Pcp Per Primary Cardiologist: Lorretta Harp MD Lupe Carney, [place]  HPI:  Charlotte Garcia is a 62 y.o. mildly overweight divorced Caucasian female mother of 46, grandmother to 2 grandchildren referred by Dr. Radene Knee for cardiovascular evaluation and preoperative clearance before hysterectomy.  Risk factors include continued tobacco abuse of 1 pack/week, untreated hypertension and family history with father who died of myocardial infarctions at age 70 and a brother who has had stents.  He is never had a heart attack or stroke.  She does complain of dyspnea on exertion.  She is had chest pain that began 1 month ago and said it episodes since with some left upper extremity radiation.  She also has diverticulosis.  Apparently she needs elective hysterectomy in the near future.   Current Meds  Medication Sig  . aspirin EC 81 MG tablet Take 81 mg by mouth daily as needed for moderate pain.  . ciprofloxacin (CIPRO) 500 MG tablet   . diphenhydramine-acetaminophen (TYLENOL PM) 25-500 MG TABS tablet Take 2 tablets by mouth at bedtime as needed (pain).  . metroNIDAZOLE (FLAGYL) 500 MG tablet   . ondansetron (ZOFRAN ODT) 4 MG disintegrating tablet Take 1 tablet (4 mg total) by mouth every 8 (eight) hours as needed for nausea or vomiting.  . traMADol (ULTRAM) 50 MG tablet      Allergies  Allergen Reactions  . Sulfa Antibiotics Hives  . Codeine Nausea And Vomiting  . Morphine And Related Nausea Only    Social History   Socioeconomic History  . Marital status: Divorced    Spouse name: Not on file  . Number of children: Not on file  . Years of education: Not on file  . Highest education level: Not on file  Occupational History  . Not on file  Social Needs  . Financial resource strain: Not on file  . Food insecurity:    Worry: Not on file    Inability: Not on file  . Transportation needs:   Medical: Not on file    Non-medical: Not on file  Tobacco Use  . Smoking status: Current Some Day Smoker    Packs/day: 0.25    Types: Cigarettes  . Smokeless tobacco: Never Used  . Tobacco comment: 20 plus years  Substance and Sexual Activity  . Alcohol use: Yes    Comment: 1-2 glasses of wine 3 times a week  . Drug use: Yes    Types: Marijuana    Comment: 3 weeks ago as of 09/17/2017  . Sexual activity: Yes    Birth control/protection: Post-menopausal  Lifestyle  . Physical activity:    Days per week: Not on file    Minutes per session: Not on file  . Stress: Not on file  Relationships  . Social connections:    Talks on phone: Not on file    Gets together: Not on file    Attends religious service: Not on file    Active member of club or organization: Not on file    Attends meetings of clubs or organizations: Not on file    Relationship status: Not on file  . Intimate partner violence:    Fear of current or ex partner: Not on file    Emotionally abused: Not on file    Physically abused: Not on file    Forced sexual activity: Not on file  Other Topics Concern  .  Not on file  Social History Narrative  . Not on file     Review of Systems: General: negative for chills, fever, night sweats or weight changes.  Cardiovascular: negative for chest pain, dyspnea on exertion, edema, orthopnea, palpitations, paroxysmal nocturnal dyspnea or shortness of breath Dermatological: negative for rash Respiratory: negative for cough or wheezing Urologic: negative for hematuria Abdominal: negative for nausea, vomiting, diarrhea, bright red blood per rectum, melena, or hematemesis Neurologic: negative for visual changes, syncope, or dizziness All other systems reviewed and are otherwise negative except as noted above.    Blood pressure (!) 147/93, pulse 90, height 5\' 2"  (1.575 m), weight 117 lb 6.4 oz (53.3 kg).  General appearance: alert and no distress Neck: no adenopathy, no carotid  bruit, no JVD, supple, symmetrical, trachea midline and thyroid not enlarged, symmetric, no tenderness/mass/nodules Lungs: clear to auscultation bilaterally Heart: regular rate and rhythm, S1, S2 normal, no murmur, click, rub or gallop Extremities: extremities normal, atraumatic, no cyanosis or edema Pulses: 2+ and symmetric Skin: Skin color, texture, turgor normal. No rashes or lesions Neurologic: Alert and oriented X 3, normal strength and tone. Normal symmetric reflexes. Normal coordination and gait  EKG not performed today  ASSESSMENT AND PLAN:   Hypertension History of essential hypertension not on antihypertensive medications currently with blood pressure running 147/93.  I am going to start her on lisinopril 5 mg a day and will check a basic metabolic panel in 2 weeks.  Tobacco abuse Continued tobacco use of 1 pack/week for the last 40 years.  Atypical chest pain Ms. Sizemore has had atypical chest pain for the last month to 10 episodes.  These last approximate 50 minutes of time with occasional left upper extremity radiation.  Risk factors include tobacco abuse, hypertension and family history with a father who died of a myocardial infarction at age 34 and a brother who has had stents.  She does have a mass on her uterus and apparently needs a hysterectomy as well.  I am going to get a exercise Myoview stress test to risk stratify her.      Lorretta Harp MD FACP,FACC,FAHA, [facility] 09/23/2017 3:02 PM

## 2017-09-23 NOTE — Assessment & Plan Note (Signed)
Charlotte Garcia has had atypical chest pain for the last month to 10 episodes.  These last approximate 50 minutes of time with occasional left upper extremity radiation.  Risk factors include tobacco abuse, hypertension and family history with a father who died of a myocardial infarction at age 63 and a brother who has had stents.  She does have a mass on her uterus and apparently needs a hysterectomy as well.  I am going to get a exercise Myoview stress test to risk stratify her.

## 2017-09-23 NOTE — Assessment & Plan Note (Signed)
History of essential hypertension not on antihypertensive medications currently with blood pressure running 147/93.  I am going to start her on lisinopril 5 mg a day and will check a basic metabolic panel in 2 weeks.

## 2017-09-23 NOTE — Assessment & Plan Note (Signed)
Continued tobacco use of 1 pack/week for the last 40 years.

## 2017-09-24 ENCOUNTER — Telehealth: Payer: Self-pay | Admitting: Cardiovascular Disease

## 2017-09-24 ENCOUNTER — Telehealth (HOSPITAL_COMMUNITY): Payer: Self-pay

## 2017-09-24 NOTE — Telephone Encounter (Signed)
Encounter complete. 

## 2017-09-24 NOTE — Telephone Encounter (Signed)
New Message:          [facility] Pre-operative Risk Assessment    Request for surgical clearance:  1. What type of surgery is being performed? Hysteroscopy dc  2. When is this surgery scheduled? 10/06/17  3. What type of clearance is required (medical clearance vs. Pharmacy clearance to hold med vs. Both)? Medical  4. Are there any medications that need to be held prior to surgery and how long?NO   5. Practice name and name of physician performing surgery? Dr. Arvella Nigh  6. What is your office phone number [phone]    7.   What is your office fax number [phone]  8.   Anesthesia type (None, local, MAC, general) ? Local/IV   Charlotte Garcia 09/24/2017, 9:27 AM  _________________________________________________________________   (provider comments below)

## 2017-09-25 ENCOUNTER — Telehealth (HOSPITAL_COMMUNITY): Payer: Self-pay

## 2017-09-26 ENCOUNTER — Ambulatory Visit (HOSPITAL_COMMUNITY)
Admission: RE | Admit: 2017-09-26 | Discharge: 2017-09-26 | Disposition: A | Discharge status: Home / Self Care | Payer: Managed Care, Other (non HMO) | Source: Ambulatory Visit | Attending: Cardiology | Admitting: Cardiology

## 2017-09-26 DIAGNOSIS — R072 Precordial pain: Secondary | ICD-10-CM | POA: Insufficient documentation

## 2017-09-26 LAB — MYOCARDIAL PERFUSION IMAGING
CHL CUP NUCLEAR SDS: 2
CHL CUP NUCLEAR SRS: 1
LV dias vol: 98 mL (ref 46–106)
LVSYSVOL: 43 mL
Peak HR: 118 {beats}/min
Rest HR: 64 {beats}/min
SSS: 3
TID: 0.96

## 2017-09-26 MED ORDER — TECHNETIUM TC 99M TETROFOSMIN IV KIT
10.1000 | PACK | Freq: Once | INTRAVENOUS | Status: AC | PRN
  Administered 2017-09-26: 10.1 via INTRAVENOUS
  Filled 2017-09-26: qty 11

## 2017-09-26 MED ORDER — TECHNETIUM TC 99M TETROFOSMIN IV KIT
29.0000 | PACK | Freq: Once | INTRAVENOUS | Status: AC | PRN
  Administered 2017-09-26: 29 via INTRAVENOUS
  Filled 2017-09-26: qty 29

## 2017-09-26 MED ORDER — REGADENOSON 0.4 MG/5ML IV SOLN
0.4000 mg | Freq: Once | INTRAVENOUS | Status: AC
  Administered 2017-09-26: 0.4 mg via INTRAVENOUS

## 2017-09-26 NOTE — Telephone Encounter (Signed)
Encounter complete. 

## 2017-09-29 NOTE — Telephone Encounter (Signed)
   Primary Cardiologist: Dr. Gwenlyn Found Chart reviewed as part of pre-operative protocol coverage. Given recent visit and low risk stress testing on 09/29/2017 and based on the ACC/AHA guidelines, Charlotte Garcia would be at acceptable risk for the planned procedure without any further cardiovascular testing.   I will route this recommendation to the requesting party via Epic fax function and remove from pre-op pool.  Please call with questions.  Truitt Merle, NP 09/29/2017, 3:10 PM

## 2017-09-30 ENCOUNTER — Telehealth: Payer: Self-pay | Admitting: Cardiology

## 2017-09-30 ENCOUNTER — Telehealth: Payer: Self-pay | Admitting: Cardiovascular Disease

## 2017-09-30 NOTE — Telephone Encounter (Signed)
Left message to verify fax was received.  Advised to return call if needed

## 2017-09-30 NOTE — Telephone Encounter (Signed)
New Message   Jinny Sanders at [facility] is calling to check on the pre op clearance that was sent. She says they recvd a fax with the request for clearance with the question 1-8 but nothing stating it was clear. Please call

## 2017-09-30 NOTE — Telephone Encounter (Signed)
Opened in error

## 2017-09-30 NOTE — Telephone Encounter (Signed)
Error

## 2017-10-02 ENCOUNTER — Encounter (HOSPITAL_BASED_OUTPATIENT_CLINIC_OR_DEPARTMENT_OTHER): Payer: Self-pay | Admitting: *Deleted

## 2017-10-02 ENCOUNTER — Other Ambulatory Visit: Payer: Self-pay

## 2017-10-02 NOTE — Progress Notes (Signed)
Spoke w/ pt via phone for pre-op interview.  Npo after mn.  Arrive at 0800.  Needs istat.  Current cbc and ekg in chart and epic.  Cardiac clearance dated 09-29-2017 in chart and epic.

## 2017-10-06 ENCOUNTER — Ambulatory Visit (HOSPITAL_BASED_OUTPATIENT_CLINIC_OR_DEPARTMENT_OTHER): Payer: Managed Care, Other (non HMO) | Admitting: Anesthesiology

## 2017-10-06 ENCOUNTER — Other Ambulatory Visit: Payer: Self-pay

## 2017-10-06 ENCOUNTER — Encounter (HOSPITAL_BASED_OUTPATIENT_CLINIC_OR_DEPARTMENT_OTHER): Payer: Self-pay | Admitting: *Deleted

## 2017-10-06 ENCOUNTER — Ambulatory Visit (HOSPITAL_BASED_OUTPATIENT_CLINIC_OR_DEPARTMENT_OTHER)
Admission: RE | Admit: 2017-10-06 | Discharge: 2017-10-06 | Disposition: A | Discharge status: Home / Self Care | Payer: Managed Care, Other (non HMO) | Source: Ambulatory Visit | Attending: Obstetrics and Gynecology | Admitting: Obstetrics and Gynecology

## 2017-10-06 ENCOUNTER — Encounter (HOSPITAL_BASED_OUTPATIENT_CLINIC_OR_DEPARTMENT_OTHER)
Admission: RE | Disposition: A | Discharge status: Home / Self Care | Payer: Self-pay | Source: Ambulatory Visit | Attending: Obstetrics and Gynecology

## 2017-10-06 DIAGNOSIS — Z8719 Personal history of other diseases of the digestive system: Secondary | ICD-10-CM | POA: Insufficient documentation

## 2017-10-06 DIAGNOSIS — Z79899 Other long term (current) drug therapy: Secondary | ICD-10-CM | POA: Diagnosis not present

## 2017-10-06 DIAGNOSIS — N840 Polyp of corpus uteri: Secondary | ICD-10-CM | POA: Diagnosis not present

## 2017-10-06 DIAGNOSIS — Z885 Allergy status to narcotic agent status: Secondary | ICD-10-CM | POA: Diagnosis not present

## 2017-10-06 DIAGNOSIS — Z7982 Long term (current) use of aspirin: Secondary | ICD-10-CM | POA: Diagnosis not present

## 2017-10-06 DIAGNOSIS — N950 Postmenopausal bleeding: Secondary | ICD-10-CM | POA: Diagnosis not present

## 2017-10-06 DIAGNOSIS — I10 Essential (primary) hypertension: Secondary | ICD-10-CM | POA: Diagnosis not present

## 2017-10-06 HISTORY — DX: Unspecified osteoarthritis, unspecified site: M19.90

## 2017-10-06 HISTORY — DX: Diverticulosis of intestine, part unspecified, without perforation or abscess without bleeding: K57.90

## 2017-10-06 HISTORY — DX: Headache: R51

## 2017-10-06 HISTORY — DX: Other chest pain: R07.89

## 2017-10-06 HISTORY — DX: Gastro-esophageal reflux disease without esophagitis: K21.9

## 2017-10-06 HISTORY — DX: Headache, unspecified: R51.9

## 2017-10-06 HISTORY — DX: Leiomyoma of uterus, unspecified: D25.9

## 2017-10-06 HISTORY — DX: Personal history of other diseases of the female genital tract: Z87.42

## 2017-10-06 HISTORY — DX: Personal history of other diseases of the digestive system: Z87.19

## 2017-10-06 HISTORY — DX: Polyp of corpus uteri: N84.0

## 2017-10-06 HISTORY — DX: Anemia, unspecified: D64.9

## 2017-10-06 HISTORY — DX: Contact with and (suspected) exposure to tuberculosis: Z20.1

## 2017-10-06 HISTORY — PX: DILATATION & CURETTAGE/HYSTEROSCOPY WITH MYOSURE: SHX6511

## 2017-10-06 LAB — POCT I-STAT 4, (NA,K, GLUC, HGB,HCT)
Glucose, Bld: 91 mg/dL (ref 70–99)
HCT: 42 % (ref 36.0–46.0)
Hemoglobin: 14.3 g/dL (ref 12.0–15.0)
Potassium: 3.7 mmol/L (ref 3.5–5.1)
Sodium: 140 mmol/L (ref 135–145)

## 2017-10-06 SURGERY — DILATATION & CURETTAGE/HYSTEROSCOPY WITH MYOSURE
Anesthesia: General | Site: Vagina

## 2017-10-06 MED ORDER — IBUPROFEN 100 MG/5ML PO SUSP
200.0000 mg | Freq: Four times a day (QID) | ORAL | Status: DC | PRN
  Filled 2017-10-06: qty 30

## 2017-10-06 MED ORDER — KETOROLAC TROMETHAMINE 30 MG/ML IJ SOLN
INTRAMUSCULAR | Status: AC
  Filled 2017-10-06: qty 1

## 2017-10-06 MED ORDER — LIDOCAINE 2% (20 MG/ML) 5 ML SYRINGE
INTRAMUSCULAR | Status: AC
  Filled 2017-10-06: qty 5

## 2017-10-06 MED ORDER — NALOXONE HCL 0.4 MG/ML IJ SOLN
INTRAMUSCULAR | Status: DC | PRN
  Administered 2017-10-06: 200 ug via INTRAVENOUS

## 2017-10-06 MED ORDER — LACTATED RINGERS IV SOLN
INTRAVENOUS | Status: DC
  Administered 2017-10-06 (×2): via INTRAVENOUS
  Filled 2017-10-06: qty 1000

## 2017-10-06 MED ORDER — PROPOFOL 10 MG/ML IV BOLUS
INTRAVENOUS | Status: AC
  Filled 2017-10-06: qty 20

## 2017-10-06 MED ORDER — LIDOCAINE HCL 1 % IJ SOLN
INTRAMUSCULAR | Status: DC | PRN
  Administered 2017-10-06: 10 mL

## 2017-10-06 MED ORDER — ONDANSETRON HCL 4 MG/2ML IJ SOLN
INTRAMUSCULAR | Status: DC | PRN
  Administered 2017-10-06: 4 mg via INTRAVENOUS

## 2017-10-06 MED ORDER — FENTANYL CITRATE (PF) 100 MCG/2ML IJ SOLN
INTRAMUSCULAR | Status: AC
  Filled 2017-10-06: qty 2

## 2017-10-06 MED ORDER — CEFAZOLIN SODIUM-DEXTROSE 2-4 GM/100ML-% IV SOLN
INTRAVENOUS | Status: AC
  Filled 2017-10-06: qty 100

## 2017-10-06 MED ORDER — KETOROLAC TROMETHAMINE 30 MG/ML IJ SOLN
INTRAMUSCULAR | Status: DC | PRN
  Administered 2017-10-06: 30 mg via INTRAVENOUS

## 2017-10-06 MED ORDER — DEXAMETHASONE SODIUM PHOSPHATE 10 MG/ML IJ SOLN
INTRAMUSCULAR | Status: AC
  Filled 2017-10-06: qty 1

## 2017-10-06 MED ORDER — ONDANSETRON HCL 4 MG/2ML IJ SOLN
4.0000 mg | Freq: Once | INTRAMUSCULAR | Status: DC | PRN
  Filled 2017-10-06: qty 2

## 2017-10-06 MED ORDER — KETOROLAC TROMETHAMINE 30 MG/ML IJ SOLN
30.0000 mg | Freq: Once | INTRAMUSCULAR | Status: DC | PRN
  Filled 2017-10-06: qty 1

## 2017-10-06 MED ORDER — IBUPROFEN 200 MG PO TABS
200.0000 mg | ORAL_TABLET | Freq: Four times a day (QID) | ORAL | Status: DC | PRN
  Filled 2017-10-06: qty 2

## 2017-10-06 MED ORDER — MIDAZOLAM HCL 2 MG/2ML IJ SOLN
INTRAMUSCULAR | Status: AC
  Filled 2017-10-06: qty 2

## 2017-10-06 MED ORDER — FENTANYL CITRATE (PF) 100 MCG/2ML IJ SOLN
25.0000 ug | INTRAMUSCULAR | Status: DC | PRN
  Administered 2017-10-06: 25 ug via INTRAVENOUS
  Filled 2017-10-06: qty 1

## 2017-10-06 MED ORDER — MIDAZOLAM HCL 2 MG/2ML IJ SOLN
INTRAMUSCULAR | Status: DC | PRN
  Administered 2017-10-06: 2 mg via INTRAVENOUS

## 2017-10-06 MED ORDER — DEXAMETHASONE SODIUM PHOSPHATE 10 MG/ML IJ SOLN
INTRAMUSCULAR | Status: DC | PRN
  Administered 2017-10-06: 10 mg via INTRAVENOUS

## 2017-10-06 MED ORDER — PROPOFOL 10 MG/ML IV BOLUS
INTRAVENOUS | Status: DC | PRN
  Administered 2017-10-06: 150 mg via INTRAVENOUS

## 2017-10-06 MED ORDER — OXYCODONE HCL 5 MG PO TABS
ORAL_TABLET | ORAL | Status: AC
  Filled 2017-10-06: qty 1

## 2017-10-06 MED ORDER — OXYCODONE HCL 5 MG PO TABS
5.0000 mg | ORAL_TABLET | Freq: Once | ORAL | Status: AC | PRN
  Administered 2017-10-06: 5 mg via ORAL
  Filled 2017-10-06: qty 1

## 2017-10-06 MED ORDER — ONDANSETRON HCL 4 MG/2ML IJ SOLN
INTRAMUSCULAR | Status: AC
  Filled 2017-10-06: qty 2

## 2017-10-06 MED ORDER — MEPERIDINE HCL 25 MG/ML IJ SOLN
6.2500 mg | INTRAMUSCULAR | Status: DC | PRN
  Filled 2017-10-06: qty 1

## 2017-10-06 MED ORDER — SODIUM CHLORIDE 0.9 % IV SOLN
INTRAVENOUS | Status: AC | PRN
  Administered 2017-10-06: 3000 mL via INTRAMUSCULAR

## 2017-10-06 MED ORDER — LIDOCAINE 2% (20 MG/ML) 5 ML SYRINGE
INTRAMUSCULAR | Status: DC | PRN
  Administered 2017-10-06: 60 mg via INTRAVENOUS

## 2017-10-06 MED ORDER — CEFAZOLIN SODIUM-DEXTROSE 2-4 GM/100ML-% IV SOLN
2.0000 g | INTRAVENOUS | Status: AC
  Administered 2017-10-06: 2 g via INTRAVENOUS
  Filled 2017-10-06: qty 100

## 2017-10-06 MED ORDER — FENTANYL CITRATE (PF) 100 MCG/2ML IJ SOLN
INTRAMUSCULAR | Status: DC | PRN
  Administered 2017-10-06 (×2): 50 ug via INTRAVENOUS

## 2017-10-06 MED ORDER — OXYCODONE HCL 5 MG/5ML PO SOLN
5.0000 mg | Freq: Once | ORAL | Status: AC | PRN
  Filled 2017-10-06: qty 5

## 2017-10-06 MED ORDER — NALOXONE HCL 0.4 MG/ML IJ SOLN
INTRAMUSCULAR | Status: AC
  Filled 2017-10-06: qty 1

## 2017-10-06 SURGICAL SUPPLY — 29 items
CANISTER SUCT 3000ML PPV (MISCELLANEOUS) ×8 IMPLANT
CATH ROBINSON RED A/P 16FR (CATHETERS) ×4 IMPLANT
COUNTER NEEDLE 1200 MAGNETIC (NEEDLE) IMPLANT
DEVICE MYOSURE LITE (MISCELLANEOUS) IMPLANT
DEVICE MYOSURE REACH (MISCELLANEOUS) ×2 IMPLANT
DILATOR CANAL MILEX (MISCELLANEOUS) IMPLANT
FILTER ARTHROSCOPY CONVERTOR (FILTER) ×4 IMPLANT
GLOVE BIO SURGEON STRL SZ7 (GLOVE) ×10 IMPLANT
GLOVE BIOGEL PI IND STRL 6.5 (GLOVE) IMPLANT
GLOVE BIOGEL PI IND STRL 7.0 (GLOVE) IMPLANT
GLOVE BIOGEL PI IND STRL 7.5 (GLOVE) IMPLANT
GLOVE BIOGEL PI INDICATOR 6.5 (GLOVE) ×2
GLOVE BIOGEL PI INDICATOR 7.0 (GLOVE) ×2
GLOVE BIOGEL PI INDICATOR 7.5 (GLOVE) ×2
GOWN STRL REUS W/TWL XL LVL3 (GOWN DISPOSABLE) ×4 IMPLANT
IV NS IRRIG 3000ML ARTHROMATIC (IV SOLUTION) ×4 IMPLANT
KIT TURNOVER CYSTO (KITS) ×4 IMPLANT
MYOSURE XL FIBROID REM (MISCELLANEOUS)
NDL SPNL 18GX3.5 QUINCKE PK (NEEDLE) ×2 IMPLANT
NEEDLE SPNL 18GX3.5 QUINCKE PK (NEEDLE) ×3 IMPLANT
PACK VAGINAL MINOR WOMEN LF (CUSTOM PROCEDURE TRAY) ×4 IMPLANT
PAD OB MATERNITY 4.3X12.25 (PERSONAL CARE ITEMS) ×4 IMPLANT
PAD PREP 24X48 CUFFED NSTRL (MISCELLANEOUS) ×4 IMPLANT
SEAL ROD LENS SCOPE MYOSURE (ABLATOR) ×4 IMPLANT
SYSTEM TISS REMOVAL MYSR XL RM (MISCELLANEOUS) IMPLANT
TOWEL OR 17X24 6PK STRL BLUE (TOWEL DISPOSABLE) ×8 IMPLANT
TUBING AQUILEX INFLOW (TUBING) ×4 IMPLANT
TUBING AQUILEX OUTFLOW (TUBING) ×4 IMPLANT
WATER STERILE IRR 500ML POUR (IV SOLUTION) ×4 IMPLANT

## 2017-10-06 NOTE — Brief Op Note (Signed)
Patient name Charlotte Garcia, [place] DICTATION# [id number] CSN# [id number]  [facility], MD 10/06/2017 10:13 AM

## 2017-10-06 NOTE — H&P (Signed)
  History and physical exam unchanged 

## 2017-10-06 NOTE — Anesthesia Procedure Notes (Signed)
Procedure Name: LMA Insertion Date/Time: 10/06/2017 9:53 AM Performed by: Wanita Chamberlain, CRNA Pre-anesthesia Checklist: Timeout performed, Emergency Drugs available, Suction available, Patient being monitored and Patient identified Patient Re-evaluated:Patient Re-evaluated prior to induction Oxygen Delivery Method: Circle system utilized Preoxygenation: Pre-oxygenation with 100% oxygen Ventilation: Mask ventilation without difficulty LMA: LMA inserted LMA Size: 4.0 Number of attempts: 1 Placement Confirmation: breath sounds checked- equal and bilateral,  CO2 detector and positive ETCO2 Tube secured with: Tape Dental Injury: Teeth and Oropharynx as per pre-operative assessment

## 2017-10-06 NOTE — Anesthesia Postprocedure Evaluation (Signed)
Anesthesia Post Note  Patient: Charlotte Garcia  Procedure(s) Performed: DILATATION & CURETTAGE/HYSTEROSCOPY WITH MYOSURE (N/A Vagina )     Patient location during evaluation: PACU Anesthesia Type: General Level of consciousness: awake Pain management: pain level controlled Vital Signs Assessment: post-procedure vital signs reviewed and stable Respiratory status: spontaneous breathing Cardiovascular status: stable Postop Assessment: no apparent nausea or vomiting Anesthetic complications: no    Last Vitals:  Vitals:   10/06/17 1100 10/06/17 1115  BP: (!) 137/95 (!) 158/94  Pulse: 78 79  Resp: 14 14  Temp:    SpO2: 96% 94%    Last Pain:  Vitals:   10/06/17 1115  TempSrc:   PainSc: 8    Pain Goal: Patients Stated Pain Goal: 6 (10/06/17 0905)               Genine Beckett JR,JOHN Mateo Flow

## 2017-10-06 NOTE — Op Note (Signed)
NAME: Charlotte Garcia, GANIM MEDICAL RECORD ZJ:[id number] ACCOUNT [id number] DATE OF BIRTH:1956/01/03 FACILITY: [facility] LOCATION: [facility] PHYSICIAN:Bird Tailor Sherran Needs, MD  OPERATIVE REPORT  DATE OF PROCEDURE:  10/06/2017  PREOPERATIVE DIAGNOSIS:  Postmenopausal bleeding with endometrial polyp.  POSTOPERATIVE DIAGNOSIS:  Postmenopausal bleeding with endometrial polyp.  PROCEDURE:  Paracervical block, cervical dilation, hysteroscopy with MyoSure resection of endometrial polyp and endometrial curettings.  SURGEON:  Arvella Nigh, MD  ANESTHESIA:  General with paracervical block.  ESTIMATED BLOOD LOSS:  Minimal.  PACKS AND DRAINS:  None.    INTRAOPERATIVE BLOOD PLACEMENT:  None.  COMPLICATIONS:  None.  INDICATIONS:  Dictated in history and physical.    DESCRIPTION OF PROCEDURE:  The patient was taken to the OR and placed in supine position.  After satisfactory level of general anesthesia was obtained, she was placed in the dorsal lithotomy position using Allen stirrups.  Perineum and vagina were  prepped with Betadine and draped in sterile field.  Speculum placed in the vaginal vault.  Cervix grasped with single tooth tenaculum.  Paracervical block 1% Xylocaine was instituted.  Uterus sounded to approximately 8.5 cm.  Cervix was serially dilated.   Hysteroscope was introduced.  Intrauterine cavity was distended using saline.  Visualization revealed a large endometrial polyp.  Using the MyoSure it was completely resected and sent to pathology.  There was no active bleeding, signs of perforation or  complications.  Hysteroscope was then removed.  Endometrial curettings were obtained also sent to pathology.  At this point in time, the single tooth tenaculum speculum removed.  The patient had minimal to no bleeding.  The patient was taken out of  dorsal lithotomy position once alert and extubated and transferred to recovery room in good condition.  TN/NUANCE  D:10/06/2017 T:10/06/2017  JOB:[id number]

## 2017-10-06 NOTE — Transfer of Care (Signed)
Immediate Anesthesia Transfer of Care Note  Patient: Charlotte Garcia  Procedure(s) Performed: DILATATION & CURETTAGE/HYSTEROSCOPY WITH MYOSURE (N/A Vagina )  Patient Location: PACU  Anesthesia Type:General  Level of Consciousness: awake, alert , oriented and patient cooperative  Airway & Oxygen Therapy: Patient Spontanous Breathing and Patient connected to nasal cannula oxygen  Post-op Assessment: Report given to RN and Post -op Vital signs reviewed and stable  Post vital signs: Reviewed and stable  Last Vitals:  Vitals Value Taken Time  BP 129/88 10/06/2017 10:30 AM  Temp 36.4 C 10/06/2017 10:29 AM  Pulse 72 10/06/2017 10:31 AM  Resp 14 10/06/2017 10:31 AM  SpO2 99 % 10/06/2017 10:31 AM  Vitals shown include unvalidated device data.  Last Pain:  Vitals:   10/06/17 0905  TempSrc:   PainSc: 6       Patients Stated Pain Goal: 6 (38/45/36 4680)  Complications: No apparent anesthesia complications

## 2017-10-06 NOTE — Anesthesia Preprocedure Evaluation (Signed)
Anesthesia Evaluation  Patient identified by MRN, date of birth, ID band Patient awake    Reviewed: Allergy & Precautions, NPO status , Patient's Chart, lab work & pertinent test results  Airway Mallampati: I       Dental no notable dental hx. (+) Teeth Intact   Pulmonary Current Smoker,    Pulmonary exam normal breath sounds clear to auscultation       Cardiovascular hypertension, Pt. on medications Normal cardiovascular exam Rhythm:Regular Rate:Normal     Neuro/Psych negative psych ROS   GI/Hepatic   Endo/Other  negative endocrine ROS  Renal/GU      Musculoskeletal   Abdominal Normal abdominal exam  (+)   Peds  Hematology   Anesthesia Other Findings C Kyte  GATED SPECT MYO PERF [facility] STRESS 1D  Order# [id number]  Reading physician: Sanda Klein, MD Ordering physician: Lorretta Harp, MD Study date: 09/26/17 Patient Information   Name MRN Description Charlotte Garcia [id number] 62 y.o. Female Result Notes for MYOCARDIAL PERFUSION IMAGING   Notes recorded by Cristopher Estimable, RN on 09/29/2017 at 10:58 AM EDT pt aware of results ------  Notes recorded by Lorretta Harp, MD on 09/26/2017 at 4:56 PM EDT Essentially normal study. Repeat when clinically indicated.   Vitals   Height Weight BMI (Calculated) 5\' 2"  (1.575 m) 116 lb 12.8 oz (53 kg) 21.36 Study Highlights     The left ventricular ejection fraction is normal (55-65%).  Nuclear stress EF: 56%.  There was no ST segment deviation noted during stress.  No T wave inversion was noted during stress.  The study is normal.  This is a low risk study.   Low risk stress nuclear study with normal perfusion and normal left ventricular regional and global systolic function.      Reproductive/Obstetrics                             Anesthesia Physical Anesthesia Plan  ASA: II  Anesthesia Plan: General    Post-op Pain Management:    Induction: Intravenous  PONV Risk Score and Plan: 4 or greater and Ondansetron, Dexamethasone, Scopolamine patch - Pre-op and Midazolam  Airway Management Planned: LMA  Additional Equipment:   Intra-op Plan:   Post-operative Plan: Extubation in OR  Informed Consent: I have reviewed the patients History and Physical, chart, labs and discussed the procedure including the risks, benefits and alternatives for the proposed anesthesia with the patient or authorized representative who has indicated his/her understanding and acceptance.   Dental advisory given  Plan Discussed with: CRNA and Surgeon  Anesthesia Plan Comments:         Anesthesia Quick Evaluation

## 2017-10-06 NOTE — Discharge Instructions (Signed)
°  Post Anesthesia Home Care Instructions  Activity: Get plenty of rest for the remainder of the day. A responsible individual must stay with you for 24 hours following the procedure.  For the next 24 hours, DO NOT: -Drive a car -Paediatric nurse -Drink alcoholic beverages -Take any medication unless instructed by your physician -Make any legal decisions or sign important papers.  Meals: Start with liquid foods such as gelatin or soup. Progress to regular foods as tolerated. Avoid greasy, spicy, heavy foods. If nausea and/or vomiting occur, drink only clear liquids until the nausea and/or vomiting subsides. Call your physician if vomiting continues.  Special Instructions/Symptoms: Your throat may feel dry or sore from the anesthesia or the breathing tube placed in your throat during surgery. If this causes discomfort, gargle with warm salt water. The discomfort should disappear within 24 hours.  If you had a scopolamine patch placed behind your ear for the management of post- operative nausea and/or vomiting:  1. The medication in the patch is effective for 72 hours, after which it should be removed.  Wrap patch in a tissue and discard in the trash. Wash hands thoroughly with soap and water. 2. You may remove the patch earlier than 72 hours if you experience unpleasant side effects which may include dry mouth, dizziness or visual disturbances. 3. Avoid touching the patch. Wash your hands with soap and water after contact with the patch.   MAY TAKE IBUPROFEN AFTER 4:30 TODAY   DISCHARGE INSTRUCTIONS: D&C / D&E The following instructions have been prepared to help you care for yourself upon your return home.   Personal hygiene:  Use sanitary pads for vaginal drainage, not tampons.  Shower the day after your procedure.  NO tub baths, pools or Jacuzzis for 2-3 weeks.  Wipe front to back after using the bathroom.  Activity and limitations:  Do NOT drive or operate any equipment  for 24 hours. The effects of anesthesia are still present and drowsiness may result.  Do NOT rest in bed all day.  Walking is encouraged.  Walk up and down stairs slowly.  You may resume your normal activity in one to two days or as indicated by your physician.  Sexual activity: NO intercourse for at least 2 weeks after the procedure, or as indicated by your physician.  Diet: Eat a light meal as desired this evening. You may resume your usual diet tomorrow.  Return to work: You may resume your work activities in one to two days or as indicated by your doctor.  What to expect after your surgery: Expect to have vaginal bleeding/discharge for 2-3 days and spotting for up to 10 days. It is not unusual to have soreness for up to 1-2 weeks. You may have a slight burning sensation when you urinate for the first day. Mild cramps may continue for a couple of days. You may have a regular period in 2-6 weeks.  Call your doctor for any of the following:  Excessive vaginal bleeding, saturating and changing one pad every hour.  Inability to urinate 6 hours after discharge from hospital.  Pain not relieved by pain medication.  Fever of 100.4 F or greater.  Unusual vaginal discharge or odor.    Call for appointment

## 2017-10-07 ENCOUNTER — Encounter (HOSPITAL_BASED_OUTPATIENT_CLINIC_OR_DEPARTMENT_OTHER): Payer: Self-pay | Admitting: Obstetrics and Gynecology

## 2017-10-27 ENCOUNTER — Ambulatory Visit: Payer: Managed Care, Other (non HMO) | Admitting: Cardiology

## 2017-11-20 ENCOUNTER — Ambulatory Visit: Payer: Managed Care, Other (non HMO) | Admitting: Cardiology

## 2017-12-10 ENCOUNTER — Ambulatory Visit: Payer: Managed Care, Other (non HMO) | Admitting: Cardiology

## 2017-12-22 ENCOUNTER — Encounter: Payer: Self-pay | Admitting: Cardiology

## 2017-12-22 ENCOUNTER — Ambulatory Visit: Payer: Managed Care, Other (non HMO) | Admitting: Cardiology

## 2017-12-22 DIAGNOSIS — Z8249 Family history of ischemic heart disease and other diseases of the circulatory system: Secondary | ICD-10-CM | POA: Insufficient documentation

## 2017-12-22 DIAGNOSIS — R0989 Other specified symptoms and signs involving the circulatory and respiratory systems: Secondary | ICD-10-CM

## 2017-12-23 ENCOUNTER — Encounter: Payer: Self-pay | Admitting: *Deleted

## 2017-12-26 ENCOUNTER — Ambulatory Visit: Payer: Managed Care, Other (non HMO) | Admitting: Cardiovascular Disease

## 2017-12-26 DIAGNOSIS — R0989 Other specified symptoms and signs involving the circulatory and respiratory systems: Secondary | ICD-10-CM

## 2017-12-29 ENCOUNTER — Encounter: Payer: Self-pay | Admitting: *Deleted

## 2018-10-05 ENCOUNTER — Encounter (HOSPITAL_COMMUNITY): Payer: Self-pay

## 2018-10-05 ENCOUNTER — Emergency Department (HOSPITAL_COMMUNITY): Payer: BLUE CROSS/BLUE SHIELD

## 2018-10-05 ENCOUNTER — Emergency Department (HOSPITAL_COMMUNITY)
Admission: EM | Admit: 2018-10-05 | Discharge: 2018-10-06 | Disposition: A | Discharge status: Home / Self Care | Payer: BLUE CROSS/BLUE SHIELD | Attending: Emergency Medicine | Admitting: Emergency Medicine

## 2018-10-05 ENCOUNTER — Other Ambulatory Visit: Payer: Self-pay

## 2018-10-05 DIAGNOSIS — R1032 Left lower quadrant pain: Secondary | ICD-10-CM | POA: Insufficient documentation

## 2018-10-05 DIAGNOSIS — Z885 Allergy status to narcotic agent status: Secondary | ICD-10-CM | POA: Insufficient documentation

## 2018-10-05 DIAGNOSIS — F17210 Nicotine dependence, cigarettes, uncomplicated: Secondary | ICD-10-CM | POA: Insufficient documentation

## 2018-10-05 DIAGNOSIS — Z79899 Other long term (current) drug therapy: Secondary | ICD-10-CM | POA: Insufficient documentation

## 2018-10-05 DIAGNOSIS — Z20828 Contact with and (suspected) exposure to other viral communicable diseases: Secondary | ICD-10-CM | POA: Insufficient documentation

## 2018-10-05 DIAGNOSIS — J9801 Acute bronchospasm: Secondary | ICD-10-CM

## 2018-10-05 DIAGNOSIS — R0602 Shortness of breath: Secondary | ICD-10-CM | POA: Insufficient documentation

## 2018-10-05 DIAGNOSIS — Z882 Allergy status to sulfonamides status: Secondary | ICD-10-CM | POA: Insufficient documentation

## 2018-10-05 DIAGNOSIS — R1030 Lower abdominal pain, unspecified: Secondary | ICD-10-CM

## 2018-10-05 DIAGNOSIS — I10 Essential (primary) hypertension: Secondary | ICD-10-CM | POA: Insufficient documentation

## 2018-10-05 DIAGNOSIS — J219 Acute bronchiolitis, unspecified: Secondary | ICD-10-CM | POA: Insufficient documentation

## 2018-10-05 DIAGNOSIS — R011 Cardiac murmur, unspecified: Secondary | ICD-10-CM | POA: Insufficient documentation

## 2018-10-05 DIAGNOSIS — Z7982 Long term (current) use of aspirin: Secondary | ICD-10-CM | POA: Insufficient documentation

## 2018-10-05 LAB — COMPREHENSIVE METABOLIC PANEL
ALT: 17 U/L (ref 0–44)
AST: 13 U/L — ABNORMAL LOW (ref 15–41)
Albumin: 4.4 g/dL (ref 3.5–5.0)
Alkaline Phosphatase: 63 U/L (ref 38–126)
Anion gap: 8 (ref 5–15)
BUN: 15 mg/dL (ref 8–23)
CO2: 23 mmol/L (ref 22–32)
Calcium: 9.2 mg/dL (ref 8.9–10.3)
Chloride: 109 mmol/L (ref 98–111)
Creatinine, Ser: 0.64 mg/dL (ref 0.44–1.00)
GFR calc Af Amer: >60 mL/min (ref >60)
GFR calc non Af Amer: >60 mL/min (ref >60)
Glucose, Bld: 96 mg/dL (ref 70–99)
Potassium: 4.1 mmol/L (ref 3.5–5.1)
Sodium: 140 mmol/L (ref 135–145)
Total Bilirubin: 0.6 mg/dL (ref 0.3–1.2)
Total Protein: 7.4 g/dL (ref 6.5–8.1)

## 2018-10-05 LAB — CBC
HCT: 40.1 % (ref 36.0–46.0)
Hemoglobin: 12.6 g/dL (ref 12.0–15.0)
MCH: 30.7 pg (ref 26.0–34.0)
MCHC: 31.4 g/dL (ref 30.0–36.0)
MCV: 97.8 fL (ref 80.0–100.0)
Platelets: 374 10*3/uL (ref 150–400)
RBC: 4.1 MIL/uL (ref 3.87–5.11)
RDW: 14.6 % (ref 11.5–15.5)
WBC: 6.9 10*3/uL (ref 4.0–10.5)
nRBC: 0 % (ref 0.0–0.2)

## 2018-10-05 LAB — LIPASE, BLOOD: Lipase: 35 U/L (ref 11–51)

## 2018-10-05 MED ORDER — ONDANSETRON HCL 4 MG/2ML IJ SOLN
4.0000 mg | Freq: Once | INTRAMUSCULAR | Status: AC
  Administered 2018-10-05: 4 mg via INTRAVENOUS
  Filled 2018-10-05: qty 2

## 2018-10-05 MED ORDER — SODIUM CHLORIDE 0.9 % IV BOLUS
1000.0000 mL | Freq: Once | INTRAVENOUS | Status: AC
  Administered 2018-10-05: 23:00:00 1000 mL via INTRAVENOUS

## 2018-10-05 MED ORDER — ALBUTEROL SULFATE HFA 108 (90 BASE) MCG/ACT IN AERS
2.0000 | INHALATION_SPRAY | Freq: Once | RESPIRATORY_TRACT | Status: AC
  Administered 2018-10-05: 23:00:00 2 via RESPIRATORY_TRACT
  Filled 2018-10-05: qty 6.7

## 2018-10-05 MED ORDER — MORPHINE SULFATE (PF) 4 MG/ML IV SOLN
4.0000 mg | Freq: Once | INTRAVENOUS | Status: AC
  Administered 2018-10-05: 23:00:00 4 mg via INTRAVENOUS
  Filled 2018-10-05: qty 1

## 2018-10-05 MED ORDER — SODIUM CHLORIDE (PF) 0.9 % IJ SOLN
INTRAMUSCULAR | Status: AC
  Administered 2018-10-05: 23:00:00 10 mL
  Filled 2018-10-05: qty 50

## 2018-10-05 MED ORDER — IOHEXOL 300 MG/ML  SOLN
100.0000 mL | Freq: Once | INTRAMUSCULAR | Status: AC | PRN
  Administered 2018-10-06: 100 mL via INTRAVENOUS

## 2018-10-05 NOTE — ED Triage Notes (Addendum)
Pt states has hx of stomach issues. Pt states she is having lower abd pain that goes up the left side. and has a tickle in her chest that makes her cough.

## 2018-10-05 NOTE — ED Provider Notes (Signed)
[facility] DEPT Provider Note   CSN: [id number] Arrival date & time: 10/05/18  1408    History   Chief Complaint Chief Complaint  Patient presents with   Abdominal Pain   Cough    HPI Charlotte Garcia is a 63 y.o. female.     Charlotte Garcia is a 63 y.o. female with a history of diverticulitis, GERD, hypertension, heart murmur, anemia and arthritis, who presents to the ED for 3 days of lower abdominal pain, radiating up the left side of the abdomen.  Pain started on Friday night after eating some sausage that she thinks may have had some seeds or pepper flakes in it that got caught in her diverticuli.  She has history of multiple episodes of diverticulitis in the past that usually start with similar pain across the lower abdomen, worse on the left than right.  She reports low-grade fever yesterday.  She has had multiple episodes of nonbloody vomiting.  She reports some constipation, last able to have a bowel movement about 3 days ago.  No melena or hematochezia.  She denies any dysuria or urinary frequency, no hematuria or flank pain.  Patient is postmenopausal and denies any vaginal bleeding or discharge.  She reports the symptoms feel very similar to previous episodes of diverticulitis.  She has not taken anything for pain prior to arrival.  No other aggravating or alleviating factors.   Patient also reports that over the past few days she has had a few episodes of shortness of breath, she reports that when she lays down to go to sleep she feels almost a bit wheezy and this causes her to call, no associated chest pain.  No productive cough, no fevers.  She reports that 1 of her family members who she has not been in direct contact with tested positive for coronavirus she denies any other known contacts.  Reports the symptoms she has been experiencing feels similar to her previous asthma but she has been out of an inhaler.  No other aggravating  factors.     Past Medical History:  Diagnosis Date   Anemia    Arthritis    Hands and generalized   Chest discomfort 09/17/2017   per pt weight on chest:   cardiologist consult w/ dr berry 09-23-2017  normal nuclear stress test    Diverticulosis    Endometrial polyp    Exposure to TB    GERD (gastroesophageal reflux disease)    Headache    History of diverticulitis of colon    recurrent 2014; 2015; x2 in 2017   History of ovarian cyst    Hypertension    Murmur, heart    Tobacco abuse    Uterine fibroid     Patient Active Problem List   Diagnosis Date Noted   Family history of coronary artery disease in father 12/22/2017   Endometrial polyp 10/06/2017    Class: Present on Admission   Diverticulitis 11/06/2013   Atypical chest pain 11/06/2013   Hypokalemia 11/06/2013   Acute diverticulitis 02/27/2013   Essential hypertension    Tobacco abuse     Past Surgical History:  Procedure Laterality Date   CARDIOVASCULAR STRESS TEST  09-26-2017   dr berry   Low risk nuclear study w/ no ischemia/  normal LV function and wall motion ,  nuclear stress ef 56%   COLONOSCOPY     CYSTECTOMY     forehead; back of head   DILATATION & CURETTAGE/HYSTEROSCOPY WITH MYOSURE  N/A 10/06/2017   Procedure: DILATATION & CURETTAGE/HYSTEROSCOPY WITH MYOSURE;  Surgeon: Arvella Nigh, MD;  Location: [facility];  Service: Gynecology;  Laterality: N/A;   DILATION AND CURETTAGE OF UTERUS     TRANSTHORACIC ECHOCARDIOGRAM  09/08/2015   mild focal basal hypertrophy,  ef 60-65%/  trivial MR   WRIST SURGERY     ganglion cyst     OB History    Gravida  3   Para  1   Term  1   Preterm      AB  2   Living  1     SAB  2   TAB      Ectopic      Multiple      Live Births               Home Medications    Prior to Admission medications   Medication Sig Start Date End Date Taking? Authorizing Provider  aspirin EC 81 MG tablet Take 81 mg  by mouth daily as needed for moderate pain.   Yes [provider]  diphenhydramine-acetaminophen (TYLENOL PM) 25-500 MG TABS tablet Take 2 tablets by mouth at bedtime as needed (pain).   Yes [provider]  Fiber Adult Gummies 2 g CHEW Chew 1 tablet by mouth daily.   Yes [provider]  lisinopril (ZESTRIL) 5 MG tablet Take 5 mg by mouth daily.   Yes [provider]  Multiple Vitamins-Minerals (MULTIVITAMIN ADULT) CHEW Chew 1 tablet by mouth daily.   Yes [provider]  dicyclomine (BENTYL) 20 MG tablet Take 1 tablet (20 mg total) by mouth 2 (two) times daily. 10/06/18   Jacqlyn Larsen, PA-C  lisinopril (PRINIVIL,ZESTRIL) 5 MG tablet Take 1 tablet (5 mg total) by mouth daily. Patient taking differently: Take 5 mg by mouth every evening.  09/23/17 12/22/17  Lorretta Harp, MD  ondansetron (ZOFRAN ODT) 4 MG disintegrating tablet 4mg  ODT q4 hours prn nausea/vomit 10/06/18   Jacqlyn Larsen, PA-C    Family History Family History  Problem Relation Age of Onset   Renal Disease Brother    Hypertension Father    Heart disease Father        MI in his 41's   Hypertension Mother    Heart disease Mother     Social History Social History   Tobacco Use   Smoking status: Current Some Day Smoker    Packs/day: 0.25    Years: 25.00    Pack years: 6.25    Types: Cigarettes   Smokeless tobacco: Never Used  Substance Use Topics   Alcohol use: Yes    Comment: 1-2 glasses of wine 3 times a week   Drug use: Yes    Types: Marijuana    Comment: 3 weeks ago as of 09/17/2017     Allergies   Sulfa antibiotics, Codeine, and Morphine and related   Review of Systems Review of Systems  Constitutional: Negative for chills and fever.  HENT: Negative for congestion, rhinorrhea and sore throat.   Eyes: Negative for visual disturbance.  Respiratory: Positive for cough and shortness of breath.   Cardiovascular: Negative for chest pain.   Gastrointestinal: Positive for abdominal pain, constipation, nausea and vomiting. Negative for blood in stool and diarrhea.  Genitourinary: Negative for dysuria, flank pain and frequency.  Musculoskeletal: Negative for arthralgias and myalgias.  Skin: Negative for color change and rash.  Neurological: Negative for dizziness, syncope and light-headedness.  Physical Exam Updated Vital Signs BP (!) 138/109    Pulse 73    Temp 99.3 F (37.4 C) (Oral)    Resp 14    Wt 57.7 kg    SpO2 99%    BMI 23.27 kg/m   Physical Exam Vitals signs and nursing note reviewed.  Constitutional:      General: She is not in acute distress.    Appearance: She is well-developed and normal weight. She is not diaphoretic.  HENT:     Head: Normocephalic and atraumatic.     Mouth/Throat:     Mouth: Mucous membranes are moist.     Pharynx: Oropharynx is clear.  Eyes:     General:        Right eye: No discharge.        Left eye: No discharge.     Pupils: Pupils are equal, round, and reactive to light.  Neck:     Musculoskeletal: Neck supple.  Cardiovascular:     Rate and Rhythm: Normal rate and regular rhythm.     Heart sounds: Normal heart sounds. No murmur. No friction rub. No gallop.   Pulmonary:     Effort: Pulmonary effort is normal. No respiratory distress.     Breath sounds: Normal breath sounds. No wheezing or rales.     Comments: Respirations equal and unlabored, patient able to speak in full sentences, lungs clear to auscultation bilaterally Abdominal:     General: Bowel sounds are normal. There is no distension.     Palpations: Abdomen is soft. There is no mass.     Tenderness: There is abdominal tenderness in the right lower quadrant, periumbilical area, suprapubic area, left upper quadrant and left lower quadrant. There is no guarding.     Comments: Abdomen is soft, nondistended, bowel sounds present throughout, there is tenderness across the lower abdomen and left upper quadrants but  there is no guarding or peritoneal signs.  No CVA tenderness bilaterally.  Musculoskeletal:        General: No deformity.  Skin:    General: Skin is warm and dry.     Capillary Refill: Capillary refill takes less than 2 seconds.  Neurological:     Mental Status: She is alert.     Coordination: Coordination normal.     Comments: Speech is clear, able to follow commands Moves extremities without ataxia, coordination intact  Psychiatric:        Mood and Affect: Mood normal.        Behavior: Behavior normal.      ED Treatments / Results  Labs (all labs ordered are listed, but only abnormal results are displayed) Labs Reviewed  COMPREHENSIVE METABOLIC PANEL - Abnormal; Notable for the following components:      Result Value   AST 13 (*)    All other components within normal limits  URINALYSIS, ROUTINE W REFLEX MICROSCOPIC - Abnormal; Notable for the following components:   Color, Urine STRAW (*)    Specific Gravity, Urine >1.046 (*)    All other components within normal limits  NOVEL CORONAVIRUS, NAA (HOSPITAL ORDER, SEND-OUT TO REF LAB)  LIPASE, BLOOD  CBC    EKG EKG Interpretation  Date/Time:  Tuesday October 06 2018 00:35:47 EDT Ventricular Rate:  70 PR Interval:    QRS Duration: 119 QT Interval:  434 QTC Calculation: 469 R Axis:   50 Text Interpretation:  Sinus rhythm Nonspecific intraventricular conduction delay Baseline wander in lead(s) II III aVF Confirmed by Randal Buba, April ([id number])  on 10/06/2018 12:55:30 AM   Radiology Dg Chest 2 View  Result Date: 10/05/2018 CLINICAL DATA:  Cough. Smoker. Hypertension. EXAM: CHEST - 2 VIEW COMPARISON:  09/08/2015 FINDINGS: Midline trachea. Normal heart size. Tortuous thoracic aorta. No pleural effusion or pneumothorax. Suspect left lower lobe scarring, similar. IMPRESSION: No acute cardiopulmonary disease. Electronically Signed   By: Abigail Miyamoto M.D.   On: 10/05/2018 14:38   Ct Abdomen Pelvis W Contrast  Result Date:  10/06/2018 CLINICAL DATA:  Lower abdominal pain, left-sided EXAM: CT ABDOMEN AND PELVIS WITH CONTRAST TECHNIQUE: Multidetector CT imaging of the abdomen and pelvis was performed using the standard protocol following bolus administration of intravenous contrast. CONTRAST:  165mL OMNIPAQUE IOHEXOL 300 MG/ML  SOLN COMPARISON:  CT abdomen pelvis Jul 31, 2017 FINDINGS: LOWER CHEST: There is no basilar pleural or apical pericardial effusion. HEPATOBILIARY: The hepatic contours and density are normal. There is no intra- or extrahepatic biliary dilatation. The gallbladder is normal. PANCREAS: The pancreatic parenchymal contours are normal and there is no ductal dilatation. There is no peripancreatic fluid collection. SPLEEN: Normal. ADRENALS/URINARY TRACT: --Adrenal glands: Normal. --Right kidney/ureter: No hydronephrosis, nephroureterolithiasis, perinephric stranding or solid renal mass. --Left kidney/ureter: No hydronephrosis, nephroureterolithiasis, perinephric stranding or solid renal mass. --Urinary bladder: Normal for degree of distention STOMACH/BOWEL: --Stomach/Duodenum: There is no hiatal hernia or other gastric abnormality. The duodenal course and caliber are normal. --Small bowel: No dilatation or inflammation. --Colon: Rectosigmoid diverticulosis without acute inflammation. --Appendix: Normal. VASCULAR/LYMPHATIC: Normal course and caliber of the major abdominal vessels. No abdominal or pelvic lymphadenopathy. REPRODUCTIVE: Multiple small uterine fibroids.  No adnexal mass. MUSCULOSKELETAL. No bony spinal canal stenosis or focal osseous abnormality. OTHER: None. IMPRESSION: No acute abnormality of the abdomen or pelvis. Electronically Signed   By: Ulyses Jarred M.D.   On: 10/06/2018 00:59    Procedures Procedures (including critical care time)  Medications Ordered in ED Medications  sodium chloride 0.9 % bolus 1,000 mL (0 mLs Intravenous Stopped 10/06/18 0056)  morphine 4 MG/ML injection 4 mg (4 mg  Intravenous Given 10/05/18 2326)  ondansetron (ZOFRAN) injection 4 mg (4 mg Intravenous Given 10/05/18 2326)  albuterol (VENTOLIN HFA) 108 (90 Base) MCG/ACT inhaler 2 puff (2 puffs Inhalation Given 10/05/18 2326)  iohexol (OMNIPAQUE) 300 MG/ML solution 100 mL (100 mLs Intravenous Contrast Given 10/06/18 0011)  sodium chloride (PF) 0.9 % injection (10 mLs  Given 10/05/18 2320)  morphine 4 MG/ML injection 4 mg (4 mg Intravenous Given 10/06/18 0206)     Initial Impression / Assessment and Plan / ED Course  I have reviewed the triage vital signs and the nursing notes.  Pertinent labs & imaging results that were available during my care of the patient were reviewed by me and considered in my medical decision making (see chart for details).  Patient presents with lower abdominal pain over the past 3 days.  Associated constipation, nausea and vomiting, symptoms feel similar to her previous diverticulitis, she ate something with seeds in it and thinks it may have flared it. She also complains of some intermittent SOB and cough when she lays down, no associated SOB, no direct COVID contacts. Will check abdominal labs, CT abdoemen/pelvis, EKG and CXR. I suspect recurrence of diverticulitis. SOB feels like previous asthma, will give albuterol. Fluids, morphine and zofran for abdominal pain.  Labs overall reassuring with no leukocytosis and normal hemoglobin, no acute electrolyte derangements, normal renal and liver function normal lipase.  Urinalysis without signs of infection.  EKG unremarkable and chest x-ray clear,  no evidence of pneumonia, symptoms resolved with albuterol.  CT does not show any evidence of diverticulitis or other acute abnormality within the abdomen or pelvis.  Discussed reassuring results with patient.  Given that she has been having some constipation she could still be having pain from that or irritation of her diverticuli from recently eating seeds but there is no evidence of infection  that would require antibiotics.  Discussed this reassuring work-up with the patient.  She will be discharged home with Bentyl Zofran and outpatient follow-up with her GI doctor.  Strict return precautions discussed.  Patient expresses understanding and agreement.  Final Clinical Impressions(s) / ED Diagnoses   Final diagnoses:  Lower abdominal pain  Bronchospasm    ED Discharge Orders         Ordered    dicyclomine (BENTYL) 20 MG tablet  2 times daily     10/06/18 0253    ondansetron (ZOFRAN ODT) 4 MG disintegrating tablet     10/06/18 0253           Jacqlyn Larsen, PA-C 10/06/18 2044    Daleen Bo, MD 10/08/18 0700

## 2018-10-06 ENCOUNTER — Other Ambulatory Visit: Payer: Self-pay

## 2018-10-06 ENCOUNTER — Encounter (HOSPITAL_COMMUNITY): Payer: Self-pay | Admitting: *Deleted

## 2018-10-06 LAB — URINALYSIS, ROUTINE W REFLEX MICROSCOPIC
Bilirubin Urine: NEGATIVE
Glucose, UA: NEGATIVE mg/dL
Hgb urine dipstick: NEGATIVE
Ketones, ur: NEGATIVE mg/dL
Leukocytes,Ua: NEGATIVE
Nitrite: NEGATIVE
Protein, ur: NEGATIVE mg/dL
Specific Gravity, Urine: >1.046 — ABNORMAL HIGH (ref 1.005–1.030)
pH: 7 (ref 5.0–8.0)

## 2018-10-06 MED ORDER — DICYCLOMINE HCL 20 MG PO TABS: 20.0000 mg | ORAL_TABLET | Freq: Two times a day (BID) | ORAL | 0 refills | Status: DC

## 2018-10-06 MED ORDER — MORPHINE SULFATE (PF) 4 MG/ML IV SOLN
4.0000 mg | Freq: Once | INTRAVENOUS | Status: AC
  Administered 2018-10-06: 02:00:00 4 mg via INTRAVENOUS
  Filled 2018-10-06: qty 1

## 2018-10-06 MED ORDER — ONDANSETRON 4 MG PO TBDP: ORAL_TABLET | ORAL | 0 refills | Status: AC

## 2018-10-06 NOTE — ED Notes (Signed)
Pt waiting for family to arrive and transport her home d/t pt have received several doses of Morphine.

## 2018-10-06 NOTE — ED Notes (Signed)
Pt unable to find transportation at this time remains in room 15. Pt remains unable to drive self home d/t receiving morphine less than 2 hrs ago.

## 2018-10-06 NOTE — ED Notes (Signed)
Pt sent with tech to auto driven by husband. Tech returns and states husband was waiting at [facility] for pt and pt statres she was not waiting and drove herself home. Pt was previously warned that she should not drive d/t receiving SH70 and pt verbalized understanding. Pt received MS04 at 02:06.

## 2018-10-06 NOTE — Discharge Instructions (Addendum)
Your CT scan did not show evidence of diverticulitis or other acute abnormality today.  Your labs are otherwise reassuring, and chest x-ray was clear.  Pain may be due to irritation from food, or constipation.  You can use Bentyl as needed for pain, Zofran for nausea.  Please start using MiraLAX daily to help with regular bowel movements.  You do have a coronavirus test pending please quarantine at home until you are called with results. You may use albuterol as needed.

## 2018-10-08 LAB — NOVEL CORONAVIRUS, NAA (HOSP ORDER, SEND-OUT TO REF LAB; TAT 18-24 HRS): SARS-CoV-2, NAA: NOT DETECTED

## 2019-06-10 ENCOUNTER — Ambulatory Visit: Payer: BLUE CROSS/BLUE SHIELD | Attending: Internal Medicine

## 2019-06-10 DIAGNOSIS — Z23 Encounter for immunization: Secondary | ICD-10-CM

## 2019-06-10 NOTE — Progress Notes (Signed)
   Covid-19 Vaccination Clinic  Name:  MEKKA BLUMENBERG    MRN: [id number] DOB: [birth year]  06/10/2019  Ms. Gothard was observed post Covid-19 immunization for 15 minutes without incident. She was provided with Vaccine Information Sheet and instruction to access the V-Safe system.   Ms. Burn was instructed to call 911 with any severe reactions post vaccine: Marland Kitchen Difficulty breathing  . Swelling of face and throat  . A fast heartbeat  . A bad rash all over body  . Dizziness and weakness   Immunizations Administered    Name Date Dose VIS Date Route   Pfizer COVID-19 Vaccine 06/10/2019  9:05 AM 0.3 mL 02/26/2019 Intramuscular   Manufacturer: [place]   Lot: [id number]   [place]: [id number]

## 2019-07-01 ENCOUNTER — Ambulatory Visit: Payer: BLUE CROSS/BLUE SHIELD | Attending: Internal Medicine

## 2019-07-01 DIAGNOSIS — Z23 Encounter for immunization: Secondary | ICD-10-CM

## 2019-07-01 NOTE — Progress Notes (Signed)
   Covid-19 Vaccination Clinic  Name:  Charlotte Garcia    MRN: [id number] DOB: 1955-09-11  07/01/2019  [name] was observed post Covid-19 immunization for 15 minutes without incident. She was provided with Vaccine Information Sheet and instruction to access the V-Safe system.   [name] was instructed to call 911 with any severe reactions post vaccine: Marland Kitchen Difficulty breathing  . Swelling of face and throat  . A fast heartbeat  . A bad rash all over body  . Dizziness and weakness   Immunizations Administered    Name Date Dose VIS Date Route   Pfizer COVID-19 Vaccine 07/01/2019 10:33 AM 0.3 mL 02/26/2019 Intramuscular   Manufacturer: [place]   Lot: [id number]   [place]: [id number]

## 2019-07-05 ENCOUNTER — Ambulatory Visit: Payer: BLUE CROSS/BLUE SHIELD

## 2019-08-24 ENCOUNTER — Emergency Department (HOSPITAL_COMMUNITY): Payer: Self-pay

## 2019-08-24 ENCOUNTER — Emergency Department (HOSPITAL_COMMUNITY)
Admission: EM | Admit: 2019-08-24 | Discharge: 2019-08-24 | Disposition: A | Discharge status: Home / Self Care | Payer: Self-pay | Attending: Emergency Medicine | Admitting: Emergency Medicine

## 2019-08-24 ENCOUNTER — Other Ambulatory Visit: Payer: Self-pay

## 2019-08-24 ENCOUNTER — Encounter (HOSPITAL_COMMUNITY): Payer: Self-pay | Admitting: Emergency Medicine

## 2019-08-24 DIAGNOSIS — F1210 Cannabis abuse, uncomplicated: Secondary | ICD-10-CM | POA: Insufficient documentation

## 2019-08-24 DIAGNOSIS — Z7982 Long term (current) use of aspirin: Secondary | ICD-10-CM | POA: Insufficient documentation

## 2019-08-24 DIAGNOSIS — F17210 Nicotine dependence, cigarettes, uncomplicated: Secondary | ICD-10-CM | POA: Insufficient documentation

## 2019-08-24 DIAGNOSIS — K5732 Diverticulitis of large intestine without perforation or abscess without bleeding: Secondary | ICD-10-CM | POA: Insufficient documentation

## 2019-08-24 DIAGNOSIS — Z886 Allergy status to analgesic agent status: Secondary | ICD-10-CM | POA: Insufficient documentation

## 2019-08-24 DIAGNOSIS — K5792 Diverticulitis of intestine, part unspecified, without perforation or abscess without bleeding: Secondary | ICD-10-CM

## 2019-08-24 DIAGNOSIS — I10 Essential (primary) hypertension: Secondary | ICD-10-CM | POA: Insufficient documentation

## 2019-08-24 DIAGNOSIS — Z79899 Other long term (current) drug therapy: Secondary | ICD-10-CM | POA: Insufficient documentation

## 2019-08-24 DIAGNOSIS — Z885 Allergy status to narcotic agent status: Secondary | ICD-10-CM | POA: Insufficient documentation

## 2019-08-24 DIAGNOSIS — R112 Nausea with vomiting, unspecified: Secondary | ICD-10-CM | POA: Insufficient documentation

## 2019-08-24 DIAGNOSIS — Z882 Allergy status to sulfonamides status: Secondary | ICD-10-CM | POA: Insufficient documentation

## 2019-08-24 LAB — COMPREHENSIVE METABOLIC PANEL
ALT: 14 U/L (ref 0–44)
AST: 12 U/L — ABNORMAL LOW (ref 15–41)
Albumin: 4.3 g/dL (ref 3.5–5.0)
Alkaline Phosphatase: 58 U/L (ref 38–126)
Anion gap: 8 (ref 5–15)
BUN: 9 mg/dL (ref 8–23)
CO2: 24 mmol/L (ref 22–32)
Calcium: 9.2 mg/dL (ref 8.9–10.3)
Chloride: 108 mmol/L (ref 98–111)
Creatinine, Ser: 0.56 mg/dL (ref 0.44–1.00)
GFR calc Af Amer: >60 mL/min (ref >60)
GFR calc non Af Amer: >60 mL/min (ref >60)
Glucose, Bld: 99 mg/dL (ref 70–99)
Potassium: 3.4 mmol/L — ABNORMAL LOW (ref 3.5–5.1)
Sodium: 140 mmol/L (ref 135–145)
Total Bilirubin: 1.1 mg/dL (ref 0.3–1.2)
Total Protein: 7.4 g/dL (ref 6.5–8.1)

## 2019-08-24 LAB — CBC
HCT: 38 % (ref 36.0–46.0)
Hemoglobin: 12.5 g/dL (ref 12.0–15.0)
MCH: 31.6 pg (ref 26.0–34.0)
MCHC: 32.9 g/dL (ref 30.0–36.0)
MCV: 96.2 fL (ref 80.0–100.0)
Platelets: 358 10*3/uL (ref 150–400)
RBC: 3.95 MIL/uL (ref 3.87–5.11)
RDW: 14.2 % (ref 11.5–15.5)
WBC: 9.7 10*3/uL (ref 4.0–10.5)
nRBC: 0 % (ref 0.0–0.2)

## 2019-08-24 LAB — LIPASE, BLOOD: Lipase: 24 U/L (ref 11–51)

## 2019-08-24 MED ORDER — LISINOPRIL 5 MG PO TABS: 5.0000 mg | ORAL_TABLET | Freq: Every day | ORAL | 0 refills | Status: DC

## 2019-08-24 MED ORDER — SODIUM CHLORIDE (PF) 0.9 % IJ SOLN
INTRAMUSCULAR | Status: AC
  Filled 2019-08-24: qty 50

## 2019-08-24 MED ORDER — IOHEXOL 300 MG/ML  SOLN
100.0000 mL | Freq: Once | INTRAMUSCULAR | Status: AC | PRN
  Administered 2019-08-24: 100 mL via INTRAVENOUS

## 2019-08-24 MED ORDER — AMOXICILLIN-POT CLAVULANATE 875-125 MG PO TABS: 1.0000 | ORAL_TABLET | Freq: Two times a day (BID) | ORAL | 0 refills | Status: DC

## 2019-08-24 MED ORDER — SODIUM CHLORIDE 0.9% FLUSH: 3.0000 mL | Freq: Once | INTRAVENOUS | Status: DC

## 2019-08-24 MED ORDER — ONDANSETRON 4 MG PO TBDP: 4.0000 mg | ORAL_TABLET | Freq: Once | ORAL | Status: DC | PRN

## 2019-08-24 MED ORDER — SODIUM CHLORIDE 0.9 % IV SOLN: INTRAVENOUS | Status: DC

## 2019-08-24 MED ORDER — HYDROMORPHONE HCL 1 MG/ML IJ SOLN
1.0000 mg | Freq: Once | INTRAMUSCULAR | Status: AC
  Administered 2019-08-24: 1 mg via INTRAVENOUS
  Filled 2019-08-24: qty 1

## 2019-08-24 MED ORDER — METOCLOPRAMIDE HCL 5 MG/ML IJ SOLN
10.0000 mg | Freq: Once | INTRAMUSCULAR | Status: AC
  Administered 2019-08-24: 10 mg via INTRAVENOUS
  Filled 2019-08-24: qty 2

## 2019-08-24 MED ORDER — ONDANSETRON HCL 4 MG/2ML IJ SOLN
4.0000 mg | Freq: Once | INTRAMUSCULAR | Status: AC
  Administered 2019-08-24: 4 mg via INTRAVENOUS
  Filled 2019-08-24: qty 2

## 2019-08-24 MED ORDER — IOHEXOL 9 MG/ML PO SOLN
ORAL | Status: AC
  Administered 2019-08-24: 1000 mL via ORAL
  Filled 2019-08-24: qty 1000

## 2019-08-24 MED ORDER — OXYCODONE-ACETAMINOPHEN 5-325 MG PO TABS: 2.0000 | ORAL_TABLET | ORAL | 0 refills | Status: DC | PRN

## 2019-08-24 MED ORDER — IOHEXOL 9 MG/ML PO SOLN: 1000.0000 mL | ORAL | Status: AC

## 2019-08-24 NOTE — ED Provider Notes (Signed)
[facility] DEPT Provider Note   CSN: [id number] Arrival date & time: 08/24/19  3546     History Chief Complaint  Patient presents with  . Abdominal Pain    Charlotte Garcia is a 64 y.o. female.  64 year old female who has history of diverticulitis presents with left lower quadrant domino pain times several days.  Symptoms feel similar to her diverticulitis.  She has had nausea but no diarrhea.  Has had constipation.  Some emesis.  Pain is not been colicky and not associated urinary symptoms.  No treatment use prior to arrival        Past Medical History:  Diagnosis Date  . Anemia   . Arthritis    Hands and generalized  . Chest discomfort 09/17/2017   per pt weight on chest:   cardiologist consult w/ dr berry 09-23-2017  normal nuclear stress test   . Diverticulosis   . Endometrial polyp   . Exposure to TB   . GERD (gastroesophageal reflux disease)   . Headache   . History of diverticulitis of colon    recurrent 2014; 2015; x2 in 2017  . History of ovarian cyst   . Hypertension   . Murmur, heart   . Tobacco abuse   . Uterine fibroid     Patient Active Problem List   Diagnosis Date Noted  . Family history of coronary artery disease in father 12/22/2017  . Endometrial polyp 10/06/2017    Class: Present on Admission  . Diverticulitis 11/06/2013  . Atypical chest pain 11/06/2013  . Hypokalemia 11/06/2013  . Acute diverticulitis 02/27/2013  . Essential hypertension   . Tobacco abuse     Past Surgical History:  Procedure Laterality Date  . CARDIOVASCULAR STRESS TEST  09-26-2017   dr berry   Low risk nuclear study w/ no ischemia/  normal LV function and wall motion ,  nuclear stress ef 56%  . COLONOSCOPY    . CYSTECTOMY     forehead; back of head  . DILATATION & CURETTAGE/HYSTEROSCOPY WITH MYOSURE N/A 10/06/2017   Procedure: DILATATION & CURETTAGE/HYSTEROSCOPY WITH MYOSURE;  Surgeon: Arvella Nigh, MD;  Location: [facility];  Service: Gynecology;  Laterality: N/A;  . DILATION AND CURETTAGE OF UTERUS    . TRANSTHORACIC ECHOCARDIOGRAM  09/08/2015   mild focal basal hypertrophy,  ef 60-65%/  trivial MR  . WRIST SURGERY     ganglion cyst     OB History    Gravida  3   Para  1   Term  1   Preterm      AB  2   Living  1     SAB  2   TAB      Ectopic      Multiple      Live Births              Family History  Problem Relation Age of Onset  . Renal Disease Brother   . Hypertension Father   . Heart disease Father        MI in his 109's  . Hypertension Mother   . Heart disease Mother     Social History   Tobacco Use  . Smoking status: Current Some Day Smoker    Packs/day: 0.25    Years: 25.00    Pack years: 6.25    Types: Cigarettes  . Smokeless tobacco: Never Used  Substance Use Topics  . Alcohol use: Yes    Comment:  1-2 glasses of wine 3 times a week  . Drug use: Yes    Types: Marijuana    Comment: 3 weeks ago as of 09/17/2017    Home Medications Prior to Admission medications   Medication Sig Start Date End Date Taking? Authorizing Provider  aspirin EC 81 MG tablet Take 81 mg by mouth daily.    Yes [provider]  diphenhydramine-acetaminophen (TYLENOL PM) 25-500 MG TABS tablet Take 2 tablets by mouth at bedtime as needed (pain).   Yes [provider]  Fiber Adult Gummies 2 g CHEW Chew 1 tablet by mouth daily.   Yes [provider]  lisinopril (ZESTRIL) 5 MG tablet Take 5 mg by mouth daily.   Yes [provider]  Multiple Vitamins-Minerals (MULTIVITAMIN ADULT) CHEW Chew 1 tablet by mouth daily.   Yes [provider]  dicyclomine (BENTYL) 20 MG tablet Take 1 tablet (20 mg total) by mouth 2 (two) times daily. Patient not taking: Reported on 08/24/2019 10/06/18   Jacqlyn Larsen, PA-C  lisinopril (PRINIVIL,ZESTRIL) 5 MG tablet Take 1 tablet (5 mg total) by mouth daily. Patient taking differently: Take 5 mg by mouth every  evening.  09/23/17 12/22/17  Lorretta Harp, MD  ondansetron (ZOFRAN ODT) 4 MG disintegrating tablet 4mg  ODT q4 hours prn nausea/vomit Patient not taking: Reported on 08/24/2019 10/06/18   Jacqlyn Larsen, PA-C    Allergies    Sulfa antibiotics, Codeine, and Morphine and related  Review of Systems   Review of Systems  All other systems reviewed and are negative.   Physical Exam Updated Vital Signs BP (!) 159/95 (BP Location: Left Arm)   Temp 99.7 F (37.6 C) (Oral)   Resp 16   Ht 1.549 m (5\' 1" )   Wt 54.4 kg   SpO2 99%   BMI 22.67 kg/m   Physical Exam Vitals and nursing note reviewed.  Constitutional:      General: She is not in acute distress.    Appearance: Normal appearance. She is well-developed. She is not toxic-appearing.  HENT:     Head: Normocephalic and atraumatic.  Eyes:     General: Lids are normal.     Conjunctiva/sclera: Conjunctivae normal.     Pupils: Pupils are equal, round, and reactive to light.  Neck:     Thyroid: No thyroid mass.     Trachea: No tracheal deviation.  Cardiovascular:     Rate and Rhythm: Normal rate and regular rhythm.     Heart sounds: Normal heart sounds. No murmur. No gallop.   Pulmonary:     Effort: Pulmonary effort is normal. No respiratory distress.     Breath sounds: Normal breath sounds. No stridor. No decreased breath sounds, wheezing, rhonchi or rales.  Abdominal:     General: Bowel sounds are normal. There is no distension.     Palpations: Abdomen is soft.     Tenderness: There is abdominal tenderness in the left lower quadrant. There is guarding. There is no rebound.    Musculoskeletal:        General: No tenderness. Normal range of motion.     Cervical back: Normal range of motion and neck supple.  Skin:    General: Skin is warm and dry.     Findings: No abrasion or rash.  Neurological:     Mental Status: She is alert and oriented to person, place, and time.     GCS: GCS eye subscore is 4. GCS verbal subscore is 5.  GCS motor subscore is 6.     Cranial Nerves: No cranial nerve deficit.     Sensory: No sensory deficit.  Psychiatric:        Speech: Speech normal.        Behavior: Behavior normal.     ED Results / Procedures / Treatments   Labs (all labs ordered are listed, but only abnormal results are displayed) Labs Reviewed  CBC  LIPASE, BLOOD  COMPREHENSIVE METABOLIC PANEL  URINALYSIS, ROUTINE W REFLEX MICROSCOPIC    EKG None  Radiology No results found.  Procedures Procedures (including critical care time)  Medications Ordered in ED Medications  sodium chloride flush (NS) 0.9 % injection 3 mL (3 mLs Intravenous Not Given 08/24/19 0707)  ondansetron (ZOFRAN-ODT) disintegrating tablet 4 mg (has no administration in time range)  0.9 %  sodium chloride infusion (has no administration in time range)  HYDROmorphone (DILAUDID) injection 1 mg (has no administration in time range)  ondansetron (ZOFRAN) injection 4 mg (has no administration in time range)    ED Course  I have reviewed the triage vital signs and the nursing notes.  Pertinent labs & imaging results that were available during my care of the patient were reviewed by me and considered in my medical decision making (see chart for details).    MDM Rules/Calculators/A&P                      Patient medicated for pain here and labs were reviewed and no leukocytosis.  CT scan shows acute uncomplicated diverticulitis.  We placed on Augmentin and given prescription for pain medication and she will follow with her doctor Final Clinical Impression(s) / ED Diagnoses Final diagnoses:  None    Rx / DC Orders ED Discharge Orders    None       Lacretia Leigh, MD 08/24/19 1318

## 2019-08-24 NOTE — ED Notes (Signed)
Transported to CT at this time. 

## 2019-09-19 ENCOUNTER — Other Ambulatory Visit: Payer: Self-pay

## 2019-09-19 ENCOUNTER — Emergency Department (HOSPITAL_COMMUNITY): Payer: Self-pay

## 2019-09-19 ENCOUNTER — Encounter (HOSPITAL_COMMUNITY): Payer: Self-pay | Admitting: *Deleted

## 2019-09-19 ENCOUNTER — Inpatient Hospital Stay (HOSPITAL_COMMUNITY)
Admission: EM | Admit: 2019-09-19 | Discharge: 2019-09-22 | DRG: 392 | Disposition: A | Discharge status: Home / Self Care | Payer: Self-pay | Attending: Internal Medicine | Admitting: Internal Medicine

## 2019-09-19 DIAGNOSIS — R634 Abnormal weight loss: Secondary | ICD-10-CM | POA: Diagnosis present

## 2019-09-19 DIAGNOSIS — K5792 Diverticulitis of intestine, part unspecified, without perforation or abscess without bleeding: Secondary | ICD-10-CM

## 2019-09-19 DIAGNOSIS — Z882 Allergy status to sulfonamides status: Secondary | ICD-10-CM

## 2019-09-19 DIAGNOSIS — R6881 Early satiety: Secondary | ICD-10-CM | POA: Diagnosis present

## 2019-09-19 DIAGNOSIS — K30 Functional dyspepsia: Secondary | ICD-10-CM | POA: Diagnosis present

## 2019-09-19 DIAGNOSIS — R1310 Dysphagia, unspecified: Secondary | ICD-10-CM | POA: Diagnosis present

## 2019-09-19 DIAGNOSIS — D649 Anemia, unspecified: Secondary | ICD-10-CM | POA: Diagnosis present

## 2019-09-19 DIAGNOSIS — M519 Unspecified thoracic, thoracolumbar and lumbosacral intervertebral disc disorder: Secondary | ICD-10-CM | POA: Diagnosis present

## 2019-09-19 DIAGNOSIS — Z800 Family history of malignant neoplasm of digestive organs: Secondary | ICD-10-CM

## 2019-09-19 DIAGNOSIS — Z885 Allergy status to narcotic agent status: Secondary | ICD-10-CM

## 2019-09-19 DIAGNOSIS — F17210 Nicotine dependence, cigarettes, uncomplicated: Secondary | ICD-10-CM | POA: Diagnosis present

## 2019-09-19 DIAGNOSIS — Z6823 Body mass index (BMI) 23.0-23.9, adult: Secondary | ICD-10-CM

## 2019-09-19 DIAGNOSIS — E876 Hypokalemia: Secondary | ICD-10-CM | POA: Diagnosis present

## 2019-09-19 DIAGNOSIS — Z792 Long term (current) use of antibiotics: Secondary | ICD-10-CM

## 2019-09-19 DIAGNOSIS — Z7982 Long term (current) use of aspirin: Secondary | ICD-10-CM

## 2019-09-19 DIAGNOSIS — F1290 Cannabis use, unspecified, uncomplicated: Secondary | ICD-10-CM | POA: Diagnosis present

## 2019-09-19 DIAGNOSIS — I10 Essential (primary) hypertension: Secondary | ICD-10-CM | POA: Diagnosis present

## 2019-09-19 DIAGNOSIS — Z720 Tobacco use: Secondary | ICD-10-CM | POA: Diagnosis present

## 2019-09-19 DIAGNOSIS — K219 Gastro-esophageal reflux disease without esophagitis: Secondary | ICD-10-CM | POA: Diagnosis present

## 2019-09-19 DIAGNOSIS — Z79899 Other long term (current) drug therapy: Secondary | ICD-10-CM

## 2019-09-19 DIAGNOSIS — Z841 Family history of disorders of kidney and ureter: Secondary | ICD-10-CM

## 2019-09-19 DIAGNOSIS — M170 Bilateral primary osteoarthritis of knee: Secondary | ICD-10-CM | POA: Diagnosis present

## 2019-09-19 DIAGNOSIS — N840 Polyp of corpus uteri: Secondary | ICD-10-CM | POA: Diagnosis present

## 2019-09-19 DIAGNOSIS — Z8249 Family history of ischemic heart disease and other diseases of the circulatory system: Secondary | ICD-10-CM

## 2019-09-19 DIAGNOSIS — K5732 Diverticulitis of large intestine without perforation or abscess without bleeding: Principal | ICD-10-CM | POA: Diagnosis present

## 2019-09-19 DIAGNOSIS — Z20822 Contact with and (suspected) exposure to covid-19: Secondary | ICD-10-CM | POA: Diagnosis present

## 2019-09-19 LAB — COMPREHENSIVE METABOLIC PANEL
ALT: 12 U/L (ref 0–44)
AST: 14 U/L — ABNORMAL LOW (ref 15–41)
Albumin: 4.6 g/dL (ref 3.5–5.0)
Alkaline Phosphatase: 65 U/L (ref 38–126)
Anion gap: 10 (ref 5–15)
BUN: 10 mg/dL (ref 8–23)
CO2: 25 mmol/L (ref 22–32)
Calcium: 9.1 mg/dL (ref 8.9–10.3)
Chloride: 103 mmol/L (ref 98–111)
Creatinine, Ser: 0.8 mg/dL (ref 0.44–1.00)
GFR calc Af Amer: >60 mL/min (ref >60)
GFR calc non Af Amer: >60 mL/min (ref >60)
Glucose, Bld: 96 mg/dL (ref 70–99)
Potassium: 3.8 mmol/L (ref 3.5–5.1)
Sodium: 138 mmol/L (ref 135–145)
Total Bilirubin: 1.7 mg/dL — ABNORMAL HIGH (ref 0.3–1.2)
Total Protein: 8.2 g/dL — ABNORMAL HIGH (ref 6.5–8.1)

## 2019-09-19 LAB — URINALYSIS, ROUTINE W REFLEX MICROSCOPIC
Bacteria, UA: NONE SEEN
Bilirubin Urine: NEGATIVE
Glucose, UA: NEGATIVE mg/dL
Ketones, ur: 80 mg/dL — AB
Leukocytes,Ua: NEGATIVE
Nitrite: NEGATIVE
Protein, ur: NEGATIVE mg/dL
Specific Gravity, Urine: <=1.005 (ref 1.005–1.030)
pH: 6 (ref 5.0–8.0)

## 2019-09-19 LAB — LIPASE, BLOOD: Lipase: 18 U/L (ref 11–51)

## 2019-09-19 LAB — CBC
HCT: 42.6 % (ref 36.0–46.0)
Hemoglobin: 13.8 g/dL (ref 12.0–15.0)
MCH: 31 pg (ref 26.0–34.0)
MCHC: 32.4 g/dL (ref 30.0–36.0)
MCV: 95.7 fL (ref 80.0–100.0)
Platelets: 295 10*3/uL (ref 150–400)
RBC: 4.45 MIL/uL (ref 3.87–5.11)
RDW: 13.7 % (ref 11.5–15.5)
WBC: 16.7 10*3/uL — ABNORMAL HIGH (ref 4.0–10.5)
nRBC: 0 % (ref 0.0–0.2)

## 2019-09-19 LAB — SARS CORONAVIRUS 2 BY RT PCR (HOSPITAL ORDER, PERFORMED IN ~~LOC~~ HOSPITAL LAB): SARS Coronavirus 2: NEGATIVE

## 2019-09-19 MED ORDER — LIP MEDEX EX OINT
TOPICAL_OINTMENT | CUTANEOUS | Status: AC
  Filled 2019-09-19: qty 7

## 2019-09-19 MED ORDER — POLYETHYLENE GLYCOL 3350 17 G PO PACK: 17.0000 g | PACK | Freq: Every day | ORAL | Status: DC | PRN

## 2019-09-19 MED ORDER — LISINOPRIL 5 MG PO TABS
5.0000 mg | ORAL_TABLET | Freq: Every day | ORAL | Status: DC
  Administered 2019-09-19 – 2019-09-22 (×4): 5 mg via ORAL
  Filled 2019-09-19 (×4): qty 1

## 2019-09-19 MED ORDER — SODIUM CHLORIDE 0.9% FLUSH: 3.0000 mL | Freq: Once | INTRAVENOUS | Status: DC

## 2019-09-19 MED ORDER — ACETAMINOPHEN 650 MG RE SUPP: 650.0000 mg | Freq: Four times a day (QID) | RECTAL | Status: DC | PRN

## 2019-09-19 MED ORDER — CIPROFLOXACIN IN D5W 400 MG/200ML IV SOLN
400.0000 mg | Freq: Once | INTRAVENOUS | Status: AC
  Administered 2019-09-19: 400 mg via INTRAVENOUS
  Filled 2019-09-19: qty 200

## 2019-09-19 MED ORDER — ASPIRIN EC 81 MG PO TBEC
81.0000 mg | DELAYED_RELEASE_TABLET | Freq: Every day | ORAL | Status: DC
  Administered 2019-09-19 – 2019-09-22 (×4): 81 mg via ORAL
  Filled 2019-09-19 (×4): qty 1

## 2019-09-19 MED ORDER — LACTATED RINGERS IV SOLN: INTRAVENOUS | Status: DC

## 2019-09-19 MED ORDER — IOHEXOL 300 MG/ML  SOLN
100.0000 mL | Freq: Once | INTRAMUSCULAR | Status: AC | PRN
  Administered 2019-09-19: 100 mL via INTRAVENOUS

## 2019-09-19 MED ORDER — PROMETHAZINE HCL 25 MG PO TABS
12.5000 mg | ORAL_TABLET | Freq: Four times a day (QID) | ORAL | Status: DC | PRN
  Administered 2019-09-21: 12.5 mg via ORAL
  Filled 2019-09-19: qty 1

## 2019-09-19 MED ORDER — ONDANSETRON 4 MG PO TBDP
4.0000 mg | ORAL_TABLET | ORAL | Status: DC | PRN
  Administered 2019-09-20: 4 mg via ORAL
  Filled 2019-09-19: qty 1

## 2019-09-19 MED ORDER — ONDANSETRON HCL 4 MG/2ML IJ SOLN
4.0000 mg | Freq: Once | INTRAMUSCULAR | Status: AC
  Administered 2019-09-19: 4 mg via INTRAVENOUS
  Filled 2019-09-19: qty 2

## 2019-09-19 MED ORDER — FOLIC ACID 1 MG PO TABS
1.0000 mg | ORAL_TABLET | Freq: Every day | ORAL | Status: DC
  Administered 2019-09-19 – 2019-09-22 (×4): 1 mg via ORAL
  Filled 2019-09-19 (×4): qty 1

## 2019-09-19 MED ORDER — ADULT MULTIVITAMIN W/MINERALS CH
1.0000 | ORAL_TABLET | Freq: Every day | ORAL | Status: DC
  Administered 2019-09-19 – 2019-09-22 (×4): 1 via ORAL
  Filled 2019-09-19 (×4): qty 1

## 2019-09-19 MED ORDER — FIBER ADULT GUMMIES 2 G PO CHEW: 1.0000 | CHEWABLE_TABLET | Freq: Every day | ORAL | Status: DC

## 2019-09-19 MED ORDER — METRONIDAZOLE IN NACL 5-0.79 MG/ML-% IV SOLN
500.0000 mg | Freq: Once | INTRAVENOUS | Status: AC
  Administered 2019-09-19: 500 mg via INTRAVENOUS
  Filled 2019-09-19: qty 100

## 2019-09-19 MED ORDER — THIAMINE HCL 100 MG PO TABS
100.0000 mg | ORAL_TABLET | Freq: Every day | ORAL | Status: DC
  Administered 2019-09-19 – 2019-09-22 (×4): 100 mg via ORAL
  Filled 2019-09-19 (×4): qty 1

## 2019-09-19 MED ORDER — METOPROLOL TARTRATE 5 MG/5ML IV SOLN: 5.0000 mg | Freq: Four times a day (QID) | INTRAVENOUS | Status: DC | PRN

## 2019-09-19 MED ORDER — CIPROFLOXACIN IN D5W 400 MG/200ML IV SOLN
400.0000 mg | Freq: Two times a day (BID) | INTRAVENOUS | Status: DC
  Administered 2019-09-19 – 2019-09-22 (×6): 400 mg via INTRAVENOUS
  Filled 2019-09-19 (×6): qty 200

## 2019-09-19 MED ORDER — METRONIDAZOLE IN NACL 5-0.79 MG/ML-% IV SOLN
500.0000 mg | Freq: Three times a day (TID) | INTRAVENOUS | Status: DC
  Administered 2019-09-19 – 2019-09-22 (×9): 500 mg via INTRAVENOUS
  Filled 2019-09-19 (×9): qty 100

## 2019-09-19 MED ORDER — SODIUM CHLORIDE (PF) 0.9 % IJ SOLN
INTRAMUSCULAR | Status: AC
  Filled 2019-09-19: qty 50

## 2019-09-19 MED ORDER — SENNA 8.6 MG PO TABS
1.0000 | ORAL_TABLET | Freq: Two times a day (BID) | ORAL | Status: DC
  Administered 2019-09-19 – 2019-09-20 (×3): 8.6 mg via ORAL
  Filled 2019-09-19 (×3): qty 1

## 2019-09-19 MED ORDER — HYDROCODONE-ACETAMINOPHEN 5-325 MG PO TABS
1.0000 | ORAL_TABLET | ORAL | Status: DC | PRN
  Administered 2019-09-19 – 2019-09-20 (×3): 2 via ORAL
  Administered 2019-09-21 – 2019-09-22 (×3): 1 via ORAL
  Filled 2019-09-19: qty 2
  Filled 2019-09-19: qty 1
  Filled 2019-09-19 (×3): qty 2
  Filled 2019-09-19 (×2): qty 1

## 2019-09-19 MED ORDER — KETOROLAC TROMETHAMINE 30 MG/ML IJ SOLN
30.0000 mg | Freq: Four times a day (QID) | INTRAMUSCULAR | Status: DC | PRN
  Administered 2019-09-20 – 2019-09-21 (×5): 30 mg via INTRAVENOUS
  Filled 2019-09-19 (×5): qty 1

## 2019-09-19 MED ORDER — MORPHINE SULFATE (PF) 4 MG/ML IV SOLN
6.0000 mg | Freq: Once | INTRAVENOUS | Status: AC
  Administered 2019-09-19: 6 mg via INTRAVENOUS
  Filled 2019-09-19: qty 2

## 2019-09-19 MED ORDER — TRAZODONE HCL 50 MG PO TABS: 25.0000 mg | ORAL_TABLET | Freq: Every evening | ORAL | Status: DC | PRN

## 2019-09-19 MED ORDER — ACETAMINOPHEN 325 MG PO TABS
650.0000 mg | ORAL_TABLET | Freq: Four times a day (QID) | ORAL | Status: DC | PRN
  Administered 2019-09-20 – 2019-09-21 (×2): 650 mg via ORAL
  Filled 2019-09-19 (×2): qty 2

## 2019-09-19 MED ORDER — ENOXAPARIN SODIUM 40 MG/0.4ML ~~LOC~~ SOLN
40.0000 mg | SUBCUTANEOUS | Status: DC
  Administered 2019-09-19 – 2019-09-21 (×3): 40 mg via SUBCUTANEOUS
  Filled 2019-09-19 (×3): qty 0.4

## 2019-09-19 NOTE — ED Triage Notes (Addendum)
Pt developed abd pain yesterday, more noted in left side, seen here a couple of weeks ago for same. Reports fever that is reduced with OTC meds

## 2019-09-19 NOTE — ED Notes (Signed)
Patient ate 3 oz chicken noodle soup, drank 10 oz gingerale

## 2019-09-19 NOTE — ED Provider Notes (Signed)
[facility] DEPT Provider Note   CSN: [id number] Arrival date & time: 09/19/19  9767     History Chief Complaint  Patient presents with  . Abdominal Pain    Charlotte Garcia is a 64 y.o. female.  For similar28 year old female who presents with left lower quadrant abdominal pain.  Seen by myself about a month ago symptoms and diagnosed with acute uncomplicated diverticulitis.  She was placed on Augmentin but states her symptoms really did not improve.  She did not follow-up with her primary care doctor or GI doctor.  States that last night she had temp above 101 which she medicated with antipyretics.  She is had nausea but no vomiting.  No bloody stools.  Pain is sharp in left lower quadrant and persistent.  No urinary symptoms        Past Medical History:  Diagnosis Date  . Anemia   . Arthritis    Hands and generalized  . Chest discomfort 09/17/2017   per pt weight on chest:   cardiologist consult w/ dr berry 09-23-2017  normal nuclear stress test   . Diverticulosis   . Endometrial polyp   . Exposure to TB   . GERD (gastroesophageal reflux disease)   . Headache   . History of diverticulitis of colon    recurrent 2014; 2015; x2 in 2017  . History of ovarian cyst   . Hypertension   . Murmur, heart   . Tobacco abuse   . Uterine fibroid     Patient Active Problem List   Diagnosis Date Noted  . Family history of coronary artery disease in father 12/22/2017  . Endometrial polyp 10/06/2017    Class: Present on Admission  . Diverticulitis 11/06/2013  . Atypical chest pain 11/06/2013  . Hypokalemia 11/06/2013  . Acute diverticulitis 02/27/2013  . Essential hypertension   . Tobacco abuse     Past Surgical History:  Procedure Laterality Date  . CARDIOVASCULAR STRESS TEST  09-26-2017   dr berry   Low risk nuclear study w/ no ischemia/  normal LV function and wall motion ,  nuclear stress ef 56%  . COLONOSCOPY    . CYSTECTOMY      forehead; back of head  . DILATATION & CURETTAGE/HYSTEROSCOPY WITH MYOSURE N/A 10/06/2017   Procedure: DILATATION & CURETTAGE/HYSTEROSCOPY WITH MYOSURE;  Surgeon: Arvella Nigh, MD;  Location: [facility];  Service: Gynecology;  Laterality: N/A;  . DILATION AND CURETTAGE OF UTERUS    . TRANSTHORACIC ECHOCARDIOGRAM  09/08/2015   mild focal basal hypertrophy,  ef 60-65%/  trivial MR  . WRIST SURGERY     ganglion cyst     OB History    Gravida  3   Para  1   Term  1   Preterm      AB  2   Living  1     SAB  2   TAB      Ectopic      Multiple      Live Births              Family History  Problem Relation Age of Onset  . Renal Disease Brother   . Hypertension Father   . Heart disease Father        MI in his 35's  . Hypertension Mother   . Heart disease Mother     Social History   Tobacco Use  . Smoking status: Current Some Day Smoker  Packs/day: 0.25    Years: 25.00    Pack years: 6.25    Types: Cigarettes  . Smokeless tobacco: Never Used  Vaping Use  . Vaping Use: Never used  Substance Use Topics  . Alcohol use: Yes    Comment: 1-2 glasses of wine 3 times a week  . Drug use: Yes    Types: Marijuana    Comment: 3 weeks ago as of 09/17/2017    Home Medications Prior to Admission medications   Medication Sig Start Date End Date Taking? Authorizing Provider  amoxicillin-clavulanate (AUGMENTIN) 875-125 MG tablet Take 1 tablet by mouth every 12 (twelve) hours. 08/24/19   Lacretia Leigh, MD  aspirin EC 81 MG tablet Take 81 mg by mouth daily.     [provider]  dicyclomine (BENTYL) 20 MG tablet Take 1 tablet (20 mg total) by mouth 2 (two) times daily. Patient not taking: Reported on 08/24/2019 10/06/18   Jacqlyn Larsen, PA-C  diphenhydramine-acetaminophen (TYLENOL PM) 25-500 MG TABS tablet Take 2 tablets by mouth at bedtime as needed (pain).    [provider]  Fiber Adult Gummies 2 g CHEW Chew 1 tablet by mouth daily.     [provider]  lisinopril (PRINIVIL,ZESTRIL) 5 MG tablet Take 1 tablet (5 mg total) by mouth daily. Patient taking differently: Take 5 mg by mouth every evening.  09/23/17 12/22/17  Lorretta Harp, MD  lisinopril (ZESTRIL) 5 MG tablet Take 1 tablet (5 mg total) by mouth daily. 08/24/19   Lacretia Leigh, MD  Multiple Vitamins-Minerals (MULTIVITAMIN ADULT) CHEW Chew 1 tablet by mouth daily.    [provider]  ondansetron (ZOFRAN ODT) 4 MG disintegrating tablet 4mg  ODT q4 hours prn nausea/vomit Patient not taking: Reported on 08/24/2019 10/06/18   Jacqlyn Larsen, PA-C  oxyCODONE-acetaminophen (PERCOCET/ROXICET) 5-325 MG tablet Take 2 tablets by mouth every 4 (four) hours as needed for severe pain. 08/24/19   Lacretia Leigh, MD    Allergies    Sulfa antibiotics, Codeine, and Morphine and related  Review of Systems   Review of Systems  All other systems reviewed and are negative.   Physical Exam Updated Vital Signs BP (!) 139/92 (BP Location: Left Arm)   Pulse 88   Temp 98.4 F (36.9 C) (Oral)   Resp 17   Ht 1.549 m (5\' 1" )   Wt 56.7 kg   SpO2 98%   BMI 23.62 kg/m   Physical Exam Vitals and nursing note reviewed.  Constitutional:      General: She is not in acute distress.    Appearance: Normal appearance. She is well-developed. She is not toxic-appearing.  HENT:     Head: Normocephalic and atraumatic.  Eyes:     General: Lids are normal.     Conjunctiva/sclera: Conjunctivae normal.     Pupils: Pupils are equal, round, and reactive to light.  Neck:     Thyroid: No thyroid mass.     Trachea: No tracheal deviation.  Cardiovascular:     Rate and Rhythm: Normal rate and regular rhythm.     Heart sounds: Normal heart sounds. No murmur heard.  No gallop.   Pulmonary:     Effort: Pulmonary effort is normal. No respiratory distress.     Breath sounds: Normal breath sounds. No stridor. No decreased breath sounds, wheezing, rhonchi or rales.  Abdominal:      General: Bowel sounds are normal. There is no distension.     Palpations: Abdomen is soft.  Tenderness: There is abdominal tenderness in the left lower quadrant. There is guarding. There is no rebound.    Musculoskeletal:        General: No tenderness. Normal range of motion.     Cervical back: Normal range of motion and neck supple.  Skin:    General: Skin is warm and dry.     Findings: No abrasion or rash.  Neurological:     Mental Status: She is alert and oriented to person, place, and time.     GCS: GCS eye subscore is 4. GCS verbal subscore is 5. GCS motor subscore is 6.     Cranial Nerves: No cranial nerve deficit.     Sensory: No sensory deficit.  Psychiatric:        Speech: Speech normal.        Behavior: Behavior normal.     ED Results / Procedures / Treatments   Labs (all labs ordered are listed, but only abnormal results are displayed) Labs Reviewed  LIPASE, BLOOD  COMPREHENSIVE METABOLIC PANEL  CBC  URINALYSIS, ROUTINE W REFLEX MICROSCOPIC    EKG None  Radiology No results found.  Procedures Procedures (including critical care time)  Medications Ordered in ED Medications  sodium chloride flush (NS) 0.9 % injection 3 mL (0 mLs Intravenous Hold 09/19/19 1006)  morphine 4 MG/ML injection 6 mg (has no administration in time range)  ondansetron (ZOFRAN) injection 4 mg (has no administration in time range)    ED Course  I have reviewed the triage vital signs and the nursing notes.  Pertinent labs & imaging results that were available during my care of the patient were reviewed by me and considered in my medical decision making (see chart for details).    MDM Rules/Calculators/A&P                          Patient given 2 rounds of IV pain medication and still is uncomfortable.  Given IV antibiotics.  Will consult hospitalist for admission for observation for recurrent diverticulitis and likely GI consult Final Clinical Impression(s) / ED  Diagnoses Final diagnoses:  None    Rx / DC Orders ED Discharge Orders    None       Lacretia Leigh, MD 09/19/19 1357

## 2019-09-19 NOTE — Plan of Care (Signed)

## 2019-09-19 NOTE — ED Notes (Signed)
Patient c/o nausea, notified provider

## 2019-09-19 NOTE — H&P (Signed)
History and Physical    Charlotte Garcia:[id number] DOB: 07-14-1955 DOA: 09/19/2019  PCP: Patient, No Pcp Per  Patient coming from: Home  I have personally briefly reviewed patient's old medical records in [facility]  Chief Complaint: Left-sided abdominal pain  HPI: Charlotte Garcia is a 64 y.o. female with medical history significant of hypertension, chronic disc disease, arthritis in her knees seen approximately 1 month ago with diverticulitis.  The ED diagnosed her and according to her sent her home on 1 weeks worth of amoxicillin.  The pain improved somewhat but never really got better.  She is continue to avoid all normal triggers for her diverticulitis however her pain got significantly worse over the last 1 week.  She reports some nausea and decreased appetite but denies frank emesis.  Her bowels are not moving normally as she is constipated.  She has noted no blood in her stools.  She reports some mild dysuria.  Her pain is mostly cramping and left-sided and radiates to her back.  She was told at her ED visit follow-up with GI who is Dr. Michail Sermon.  She has not been able to see him.  She also has no current primary care provider. ED Course: In the ED she was noted to have a white count of 16.7.  CT showed pericolonic inflammation consistent with diverticulitis without evidence of abscess or perforation.  We are asked to admit for treatment of her diverticulitis.  Review of Systems: As per HPI otherwise 10 point review of systems negative.   Past Medical History:  Diagnosis Date  . Anemia   . Arthritis    Hands and generalized  . Chest discomfort 09/17/2017   per pt weight on chest:   cardiologist consult w/ dr berry 09-23-2017  normal nuclear stress test   . Diverticulosis   . Endometrial polyp   . Exposure to TB   . GERD (gastroesophageal reflux disease)   . Headache   . History of diverticulitis of colon    recurrent 2014; 2015; x2 in 2017  . History of ovarian  cyst   . Hypertension   . Murmur, heart   . Tobacco abuse   . Uterine fibroid     Past Surgical History:  Procedure Laterality Date  . CARDIOVASCULAR STRESS TEST  09-26-2017   dr berry   Low risk nuclear study w/ no ischemia/  normal LV function and wall motion ,  nuclear stress ef 56%  . COLONOSCOPY    . CYSTECTOMY     forehead; back of head  . DILATATION & CURETTAGE/HYSTEROSCOPY WITH MYOSURE N/A 10/06/2017   Procedure: DILATATION & CURETTAGE/HYSTEROSCOPY WITH MYOSURE;  Surgeon: Arvella Nigh, MD;  Location: [facility];  Service: Gynecology;  Laterality: N/A;  . DILATION AND CURETTAGE OF UTERUS    . TRANSTHORACIC ECHOCARDIOGRAM  09/08/2015   mild focal basal hypertrophy,  ef 60-65%/  trivial MR  . WRIST SURGERY     ganglion cyst     reports that she has been smoking cigarettes. She has a 6.25 pack-year smoking history. She has never used smokeless tobacco. She reports current alcohol use. She reports current drug use. Drug: Marijuana.  Allergies  Allergen Reactions  . Sulfa Antibiotics Hives  . Codeine Nausea And Vomiting  . Morphine And Related Nausea Only    Family History  Problem Relation Age of Onset  . Renal Disease Brother   . Hypertension Father   . Heart disease Father  MI in his 4's  . Hypertension Mother   . Heart disease Mother      Prior to Admission medications   Medication Sig Start Date End Date Taking? Authorizing Provider  aspirin EC 81 MG tablet Take 81 mg by mouth daily.    Yes [provider]  diphenhydramine-acetaminophen (TYLENOL PM) 25-500 MG TABS tablet Take 2 tablets by mouth at bedtime as needed (pain).   Yes [provider]  Fiber Adult Gummies 2 g CHEW Chew 1 tablet by mouth daily.   Yes [provider]  lisinopril (ZESTRIL) 5 MG tablet Take 1 tablet (5 mg total) by mouth daily. 08/24/19  Yes Lacretia Leigh, MD  Multiple Vitamins-Minerals (MULTIVITAMIN ADULT) CHEW Chew 1 tablet by mouth daily.    Yes [provider]  ondansetron (ZOFRAN ODT) 4 MG disintegrating tablet 4mg  ODT q4 hours prn nausea/vomit Patient taking differently: Take 4 mg by mouth every 4 (four) hours as needed for nausea or vomiting.  10/06/18  Yes Jacqlyn Larsen, [place]    Physical Exam: Vitals:   09/19/19 0934 09/19/19 0945 09/19/19 1309  BP: (!) 139/92  (!) 147/97  Pulse: 88  74  Resp: 17  18  Temp: 98.4 F (36.9 C)  98.3 F (36.8 C)  TempSrc: Oral  Oral  SpO2: 98%  99%  Weight:  56.7 kg   Height:  5\' 1"  (1.549 m)     Constitutional: NAD, calm, comfortable Eyes: PERRL, lids and conjunctivae normal ENMT: Mucous membranes are moist. Posterior pharynx clear of any exudate or lesions.Normal dentition.  Neck: normal, supple, no masses, no thyromegaly Respiratory: clear to auscultation bilaterally, no wheezing, no crackles. Normal respiratory effort. No accessory muscle use.  Cardiovascular: Regular rate and rhythm, no murmurs / rubs / gallops. No extremity edema. 2+ pedal pulses. No carotid bruits.  Abdomen: Positive tenderness on the left side, there is no rebound, there is no guarding, no masses palpated. No hepatosplenomegaly. Bowel sounds positive.  Musculoskeletal: no clubbing / cyanosis. No joint deformity upper and lower extremities. Good ROM, no contractures. Normal muscle tone.  Skin: no rashes, lesions, ulcers. No induration Neurologic: CN 2-12 grossly intact. Sensation intact, DTR normal. Strength 5/5 in all 4.  Psychiatric: Normal judgment and insight. Alert and oriented x 3. Normal mood.   Labs on Admission: I have personally reviewed following labs and imaging studies  CBC: Recent Labs  Lab 09/19/19 0947  WBC 16.7*  HGB 13.8  HCT 42.6  MCV 95.7  PLT 195   Basic Metabolic Panel: Recent Labs  Lab 09/19/19 0947  NA 138  K 3.8  CL 103  CO2 25  GLUCOSE 96  BUN 10  CREATININE 0.80  CALCIUM 9.1   GFR: Estimated Creatinine Clearance: 53.6 mL/min (by C-G formula based on  SCr of 0.8 mg/dL). Liver Function Tests: Recent Labs  Lab 09/19/19 0947  AST 14*  ALT 12  ALKPHOS 65  BILITOT 1.7*  PROT 8.2*  ALBUMIN 4.6   Recent Labs  Lab 09/19/19 0947  LIPASE 18   Urine analysis:    Component Value Date/Time   COLORURINE YELLOW (A) 09/19/2019 0947   APPEARANCEUR CLEAR 09/19/2019 0947   LABSPEC <=1.005 09/19/2019 [place] 6.0 09/19/2019 0947   GLUCOSEU NEGATIVE 09/19/2019 0947   HGBUR SMALL (A) 09/19/2019 0947   BILIRUBINUR NEGATIVE 09/19/2019 0947   KETONESUR 80 (A) 09/19/2019 0947   PROTEINUR NEGATIVE 09/19/2019 0947   UROBILINOGEN 1.0 11/06/2013 1803   NITRITE NEGATIVE 09/19/2019 0947  LEUKOCYTESUR NEGATIVE 09/19/2019 0947    Radiological Exams on Admission: CT Abdomen Pelvis W Contrast  Result Date: 09/19/2019 CLINICAL DATA:  Suspect diverticulitis. Abdominal pain beginning yesterday left-sided. EXAM: CT ABDOMEN AND PELVIS WITH CONTRAST TECHNIQUE: Multidetector CT imaging of the abdomen and pelvis was performed using the standard protocol following bolus administration of intravenous contrast. CONTRAST:  130mL OMNIPAQUE IOHEXOL 300 MG/ML  SOLN COMPARISON:  08/24/2019 FINDINGS: Lower chest: Lung bases are clear. Hepatobiliary: Gallbladder, liver and biliary tree are normal. Pancreas: Normal. Spleen: Small in somewhat misshapen but on changed from previous exams. Possible small hypodense mass present and unchanged likely benign. Adrenals/Urinary Tract: Adrenal glands are normal. Kidneys are normal in size without hydronephrosis or nephrolithiasis. Subcentimeter left lower pole renal cortical hypodensities too small to characterize but likely a cyst and unchanged. Ureters and bladder are normal. Stomach/Bowel: Stomach is normal. Small bowel is unremarkable. Appendix is normal. Diverticulosis of the colon. Interval development of pericolonic inflammation and minimal free fluid adjacent the mid to distal descending colon with associated wall thickening.  Findings are compatible with acute diverticulitis. There is no evidence of diverticular abscess or perforation. At that these findings are new compared to patient's recent previous exam. The prior exam from 08/24/2019 with equivocal for acute inflammation over the sigmoid colon. Vascular/Lymphatic: Minimal plaque over the aortic bifurcation as the aorta is normal in caliber. No significant adenopathy. Few small periaortic lymph nodes likely reactive. Reproductive: Normal. Other: Multiple pelvic phleboliths. Musculoskeletal: Moderate disc disease and endplate sclerosis at the T11-12 level unchanged. Prominent Schmorl's node over the superior endplate of L1 unchanged. IMPRESSION: 1. Interval development of acute diverticulitis involving a short segment of the mid to distal descending colon. No evidence of perforation or diverticular abscess. 2. Subcentimeter left renal cortical hypodensity too small to characterize but likely a cyst and unchanged. 3.  Aortic Atherosclerosis (ICD10-I70.0). Electronically Signed   By: Marin Olp M.D.   On: 09/19/2019 11:48    Assessment/Plan Principal Problem:   Acute diverticulitis Active Problems:   Essential hypertension   Tobacco abuse  Acute diverticulitis Bowel rest, clear liquid diet IV Cipro plus Flagyl Pain control  Essential hypertension Continue aspirin and lisinopril Metoprolol IV as needed hypertension  Tobacco abuse Offered and patient declined nicotine substitution.  Arthritis, multiple joints and back  DVT prophylaxis: Lovenox SQ Code Status: Full code  Family Communication: Patient at bedside Disposition Plan: Home Consults called: None Admission status: Observation, suspect she will get better in the next 24 to 48 hours and be ready for discharge on extended course of Cipro and Flagyl.   Donnamae Jude MD [facility]  If 7PM-7AM, please contact night-coverage 09/19/2019, 2:31 PM

## 2019-09-20 DIAGNOSIS — K5792 Diverticulitis of intestine, part unspecified, without perforation or abscess without bleeding: Secondary | ICD-10-CM

## 2019-09-20 DIAGNOSIS — K5732 Diverticulitis of large intestine without perforation or abscess without bleeding: Secondary | ICD-10-CM | POA: Diagnosis present

## 2019-09-20 LAB — BASIC METABOLIC PANEL
Anion gap: 8 (ref 5–15)
BUN: 6 mg/dL — ABNORMAL LOW (ref 8–23)
CO2: 27 mmol/L (ref 22–32)
Calcium: 9 mg/dL (ref 8.9–10.3)
Chloride: 103 mmol/L (ref 98–111)
Creatinine, Ser: 0.71 mg/dL (ref 0.44–1.00)
GFR calc Af Amer: >60 mL/min (ref >60)
GFR calc non Af Amer: >60 mL/min (ref >60)
Glucose, Bld: 103 mg/dL — ABNORMAL HIGH (ref 70–99)
Potassium: 3.1 mmol/L — ABNORMAL LOW (ref 3.5–5.1)
Sodium: 138 mmol/L (ref 135–145)

## 2019-09-20 LAB — HIV ANTIBODY (ROUTINE TESTING W REFLEX): HIV Screen 4th Generation wRfx: NONREACTIVE

## 2019-09-20 LAB — CBC
HCT: 36.3 % (ref 36.0–46.0)
Hemoglobin: 11.8 g/dL — ABNORMAL LOW (ref 12.0–15.0)
MCH: 31.4 pg (ref 26.0–34.0)
MCHC: 32.5 g/dL (ref 30.0–36.0)
MCV: 96.5 fL (ref 80.0–100.0)
Platelets: 279 10*3/uL (ref 150–400)
RBC: 3.76 MIL/uL — ABNORMAL LOW (ref 3.87–5.11)
RDW: 13.6 % (ref 11.5–15.5)
WBC: 12.1 10*3/uL — ABNORMAL HIGH (ref 4.0–10.5)
nRBC: 0 % (ref 0.0–0.2)

## 2019-09-20 MED ORDER — POTASSIUM CHLORIDE CRYS ER 20 MEQ PO TBCR
40.0000 meq | EXTENDED_RELEASE_TABLET | Freq: Once | ORAL | Status: AC
  Administered 2019-09-20: 40 meq via ORAL
  Filled 2019-09-20: qty 2

## 2019-09-20 MED ORDER — ONDANSETRON HCL 4 MG/2ML IJ SOLN
4.0000 mg | Freq: Four times a day (QID) | INTRAMUSCULAR | Status: DC | PRN
  Administered 2019-09-20 – 2019-09-22 (×3): 4 mg via INTRAVENOUS
  Filled 2019-09-20 (×3): qty 2

## 2019-09-20 MED ORDER — FLUCONAZOLE 100 MG PO TABS
100.0000 mg | ORAL_TABLET | Freq: Every day | ORAL | Status: DC
  Administered 2019-09-20 – 2019-09-22 (×3): 100 mg via ORAL
  Filled 2019-09-20 (×3): qty 1

## 2019-09-20 MED ORDER — PSYLLIUM 95 % PO PACK
1.0000 | PACK | Freq: Every day | ORAL | Status: DC
  Administered 2019-09-20 – 2019-09-21 (×2): 1 via ORAL
  Filled 2019-09-20 (×3): qty 1

## 2019-09-20 NOTE — Consult Note (Signed)
[facility] Gastroenterology Consult  Referring Provider: [facility] hospitalist/Dr.K.C. Primary Care Physician:  Patient, No Pcp Per Primary Gastroenterologist: Dr.Schooler  Reason for Consultation: Diverticulitis  HPI: Charlotte Garcia is a 64 y.o. female was admitted yesterday with complains of left-sided abdominal pain associated with fever of 102 F at home, nausea and vomiting.  She was in the ER on 08/24/2019 with similar complaints and was found to have acute uncomplicated sigmoid diverticulitis and was discharged on Augmentin, which she took for 5 days without any improvement.  Repeat CAT scan in the ER showed acute diverticulitis involving short segment in mid to distal descending with no evidence of perforation or abscess.  Patient's last colonoscopy was in 2018 and was found to have hyperplastic polyps. She has a brother who was diagnosed with colon cancer in his 12s. Patient feels that she has had multiple attacks of recurrent diverticulitis over the last 2 to 5 years. And she feels that this episode of diverticulitis was associated with intake of fresh corn.  She has about 7 pound weight loss in the last several weeks associated with abdominal pain and early satiety.  She also has intermittent dysphagia to solids as well as liquids but has never had an endoscopy. She complains of acid reflux as well as heartburn for which she takes Rolaids, has not been on a PPI. Normally she has multiple bowel movements a day, usually after meals and think she has irritable bowel syndrome, she has not noted any blood in stool or black stools.  She smokes about 2 cigarettes daily and drinks about 2 glasses of wine occasionally.   Past Medical History:  Diagnosis Date  . Anemia   . Arthritis    Hands and generalized  . Chest discomfort 09/17/2017   per pt weight on chest:   cardiologist consult w/ dr berry 09-23-2017  normal nuclear stress test   . Diverticulosis   . Endometrial polyp   . Exposure  to TB   . GERD (gastroesophageal reflux disease)   . Headache   . History of diverticulitis of colon    recurrent 2014; 2015; x2 in 2017  . History of ovarian cyst   . Hypertension   . Murmur, heart   . Tobacco abuse   . Uterine fibroid     Past Surgical History:  Procedure Laterality Date  . CARDIOVASCULAR STRESS TEST  09-26-2017   dr berry   Low risk nuclear study w/ no ischemia/  normal LV function and wall motion ,  nuclear stress ef 56%  . COLONOSCOPY    . CYSTECTOMY     forehead; back of head  . DILATATION & CURETTAGE/HYSTEROSCOPY WITH MYOSURE N/A 10/06/2017   Procedure: DILATATION & CURETTAGE/HYSTEROSCOPY WITH MYOSURE;  Surgeon: Arvella Nigh, MD;  Location: [facility];  Service: Gynecology;  Laterality: N/A;  . DILATION AND CURETTAGE OF UTERUS    . TRANSTHORACIC ECHOCARDIOGRAM  09/08/2015   mild focal basal hypertrophy,  ef 60-65%/  trivial MR  . WRIST SURGERY     ganglion cyst    Prior to Admission medications   Medication Sig Start Date End Date Taking? Authorizing Provider  aspirin EC 81 MG tablet Take 81 mg by mouth daily.    Yes [provider]  diphenhydramine-acetaminophen (TYLENOL PM) 25-500 MG TABS tablet Take 2 tablets by mouth at bedtime as needed (pain).   Yes [provider]  Fiber Adult Gummies 2 g CHEW Chew 1 tablet by mouth daily.   Yes [provider]  lisinopril (ZESTRIL) 5 MG tablet Take 1 tablet (5 mg total) by mouth daily. 08/24/19  Yes Lacretia Leigh, MD  Multiple Vitamins-Minerals (MULTIVITAMIN ADULT) CHEW Chew 1 tablet by mouth daily.   Yes [provider]  ondansetron (ZOFRAN ODT) 4 MG disintegrating tablet 4mg  ODT q4 hours prn nausea/vomit Patient taking differently: Take 4 mg by mouth every 4 (four) hours as needed for nausea or vomiting.  10/06/18  Yes Jacqlyn Larsen, PA-C    Current Facility-Administered Medications  Medication Dose Route Frequency Provider Last Rate Last Admin  .  acetaminophen (TYLENOL) tablet 650 mg  650 mg Oral Q6H PRN Donnamae Jude, MD       Or  . acetaminophen (TYLENOL) suppository 650 mg  650 mg Rectal Q6H PRN Donnamae Jude, MD      . aspirin EC tablet 81 mg  81 mg Oral Daily Donnamae Jude, MD   81 mg at 09/20/19 0854  . ciprofloxacin (CIPRO) IVPB 400 mg  400 mg Intravenous Q12H Donnamae Jude, MD 200 mL/hr at 09/20/19 0129 400 mg at 09/20/19 0129  . enoxaparin (LOVENOX) injection 40 mg  40 mg Subcutaneous Q24H Donnamae Jude, MD   40 mg at 09/19/19 1825  . folic acid (FOLVITE) tablet 1 mg  1 mg Oral Daily Donnamae Jude, MD   1 mg at 09/20/19 0853  . HYDROcodone-acetaminophen (NORCO/VICODIN) 5-325 MG per tablet 1-2 tablet  1-2 tablet Oral Q4H PRN Donnamae Jude, MD   2 tablet at 09/20/19 0129  . ketorolac (TORADOL) 30 MG/ML injection 30 mg  30 mg Intravenous Q6H PRN Donnamae Jude, MD   30 mg at 09/20/19 0855  . lactated ringers infusion   Intravenous Continuous Donnamae Jude, MD 100 mL/hr at 09/20/19 0530 New Bag at 09/20/19 0530  . lisinopril (ZESTRIL) tablet 5 mg  5 mg Oral Daily Donnamae Jude, MD   5 mg at 09/20/19 0854  . metoprolol tartrate (LOPRESSOR) injection 5 mg  5 mg Intravenous Q6H PRN Donnamae Jude, MD      . metroNIDAZOLE (FLAGYL) IVPB 500 mg  500 mg Intravenous Q8H Donnamae Jude, MD 100 mL/hr at 09/20/19 0531 500 mg at 09/20/19 0531  . multivitamin with minerals tablet 1 tablet  1 tablet Oral Daily Donnamae Jude, MD   1 tablet at 09/20/19 [phone]  . ondansetron (ZOFRAN-ODT) disintegrating tablet 4 mg  4 mg Oral Q4H PRN Donnamae Jude, MD   4 mg at 09/20/19 0545  . polyethylene glycol (MIRALAX / GLYCOLAX) packet 17 g  17 g Oral Daily PRN Donnamae Jude, MD      . promethazine (PHENERGAN) tablet 12.5 mg  12.5 mg Oral Q6H PRN Donnamae Jude, MD      . senna (SENOKOT) tablet 8.6 mg  1 tablet Oral BID Donnamae Jude, MD   8.6 mg at 09/20/19 0853  . thiamine tablet 100 mg  100 mg Oral Daily Donnamae Jude, MD   100 mg at 09/20/19 0854   . traZODone (DESYREL) tablet 25 mg  25 mg Oral QHS PRN Donnamae Jude, MD        Allergies as of 09/19/2019 - Review Complete 09/19/2019  Allergen Reaction Noted  . Sulfa antibiotics Hives 01/27/2011  . Codeine Nausea And Vomiting 05/27/2013  . Morphine and related Nausea Only 03/30/2015    Family History  Problem Relation Age of Onset  . Renal Disease Brother   .  Hypertension Father   . Heart disease Father        MI in his 45's  . Hypertension Mother   . Heart disease Mother     Social History   Socioeconomic History  . Marital status: Divorced    Spouse name: Not on file  . Number of children: Not on file  . Years of education: Not on file  . Highest education level: Not on file  Occupational History  . Not on file  Tobacco Use  . Smoking status: Current Some Day Smoker    Packs/day: 0.25    Years: 25.00    Pack years: 6.25    Types: Cigarettes  . Smokeless tobacco: Never Used  Vaping Use  . Vaping Use: Never used  Substance and Sexual Activity  . Alcohol use: Yes    Comment: 1-2 glasses of wine 3 times a week  . Drug use: Yes    Types: Marijuana    Comment: 3 weeks ago as of 09/17/2017  . Sexual activity: Yes    Birth control/protection: Post-menopausal  Other Topics Concern  . Not on file  Social History Narrative  . Not on file   Social Determinants of Health   Financial Resource Strain:   . Difficulty of Paying Living Expenses:   Food Insecurity:   . Worried About Charity fundraiser in the Last Year:   . Arboriculturist in the Last Year:   Transportation Needs:   . Film/video editor (Medical):   Marland Kitchen Lack of Transportation (Non-Medical):   Physical Activity:   . Days of Exercise per Week:   . Minutes of Exercise per Session:   Stress:   . Feeling of Stress :   Social Connections:   . Frequency of Communication with Friends and Family:   . Frequency of Social Gatherings with Friends and Family:   . Attends Religious Services:   . Active  Member of Clubs or Organizations:   . Attends Archivist Meetings:   Marland Kitchen Marital Status:   Intimate Partner Violence:   . Fear of Current or Ex-Partner:   . Emotionally Abused:   Marland Kitchen Physically Abused:   . Sexually Abused:     Review of Systems: Positive for: GI: Described in detail in HPI.    Gen:  involuntary weight loss, Denies any fever, chills, rigors, night sweats, anorexia, fatigue, weakness, malaise,and sleep disorder CV: Denies chest pain, angina, palpitations, syncope, orthopnea, PND, peripheral edema, and claudication. Resp: Denies dyspnea, cough, sputum, wheezing, coughing up blood. GU : Denies urinary burning, blood in urine, urinary frequency, urinary hesitancy, nocturnal urination, and urinary incontinence. MS: Denies joint pain or swelling.  Denies muscle weakness, cramps, atrophy.  Derm: Denies rash, itching, oral ulcerations, hives, unhealing ulcers.  Psych: Denies depression, anxiety, memory loss, suicidal ideation, hallucinations,  and confusion. Heme: Denies bruising, bleeding, and enlarged lymph nodes. Neuro:  Denies any headaches, dizziness, paresthesias. Endo:  Denies any problems with DM, thyroid, adrenal function.  Physical Exam: Vital signs in last 24 hours: Temp:  [97.8 F (36.6 C)-98.7 F (37.1 C)] 98.7 F (37.1 C) (07/05 1250) Pulse Rate:  [53-81] 71 (07/05 1250) Resp:  [16-18] 18 (07/05 1250) BP: (131-174)/(76-97) 174/89 (07/05 1250) SpO2:  [96 %-100 %] 100 % (07/05 1250) Last BM Date: 09/18/19  General:   Alert,  Well-developed, well-nourished, pleasant and cooperative in NAD Head:  Normocephalic and atraumatic. Eyes:  Sclera clear, no icterus.   Conjunctiva pink. Ears:  Normal  auditory acuity. Nose:  No deformity, discharge,  or lesions. Mouth:  No deformity or lesions.  Oropharynx pink & moist. Neck:  Supple; no masses or thyromegaly. Lungs:  Clear throughout to auscultation.   No wheezes, crackles, or rhonchi. No acute  distress. Heart:  Regular rate and rhythm; no murmurs, clicks, rubs,  or gallops. Extremities:  Without clubbing or edema. Neurologic:  Alert and  oriented x4;  grossly normal neurologically. Skin:  Intact without significant lesions or rashes. Psych:  Alert and cooperative. Normal mood and affect. Abdomen:  Soft, mild left lower quadrant tenderness and nondistended. No masses, hepatosplenomegaly or hernias noted.  Hyperactive bowel sounds, without guarding, and without rebound.         Lab Results: Recent Labs    09/19/19 0947 09/20/19 0546  WBC 16.7* 12.1*  HGB 13.8 11.8*  HCT 42.6 36.3  PLT 295 279   BMET Recent Labs    09/19/19 0947 09/20/19 0546  NA 138 138  K 3.8 3.1*  CL 103 103  CO2 25 27  GLUCOSE 96 103*  BUN 10 6*  CREATININE 0.80 0.71  CALCIUM 9.1 9.0   LFT Recent Labs    09/19/19 0947  PROT 8.2*  ALBUMIN 4.6  AST 14*  ALT 12  ALKPHOS 65  BILITOT 1.7*   PT/INR No results for input(s): LABPROT, INR in the last 72 hours.  Studies/Results: CT Abdomen Pelvis W Contrast  Result Date: 09/19/2019 CLINICAL DATA:  Suspect diverticulitis. Abdominal pain beginning yesterday left-sided. EXAM: CT ABDOMEN AND PELVIS WITH CONTRAST TECHNIQUE: Multidetector CT imaging of the abdomen and pelvis was performed using the standard protocol following bolus administration of intravenous contrast. CONTRAST:  155mL OMNIPAQUE IOHEXOL 300 MG/ML  SOLN COMPARISON:  08/24/2019 FINDINGS: Lower chest: Lung bases are clear. Hepatobiliary: Gallbladder, liver and biliary tree are normal. Pancreas: Normal. Spleen: Small in somewhat misshapen but on changed from previous exams. Possible small hypodense mass present and unchanged likely benign. Adrenals/Urinary Tract: Adrenal glands are normal. Kidneys are normal in size without hydronephrosis or nephrolithiasis. Subcentimeter left lower pole renal cortical hypodensities too small to characterize but likely a cyst and unchanged. Ureters and  bladder are normal. Stomach/Bowel: Stomach is normal. Small bowel is unremarkable. Appendix is normal. Diverticulosis of the colon. Interval development of pericolonic inflammation and minimal free fluid adjacent the mid to distal descending colon with associated wall thickening. Findings are compatible with acute diverticulitis. There is no evidence of diverticular abscess or perforation. At that these findings are new compared to patient's recent previous exam. The prior exam from 08/24/2019 with equivocal for acute inflammation over the sigmoid colon. Vascular/Lymphatic: Minimal plaque over the aortic bifurcation as the aorta is normal in caliber. No significant adenopathy. Few small periaortic lymph nodes likely reactive. Reproductive: Normal. Other: Multiple pelvic phleboliths. Musculoskeletal: Moderate disc disease and endplate sclerosis at the T11-12 level unchanged. Prominent Schmorl's node over the superior endplate of L1 unchanged. IMPRESSION: 1. Interval development of acute diverticulitis involving a short segment of the mid to distal descending colon. No evidence of perforation or diverticular abscess. 2. Subcentimeter left renal cortical hypodensity too small to characterize but likely a cyst and unchanged. 3.  Aortic Atherosclerosis (ICD10-I70.0). Electronically Signed   By: Marin Olp M.D.   On: 09/19/2019 11:48    Impression: Acute diverticulitis of mid to distal descending colon without complication Improving leukocytosis from 16.7-12.1 Mild normocytic anemia Hypokalemia  Intermittent dysphagia and acid reflux  Plan: Patient is requesting for diet to be advanced,  okay to start on full liquid diet. Continue IV ciprofloxacin and IV Flagyl along with lactated Ringer's at 100 cc an hour DC MiraLAX and start Metamucil once a day. As an outpatient patient will benefit from use of bulk forming laxative such as Metamucil/Benefiber/psyllium husk.  She will also need a repeat colonoscopy  in 2023, because of family history of colon cancer in her brother, last colonoscopy in 2018. Patient will also benefit from outpatient endoscopy for history of dysphagia.  We will follow.   LOS: 0 days   Ronnette Juniper, MD  09/20/2019, 12:52 PM

## 2019-09-20 NOTE — Progress Notes (Addendum)
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 Patient seen in the ED work-up shows leukocytosis 16.7 CT showed pericolonic inflammation consistent with diverticulitis without evidence of abscess or perforation. Patient was admitted due to failure of outpatient treatment  Subjective:  Complains left-sided abdominal pain and difficulty with oral intake. Abdomen pain somewhat better compared to yesterday. Patient afebrile overnight. Leukocytosis downtrending. K at 3.1.  Assessment & Plan:  Acute diverticulitis involving a short segment of the mid to distal descending colon without perforation or abscess:Treated with oral antibiotics on outpatient basis with ongoing symptoms.  Admitted with failure of outpatient oral therapy.First episode 5 yrs ago- had episode once a year. Was suggested that she may need surgery- but not interested in surgery at this time.  She reports she sees Dr Michail Sermon w/ [facility], last c-scope 62yrs ago-had 3 polyps removed.  Will ask GI to see him given his recurrent issues.  We will keep on clear liquid diet, IV Cipro Flagyl, repeat CBC in a.m.  Hypokalemia: will replete orally  Essential hypertension: BP well controlled on lisinopril. On home aspirin.  Tobacco abuse-follow-up outpatient, cessation advised   DVT prophylaxis: enoxaparin (LOVENOX) injection 40 mg Start: 09/19/19 1800 Code Status: Code Family Communication: plan of care discussed with patient at bedside.  Status is: Admitted under observation  Patient remains hospitalized for ongoing need of IV  antibiotics due to poor p.o. intake, ongoing abdominal pain, failure of outpatient oral antibiotics.  Dispo: The patient is from: Home              Anticipated d/c is to: Home              Anticipated d/c date is: 2 days              Patient currently is not medically stable to d/c.        Nutrition: Diet Order            Diet clear liquid Room service appropriate? Yes; Fluid consistency: Thin  Diet effective now                       Body mass index is 23.62 kg/m.  Consultants:see note  Procedures:see note Microbiology:see note  Medications: Scheduled Meds: . aspirin EC  81 mg Oral Daily  . enoxaparin (LOVENOX) injection  40 mg Subcutaneous Q24H  . folic acid  1 mg Oral Daily  . lisinopril  5 mg Oral Daily  . multivitamin with minerals  1 tablet Oral Daily  . senna  1 tablet Oral BID  . thiamine  100 mg Oral Daily   Continuous Infusions: . ciprofloxacin 400 mg (09/20/19 0129)  . lactated ringers 100 mL/hr at 09/20/19 0530  . metronidazole 500 mg (09/20/19 0531)    Antimicrobials: Anti-infectives (From admission, onward)   Start     Dose/Rate Route Frequency Ordered Stop   09/19/19 1515  ciprofloxacin (CIPRO) IVPB 400 mg     Discontinue     400 mg 200 mL/hr over 60 Minutes Intravenous Every 12 hours 09/19/19 1501     09/19/19 1515  metroNIDAZOLE (FLAGYL)  IVPB 500 mg     Discontinue     500 mg 100 mL/hr over 60 Minutes Intravenous Every 8 hours 09/19/19 1501     09/19/19 1230  ciprofloxacin (CIPRO) IVPB 400 mg        400 mg 200 mL/hr over 60 Minutes Intravenous  Once 09/19/19 1228 09/19/19 1532   09/19/19 1230  metroNIDAZOLE (FLAGYL) IVPB 500 mg        500 mg 100 mL/hr over 60 Minutes Intravenous  Once 09/19/19 1228 09/19/19 1430       Objective: Vitals: Today's Vitals   09/20/19 0149 09/20/19 0229 09/20/19 0532 09/20/19 0903  BP: 138/89  132/86   Pulse: 67  (!) 53   Resp: 16  16   Temp: 97.9 F (36.6 C)  97.8 F (36.6 C)   TempSrc: Oral   Oral   SpO2: 99%  97%   Weight:      Height:      PainSc:  Asleep  8     Intake/Output Summary (Last 24 hours) at 09/20/2019 1141 Last data filed at 09/20/2019 0858 Gross per 24 hour  Intake 2355.14 ml  Output 600 ml  Net 1755.14 ml   Filed Weights   09/19/19 0945  Weight: 56.7 kg   Weight change:    Intake/Output from previous day: 07/04 0701 - 07/05 0700 In: 2235.1 [P.O.:420; I.V.:899.5; IV Piggyback:915.6] Out: 400 [Urine:400] Intake/Output this shift: Total I/O In: 120 [P.O.:120] Out: 200 [Urine:200]  Examination:  General exam: AAO ,NAD, weak appearing. HEENT:Oral mucosa moist, Ear/Nose WNL grossly,dentition normal. Respiratory system: bilaterally clear,no wheezing or crackles,no use of accessory muscle, non tender. Cardiovascular system: S1 & S2 +, regular, No JVD. Gastrointestinal system: Abdomen soft, tender left quadrant,ND, BS+. Nervous System:Alert, awake, moving extremities and grossly nonfocal Extremities: No edema, distal peripheral pulses palpable.  Skin: No rashes,no icterus. MSK: Normal muscle bulk,tone, power  Data Reviewed: I have personally reviewed following labs and imaging studies CBC: Recent Labs  Lab 09/19/19 0947 09/20/19 0546  WBC 16.7* 12.1*  HGB 13.8 11.8*  HCT 42.6 36.3  MCV 95.7 96.5  PLT 295 354   Basic Metabolic Panel: Recent Labs  Lab 09/19/19 0947 09/20/19 0546  NA 138 138  K 3.8 3.1*  CL 103 103  CO2 25 27  GLUCOSE 96 103*  BUN 10 6*  CREATININE 0.80 0.71  CALCIUM 9.1 9.0   GFR: Estimated Creatinine Clearance: 53.6 mL/min (by C-G formula based on SCr of 0.71 mg/dL). Liver Function Tests: Recent Labs  Lab 09/19/19 0947  AST 14*  ALT 12  ALKPHOS 65  BILITOT 1.7*  PROT 8.2*  ALBUMIN 4.6   Recent Labs  Lab 09/19/19 0947  LIPASE 18   No results for input(s): AMMONIA in the last 168 hours. Coagulation Profile: No results for input(s): INR, PROTIME in the last 168 hours. Cardiac Enzymes: No results for  input(s): CKTOTAL, CKMB, CKMBINDEX, TROPONINI in the last 168 hours. BNP (last 3 results) No results for input(s): PROBNP in the last 8760 hours. HbA1C: No results for input(s): HGBA1C in the last 72 hours. CBG: No results for input(s): GLUCAP in the last 168 hours. Lipid Profile: No results for input(s): CHOL, HDL, LDLCALC, TRIG, CHOLHDL, LDLDIRECT in the last 72 hours. Thyroid Function Tests: No results for input(s): TSH, T4TOTAL, FREET4, T3FREE, THYROIDAB in the last 72 hours. Anemia Panel: No results for input(s): VITAMINB12, FOLATE, FERRITIN, TIBC, IRON, RETICCTPCT in the last 72 hours. Sepsis Labs: No results for input(s): PROCALCITON,  LATICACIDVEN in the last 168 hours.  Recent Results (from the past 240 hour(s))  SARS Coronavirus 2 by RT PCR (hospital order, performed in [facility] hospital lab) Nasopharyngeal Nasopharyngeal Swab     Status: None   Collection Time: 09/19/19  2:33 PM   Specimen: Nasopharyngeal Swab  Result Value Ref Range Status   SARS Coronavirus 2 NEGATIVE NEGATIVE Final    Comment: (NOTE) SARS-CoV-2 target nucleic acids are NOT DETECTED.  The SARS-CoV-2 RNA is generally detectable in upper and lower respiratory specimens during the acute phase of infection. The lowest concentration of SARS-CoV-2 viral copies this assay can detect is 250 copies / mL. A negative result does not preclude SARS-CoV-2 infection and should not be used as the sole basis for treatment or other patient management decisions.  A negative result may occur with improper specimen collection / handling, submission of specimen other than nasopharyngeal swab, presence of viral mutation(s) within the areas targeted by this assay, and inadequate number of viral copies (<250 copies / mL). A negative result must be combined with clinical observations, patient history, and epidemiological information.  Fact Sheet for Patients:   StrictlyIdeas.no  Fact Sheet for  Healthcare Providers: BankingDealers.co.za  This test is not yet approved or  cleared by the [place] FDA and has been authorized for detection and/or diagnosis of SARS-CoV-2 by FDA under an Emergency Use Authorization (EUA).  This EUA will remain in effect (meaning this test can be used) for the duration of the COVID-19 declaration under Section 564(b)(1) of the Act, 21 U.S.C. section 360bbb-3(b)(1), unless the authorization is terminated or revoked sooner.  Performed at [facility], [place] [address], [place], [place] [address]       Radiology Studies: CT Abdomen Pelvis W Contrast  Result Date: 09/19/2019 CLINICAL DATA:  Suspect diverticulitis. Abdominal pain beginning yesterday left-sided. EXAM: CT ABDOMEN AND PELVIS WITH CONTRAST TECHNIQUE: Multidetector CT imaging of the abdomen and pelvis was performed using the standard protocol following bolus administration of intravenous contrast. CONTRAST:  13mL OMNIPAQUE IOHEXOL 300 MG/ML  SOLN COMPARISON:  08/24/2019 FINDINGS: Lower chest: Lung bases are clear. Hepatobiliary: Gallbladder, liver and biliary tree are normal. Pancreas: Normal. Spleen: Small in somewhat misshapen but on changed from previous exams. Possible small hypodense mass present and unchanged likely benign. Adrenals/Urinary Tract: Adrenal glands are normal. Kidneys are normal in size without hydronephrosis or nephrolithiasis. Subcentimeter left lower pole renal cortical hypodensities too small to characterize but likely a cyst and unchanged. Ureters and bladder are normal. Stomach/Bowel: Stomach is normal. Small bowel is unremarkable. Appendix is normal. Diverticulosis of the colon. Interval development of pericolonic inflammation and minimal free fluid adjacent the mid to distal descending colon with associated wall thickening. Findings are compatible with acute diverticulitis. There is no evidence of diverticular abscess or perforation.  At that these findings are new compared to patient's recent previous exam. The prior exam from 08/24/2019 with equivocal for acute inflammation over the sigmoid colon. Vascular/Lymphatic: Minimal plaque over the aortic bifurcation as the aorta is normal in caliber. No significant adenopathy. Few small periaortic lymph nodes likely reactive. Reproductive: Normal. Other: Multiple pelvic phleboliths. Musculoskeletal: Moderate disc disease and endplate sclerosis at the T11-12 level unchanged. Prominent Schmorl's node over the superior endplate of L1 unchanged. IMPRESSION: 1. Interval development of acute diverticulitis involving a short segment of the mid to distal descending colon. No evidence of perforation or diverticular abscess. 2. Subcentimeter left renal cortical hypodensity too small to characterize but likely a cyst  and unchanged. 3.  Aortic Atherosclerosis (ICD10-I70.0). Electronically Signed   By: Marin Olp M.D.   On: 09/19/2019 11:48     LOS: 0 days   Antonieta Pert, MD [facility]  09/20/2019, 11:41 AM

## 2019-09-20 NOTE — TOC Initial Note (Addendum)
Transition of Care [facility]) - Initial/Assessment Note    Patient Details  Name: Charlotte Garcia MRN: [id number] Date of Birth: 04-02-1955  [facility] CM/SW Contact:    [name], [place] Phone Number: 09/20/2019, 3:16 PM  Clinical Narrative:                 CSW met with the patient at bedside. Patient from home, lives alone and is independent. Patient reports she does not currently have a primary care physician. She is agreeable for CSW to arrange a follow up appointment. Patient is requesting to go to the [facility] since it is close to her home and she does not have far to travel. Patient reports she does not have an issue affording her medications. She uses her social security benefits each month to help pay for her medications. She uses the [employer] discount as well. Patient will qualify for Medicare insurance in 5 months. Patient declines using a RW. No DME needs identified.   CSW unable to make appointment at [facility] today due to the observance of the [place]. CSW will make referral tomorrow.    Expected Discharge Plan: Home/Self Care Barriers to Discharge: No Barriers Identified   Patient Goals and CMS Choice     Choice offered to / list presented to : NA  Expected Discharge Plan and Services Expected Discharge Plan: Home/Self Care   Discharge Planning Services: Follow-up appt scheduled Post Acute Care Choice: NA Living arrangements for the past 2 months: Apartment                 DME Arranged: N/A DME Agency: NA         HH Agency: NA        Prior Living Arrangements/Services Living arrangements for the past 2 months: Apartment Lives with:: Self Patient language and need for interpreter reviewed:: No Do you feel safe going back to the place where you live?: Yes      Need for Family Participation in Patient Care: Yes (Comment) Care giver support system in place?: Yes (comment)   Criminal Activity/Legal Involvement Pertinent  to Current Situation/Hospitalization: No - Comment as needed  Activities of Daily Living Home Assistive Devices/Equipment: None ADL Screening (condition at time of admission) Patient's cognitive ability adequate to safely complete daily activities?: Yes Is the patient deaf or have difficulty hearing?: No Does the patient have difficulty seeing, even when wearing glasses/contacts?: No Does the patient have difficulty concentrating, remembering, or making decisions?: No Patient able to express need for assistance with ADLs?: Yes Does the patient have difficulty dressing or bathing?: No Independently performs ADLs?: Yes (appropriate for developmental age) Does the patient have difficulty walking or climbing stairs?: No Weakness of Legs: None Weakness of Arms/Hands: None  Permission Sought/Granted Permission sought to share information with : Other (comment) Permission granted to share information with : Yes, Verbal Permission Granted     Permission granted to share info w AGENC[facility]: [facility]        Emotional Assessment Appearance:: Appears stated age Attitude/Demeanor/Rapport: Engaged Affect (typically observed): Accepting, Pleasant Orientation: : Oriented to Self, Oriented to Place, Oriented to  Time, Oriented to Situation Alcohol / Substance Use: Not Applicable Psych Involvement: No (comment)  Admission diagnosis:  Diverticulitis [K57.92] Diverticulitis large intestine w/o perforation or abscess w/o bleeding [K57.32] Patient Active Problem List   Diagnosis Date Noted  . Diverticulitis large intestine w/o perforation or abscess w/o bleeding 09/19/2019  . Family  history of coronary artery disease in father 12/22/2017  . Endometrial polyp 10/06/2017    Class: Present on Admission  . Atypical chest pain 11/06/2013  . Hypokalemia 11/06/2013  . Acute diverticulitis 02/27/2013  . Essential hypertension   . Tobacco abuse    PCP:  Patient, No Pcp Per Pharmacy:   [facility] [address] [place] - [address] [place] [place] [address] Phone: [phone] Fax: [phone]     Social Determinants of Health (SDOH) Interventions    Readmission Risk Interventions No flowsheet data found.

## 2019-09-21 LAB — CBC
HCT: 37.9 % (ref 36.0–46.0)
Hemoglobin: 12.3 g/dL (ref 12.0–15.0)
MCH: 31.1 pg (ref 26.0–34.0)
MCHC: 32.5 g/dL (ref 30.0–36.0)
MCV: 95.7 fL (ref 80.0–100.0)
Platelets: 265 10*3/uL (ref 150–400)
RBC: 3.96 MIL/uL (ref 3.87–5.11)
RDW: 13.5 % (ref 11.5–15.5)
WBC: 7.9 10*3/uL (ref 4.0–10.5)
nRBC: 0 % (ref 0.0–0.2)

## 2019-09-21 LAB — BASIC METABOLIC PANEL
Anion gap: 8 (ref 5–15)
BUN: <5 mg/dL — ABNORMAL LOW (ref 8–23)
CO2: 25 mmol/L (ref 22–32)
Calcium: 9 mg/dL (ref 8.9–10.3)
Chloride: 105 mmol/L (ref 98–111)
Creatinine, Ser: 0.53 mg/dL (ref 0.44–1.00)
GFR calc Af Amer: >60 mL/min (ref >60)
GFR calc non Af Amer: >60 mL/min (ref >60)
Glucose, Bld: 95 mg/dL (ref 70–99)
Potassium: 3.8 mmol/L (ref 3.5–5.1)
Sodium: 138 mmol/L (ref 135–145)

## 2019-09-21 MED ORDER — PANTOPRAZOLE SODIUM 40 MG PO TBEC
40.0000 mg | DELAYED_RELEASE_TABLET | Freq: Every day | ORAL | Status: DC
  Administered 2019-09-21: 40 mg via ORAL
  Filled 2019-09-21: qty 1

## 2019-09-21 MED ORDER — PANTOPRAZOLE SODIUM 40 MG PO TBEC
40.0000 mg | DELAYED_RELEASE_TABLET | Freq: Two times a day (BID) | ORAL | Status: DC
  Administered 2019-09-21 – 2019-09-22 (×2): 40 mg via ORAL
  Filled 2019-09-21 (×2): qty 1

## 2019-09-21 NOTE — Progress Notes (Addendum)
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 Patient seen in the ED work-up shows leukocytosis 16.7 CT showed pericolonic inflammation consistent with diverticulitis without evidence of abscess or perforation. Patient was admitted due to failure of outpatient treatment  Subjective:  Was feeling better until she took tylenol and c/o nausea, upset stomach. Wants to try some diet to see if that makes her feel better today. Overall feeling worse today.    Assessment & Plan:  Acute diverticulitis involving a short segment of the mid to distal descending colon without perforation or abscess:Treated with oral antibiotics on outpatient basis with ongoing symptoms.  Admitted with failure of outpatient oral therapy. Recurrent episodes in past 2-5 years. Seen by Dr Therisa Doyne- needs Colo in 2023, Cont FLD and ADAT soft diet today complaining of stomach upset indigestion, add PPI, continue, Cont iv cipro/flagly.  We will do soft diet trial today.   Hypokalemia: resolved  Essential hypertension: Controlled on home lisinopril. Tobacco abuse-follow-up outpatient, cessation advised  DVT prophylaxis: enoxaparin (LOVENOX) injection 40 mg Start: 09/19/19 1800 Code Status: Code Family Communication: plan of care discussed with patient at bedside.  Status is: Admitted under observation  Patient remains hospitalized for ongoing need of IV antibiotics due to poor p.o. intake, ongoing abdominal pain, failure of outpatient oral antibiotics.  Dispo: The patient is from: Home              Anticipated d/c is to:  Home              Anticipated d/c date is:1 day              Patient currently is not medically stable to d/c.  Discharge home once able to tolerate soft diet and once abdominal pain is improved.  Nutrition: Diet Order            Diet full liquid Room service appropriate? Yes; Fluid consistency: Thin  Diet effective now                 Body mass index is 23.62 kg/m.  Consultants: GI  Procedures:see note Microbiology:see note  Medications: Scheduled Meds: . aspirin EC  81 mg Oral Daily  . enoxaparin (LOVENOX) injection  40 mg Subcutaneous Q24H  . fluconazole  100 mg Oral Daily  . folic acid  1 mg Oral Daily  . lisinopril  5 mg Oral Daily  . multivitamin with minerals  1 tablet Oral Daily  . psyllium  1 packet Oral Daily  . thiamine  100 mg Oral Daily   Continuous Infusions: . ciprofloxacin 400 mg (09/21/19 0110)  . lactated ringers 100 mL/hr at 09/20/19 0530  . metronidazole 500 mg (09/21/19 0612)    Antimicrobials: Anti-infectives (From admission, onward)   Start     Dose/Rate Route Frequency Ordered Stop   09/20/19 1400  fluconazole (DIFLUCAN) tablet 100 mg     Discontinue     100 mg Oral Daily 09/20/19 1330 09/25/19 0959   09/19/19 1515  ciprofloxacin (CIPRO) IVPB 400 mg     Discontinue     400 mg 200 mL/hr over 60 Minutes Intravenous Every 12 hours  09/19/19 1501     09/19/19 1515  metroNIDAZOLE (FLAGYL) IVPB 500 mg     Discontinue     500 mg 100 mL/hr over 60 Minutes Intravenous Every 8 hours 09/19/19 1501     09/19/19 1230  ciprofloxacin (CIPRO) IVPB 400 mg        400 mg 200 mL/hr over 60 Minutes Intravenous  Once 09/19/19 1228 09/19/19 1532   09/19/19 1230  metroNIDAZOLE (FLAGYL) IVPB 500 mg        500 mg 100 mL/hr over 60 Minutes Intravenous  Once 09/19/19 1228 09/19/19 1430       Objective: Vitals: Today's Vitals   09/21/19 0021 09/21/19 0416 09/21/19 0507 09/21/19 0724  BP:   (!) 156/94   Pulse:   71   Resp:   18   Temp:   98.2 F (36.8 C)     TempSrc:   Oral   SpO2:   99%   Weight:      Height:      PainSc: 3  5   Asleep    Intake/Output Summary (Last 24 hours) at 09/21/2019 0817 Last data filed at 09/21/2019 0600 Gross per 24 hour  Intake 2563.61 ml  Output 4800 ml  Net -2236.39 ml   Filed Weights   09/19/19 0945  Weight: 56.7 kg   Weight change:    Intake/Output from previous day: 07/05 0701 - 07/06 0700 In: 2563.6 [P.O.:1590; I.V.:752.3; IV Piggyback:221.3] Out: 4800 [Urine:4800] Intake/Output this shift: No intake/output data recorded.  Examination:  General exam: AAO , NAD, weak appearing. HEENT:Oral mucosa moist, Ear/Nose WNL grossly, dentition normal. Respiratory system: bilaterally clear,no wheezing or crackles,no use of accessory muscle Cardiovascular system: S1 & S2 +, No JVD,. Gastrointestinal system: Abdomen soft, tender epigastrium,ND, BS+ Nervous System:Alert, awake, moving extremities and grossly nonfocal Extremities: No edema, distal peripheral pulses palpable.  Skin: No rashes,no icterus. MSK: Normal muscle bulk,tone, power    Data Reviewed: I have personally reviewed following labs and imaging studies CBC: Recent Labs  Lab 09/19/19 0947 09/20/19 0546 09/21/19 0431  WBC 16.7* 12.1* 7.9  HGB 13.8 11.8* 12.3  HCT 42.6 36.3 37.9  MCV 95.7 96.5 95.7  PLT 295 279 237   Basic Metabolic Panel: Recent Labs  Lab 09/19/19 0947 09/20/19 0546 09/21/19 0431  NA 138 138 138  K 3.8 3.1* 3.8  CL 103 103 105  CO2 25 27 25   GLUCOSE 96 103* 95  BUN 10 6* <5*  CREATININE 0.80 0.71 0.53  CALCIUM 9.1 9.0 9.0   GFR: Estimated Creatinine Clearance: 53.6 mL/min (by C-G formula based on SCr of 0.53 mg/dL). Liver Function Tests: Recent Labs  Lab 09/19/19 0947  AST 14*  ALT 12  ALKPHOS 65  BILITOT 1.7*  PROT 8.2*  ALBUMIN 4.6   Recent Labs  Lab 09/19/19 0947  LIPASE 18   No results for input(s): AMMONIA in the last 168 hours. Coagulation Profile: No results for input(s): INR,  PROTIME in the last 168 hours. Cardiac Enzymes: No results for input(s): CKTOTAL, CKMB, CKMBINDEX, TROPONINI in the last 168 hours. BNP (last 3 results) No results for input(s): PROBNP in the last 8760 hours. HbA1C: No results for input(s): HGBA1C in the last 72 hours. CBG: No results for input(s): GLUCAP in the last 168 hours. Lipid Profile: No results for input(s): CHOL, HDL, LDLCALC, TRIG, CHOLHDL, LDLDIRECT in the last 72 hours. Thyroid Function Tests: No results for input(s): TSH, T4TOTAL, FREET4, T3FREE, THYROIDAB in the last 72 hours. Anemia  Panel: No results for input(s): VITAMINB12, FOLATE, FERRITIN, TIBC, IRON, RETICCTPCT in the last 72 hours. Sepsis Labs: No results for input(s): PROCALCITON, LATICACIDVEN in the last 168 hours.  Recent Results (from the past 240 hour(s))  SARS Coronavirus 2 by RT PCR (hospital order, performed in [facility] hospital lab) Nasopharyngeal Nasopharyngeal Swab     Status: None   Collection Time: 09/19/19  2:33 PM   Specimen: Nasopharyngeal Swab  Result Value Ref Range Status   SARS Coronavirus 2 NEGATIVE NEGATIVE Final    Comment: (NOTE) SARS-CoV-2 target nucleic acids are NOT DETECTED.  The SARS-CoV-2 RNA is generally detectable in upper and lower respiratory specimens during the acute phase of infection. The lowest concentration of SARS-CoV-2 viral copies this assay can detect is 250 copies / mL. A negative result does not preclude SARS-CoV-2 infection and should not be used as the sole basis for treatment or other patient management decisions.  A negative result may occur with improper specimen collection / handling, submission of specimen other than nasopharyngeal swab, presence of viral mutation(s) within the areas targeted by this assay, and inadequate number of viral copies (<250 copies / mL). A negative result must be combined with clinical observations, patient history, and epidemiological information.  Fact Sheet for Patients:    StrictlyIdeas.no  Fact Sheet for Healthcare Providers: BankingDealers.co.za  This test is not yet approved or  cleared by the [place] FDA and has been authorized for detection and/or diagnosis of SARS-CoV-2 by FDA under an Emergency Use Authorization (EUA).  This EUA will remain in effect (meaning this test can be used) for the duration of the COVID-19 declaration under Section 564(b)(1) of the Act, 21 U.S.C. section 360bbb-3(b)(1), unless the authorization is terminated or revoked sooner.  Performed at [facility], [place] [address], [place], [place] [address]       Radiology Studies: CT Abdomen Pelvis W Contrast  Result Date: 09/19/2019 CLINICAL DATA:  Suspect diverticulitis. Abdominal pain beginning yesterday left-sided. EXAM: CT ABDOMEN AND PELVIS WITH CONTRAST TECHNIQUE: Multidetector CT imaging of the abdomen and pelvis was performed using the standard protocol following bolus administration of intravenous contrast. CONTRAST:  131mL OMNIPAQUE IOHEXOL 300 MG/ML  SOLN COMPARISON:  08/24/2019 FINDINGS: Lower chest: Lung bases are clear. Hepatobiliary: Gallbladder, liver and biliary tree are normal. Pancreas: Normal. Spleen: Small in somewhat misshapen but on changed from previous exams. Possible small hypodense mass present and unchanged likely benign. Adrenals/Urinary Tract: Adrenal glands are normal. Kidneys are normal in size without hydronephrosis or nephrolithiasis. Subcentimeter left lower pole renal cortical hypodensities too small to characterize but likely a cyst and unchanged. Ureters and bladder are normal. Stomach/Bowel: Stomach is normal. Small bowel is unremarkable. Appendix is normal. Diverticulosis of the colon. Interval development of pericolonic inflammation and minimal free fluid adjacent the mid to distal descending colon with associated wall thickening. Findings are compatible with acute  diverticulitis. There is no evidence of diverticular abscess or perforation. At that these findings are new compared to patient's recent previous exam. The prior exam from 08/24/2019 with equivocal for acute inflammation over the sigmoid colon. Vascular/Lymphatic: Minimal plaque over the aortic bifurcation as the aorta is normal in caliber. No significant adenopathy. Few small periaortic lymph nodes likely reactive. Reproductive: Normal. Other: Multiple pelvic phleboliths. Musculoskeletal: Moderate disc disease and endplate sclerosis at the T11-12 level unchanged. Prominent Schmorl's node over the superior endplate of L1 unchanged. IMPRESSION: 1. Interval development of acute diverticulitis involving a short segment of the mid to distal  descending colon. No evidence of perforation or diverticular abscess. 2. Subcentimeter left renal cortical hypodensity too small to characterize but likely a cyst and unchanged. 3.  Aortic Atherosclerosis (ICD10-I70.0). Electronically Signed   By: Marin Olp M.D.   On: 09/19/2019 11:48     LOS: 1 day   Antonieta Pert, MD [facility]  09/21/2019, 8:17 AM

## 2019-09-21 NOTE — TOC Progression Note (Signed)
Transition of Care [facility]) - Progression Note    Patient Details  Name: Charlotte Garcia MRN: [id number] Date of Birth: 25-Sep-1955  Transition of Care [facility]) CM/SW [place], [place] Phone Number: 09/21/2019, 9:52 AM  Clinical Narrative:    Follow up appointment at [facility] [place]- for October 14, 2019 @ 1:00pm.    Expected Discharge Plan: Home/Self Care Barriers to Discharge: No Barriers Identified  Expected Discharge Plan and Services Expected Discharge Plan: Home/Self Care   Discharge Planning Services: Follow-up appt scheduled Post Acute Care Choice: NA Living arrangements for the past 2 months: Apartment                 DME Arranged: N/A DME Agency: NA         HH Agency: NA         Social Determinants of Health (SDOH) Interventions    Readmission Risk Interventions No flowsheet data found.

## 2019-09-21 NOTE — Progress Notes (Signed)
[facility] Gastroenterology Progress Note  CHARM STENNER 64 y.o. 1955-04-14  CC:  Diverticulitis  Subjective: Patient states she is feeling worse today than yesterday.  She reports nausea, epigastric abdominal pain radiating into chest, and left upper quadrant pain, which she attributes to the medications that she is taking.  She states she has not been eating a lot, states she believes the medications are upsetting her stomach.  She has been having GERD recently at home and takes Alka-Seltzer regularly.  She is not having any left lower quadrant or right lower quadrant pain today.  She would like to try a soft diet to see if that helps her indigestion and epigastric discomfort.  Last bowel movement was Saturday or Sunday.  ROS : Review of Systems  Constitutional: Negative for chills and fever.  Gastrointestinal: Positive for abdominal pain (epigastric, LUQ), heartburn and nausea. Negative for blood in stool, constipation, diarrhea, melena and vomiting.   Objective: Vital signs in last 24 hours: Vitals:   09/21/19 0507 09/21/19 0920  BP: (!) 156/94 (!) 168/92  Pulse: 71 66  Resp: 18   Temp: 98.2 F (36.8 C)   SpO2: 99%     Physical Exam:  General:  Alert, oriented, cooperative, no acute distress, resting comfortably in bed  Head:  Normocephalic, without obvious abnormality, atraumatic  Eyes:  Anicteric sclera, EOMs intact  Lungs:   Clear to auscultation bilaterally, respirations unlabored  Heart:  Regular rate and rhythm, S1, S2 normal  Abdomen:   Soft, non-distended, moderate epigastric tenderness, mild LUQ tenderness, nomoactive to mildly increased bowel sounds, no guarding or peritoneal signs   Extremities: Extremities normal, atraumatic, no  edema  Pulses: 2+ and symmetric    Lab Results: Recent Labs    09/20/19 0546 09/21/19 0431  NA 138 138  K 3.1* 3.8  CL 103 105  CO2 27 25  GLUCOSE 103* 95  BUN 6* <5*  CREATININE 0.71 0.53  CALCIUM 9.0 9.0   Recent Labs     07 /04/21 0947  AST 14*  ALT 12  ALKPHOS 65  BILITOT 1.7*  PROT 8.2*  ALBUMIN 4.6   Recent Labs    09/20/19 0546 09/21/19 0431  WBC 12.1* 7.9  HGB 11.8* 12.3  HCT 36.3 37.9  MCV 96.5 95.7  PLT 279 265   No results for input(s): LABPROT, INR in the last 72 hours.  Assessment: Acute diverticulitis of mid to distal descending colon; no evidence of abscess or perforation Leukocytosis is now resolved (WBCs 7.9 today)  GERD/epigastric abdominal pain/dysphagia  Hypokalemia, resolved (K 3.8 today)  Anemia, resolved (Hgb 12.3 today)   Plan: Continue IV Ciprofloxacin and IV Flagyl.  Continue Metamucil once daily with Miralax PRN.  Patient states she does not like Metamucil; recommend Benefiber as an outpatient.  Initiate Protonix 40 mg once daily.  Recommend outpatient EGD for epigastric pain and GERD, as well as dysphagia.   Trial of soft diet.  [facility] will follow.  Salley Slaughter PA-C 09/21/2019, 11:09 AM  Contact #  [phone]

## 2019-09-22 LAB — CBC
HCT: 34.6 % — ABNORMAL LOW (ref 36.0–46.0)
Hemoglobin: 11.3 g/dL — ABNORMAL LOW (ref 12.0–15.0)
MCH: 31.4 pg (ref 26.0–34.0)
MCHC: 32.7 g/dL (ref 30.0–36.0)
MCV: 96.1 fL (ref 80.0–100.0)
Platelets: 300 10*3/uL (ref 150–400)
RBC: 3.6 MIL/uL — ABNORMAL LOW (ref 3.87–5.11)
RDW: 13.3 % (ref 11.5–15.5)
WBC: 7.5 10*3/uL (ref 4.0–10.5)
nRBC: 0 % (ref 0.0–0.2)

## 2019-09-22 LAB — BASIC METABOLIC PANEL
Anion gap: 7 (ref 5–15)
BUN: 10 mg/dL (ref 8–23)
CO2: 25 mmol/L (ref 22–32)
Calcium: 8.8 mg/dL — ABNORMAL LOW (ref 8.9–10.3)
Chloride: 106 mmol/L (ref 98–111)
Creatinine, Ser: 0.87 mg/dL (ref 0.44–1.00)
GFR calc Af Amer: >60 mL/min (ref >60)
GFR calc non Af Amer: >60 mL/min (ref >60)
Glucose, Bld: 104 mg/dL — ABNORMAL HIGH (ref 70–99)
Potassium: 3.8 mmol/L (ref 3.5–5.1)
Sodium: 138 mmol/L (ref 135–145)

## 2019-09-22 MED ORDER — AMOXICILLIN-POT CLAVULANATE 875-125 MG PO TABS
1.0000 | ORAL_TABLET | Freq: Two times a day (BID) | ORAL | 0 refills | Status: AC
Start: 2019-09-22 — End: 2019-10-06

## 2019-09-22 MED ORDER — OMEPRAZOLE 20 MG PO CPDR: 20.0000 mg | DELAYED_RELEASE_CAPSULE | Freq: Two times a day (BID) | ORAL | 1 refills | Status: AC

## 2019-09-22 MED ORDER — TRAMADOL HCL 50 MG PO TABS: 50.0000 mg | ORAL_TABLET | Freq: Four times a day (QID) | ORAL | 0 refills | Status: AC | PRN

## 2019-09-22 NOTE — Progress Notes (Signed)
[facility] Gastroenterology Progress Note  Charlotte Garcia 64 y.o. 08/13/1955   Subjective: Feels better. Tolerated soft diet for dinner yesterday. Reports loose nonbloody stool. Denies N/V. Heartburn better controlled. Wants to go home.  Objective: Vital signs: Vitals:   09/21/19 2149 09/22/19 0527  BP: (!) 168/100 (!) 143/94  Pulse: 65 64  Resp: 17 18  Temp: 98.6 F (37 C) 98.3 F (36.8 C)  SpO2: 99% 96%    Physical Exam: Gen: alert, no acute distress, thin, pleasant HEENT: anicteric sclera CV: RRR Chest: CTA B Abd: LLQ tenderness with minimal guarding, soft, nondistended, +BS Ext: no edema  Lab Results: Recent Labs    09/21/19 0431 09/22/19 0455  NA 138 138  K 3.8 3.8  CL 105 106  CO2 25 25  GLUCOSE 95 104*  BUN <5* 10  CREATININE 0.53 0.87  CALCIUM 9.0 8.8*   No results for input(s): AST, ALT, ALKPHOS, BILITOT, PROT, ALBUMIN in the last 72 hours. Recent Labs    09/21/19 0431 09/22/19 0455  WBC 7.9 7.5  HGB 12.3 11.3*  HCT 37.9 34.6*  MCV 95.7 96.1  PLT 265 300      Assessment/Plan: Resolving acute diverticulitis - change to Augmentin PO (if possible) since may not tolerate PO Flagyl for a 14 day course. F/U with me in August (office will arrange). Question outpt EGD if GERD persists or if dysphagia returns. Continue a PPI (Omeprazole) 20 mg PO BID until f/u. Ok to d/c today from GI standpoint.    Lear Ng 09/22/2019, 10:53 AM  Questions please call [phone] ID: Charlotte Garcia, female   DOB: April 26, 1955, 64 y.o.   MRN: [id number]

## 2019-09-22 NOTE — Plan of Care (Signed)

## 2019-09-22 NOTE — Discharge Summary (Signed)
Physician Discharge Summary  SARYIAH BENCOSME KDT:[id number] DOB: December 04, 1955 DOA: 09/19/2019  PCP: Patient, No Pcp Per  Admit date: 09/19/2019 Discharge date: 09/22/2019  Admitted From: Home Disposition: Home  Recommendations for Outpatient Follow-up:  1. Follow up with PCP in 1-2 weeks follow-up with GI.  As instructed 2. Please obtain BMP/CBC in one week 3. Please follow up on the following pending results:  Home Health: No Equipment/Devices: None  Discharge Condition: Stable Code Status: Full code Diet recommendation:  Diet Order            Diet - low sodium heart healthy           DIET SOFT Room service appropriate? 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 Patient seen in the ED work-up shows leukocytosis 16.7 CT showed pericolonic inflammation consistent with diverticulitis without evidence of abscess or perforation. Patient was admitted due to failure of outpatient treatment Patient was managed with IV Cipro Flagyl diet was slowly advanced.  Patient was seen by gastroenterology. At this time she has clinically improved GI is okay for discharge with Augmentin and they will follow-up as outpatient.  ADischarge Diagnoses:  Principal Problem:   Acute diverticulitis Active Problems:   Essential hypertension   Tobacco abuse   Diverticulitis large intestine w/o perforation or abscess w/o bleeding   Diverticulitis large intestine ssessment & Plan:  Acute diverticulitis involving a short segment of the mid to distal descending colon without perforation or abscess:Treated with oral antibiotics on outpatient basis with ongoing symptoms.   Admitted with failure of outpatient oral therapy. Recurrent episodes in past 2-5 years. Seen by Dr Acie Fredrickson GI at this time she has clinically improved.  She is tolerating low residue diet.  She will be discharged on PPI to help with gastritis symptoms and also with Augmentin for better tolerance for total of 14 days course and she will follow up with GI as outpatient.  Essential hypertension: Blood pressure on higher side she is on lisinopril at home which she can continue and go up on the dose for which she will follow up with PCP.  Tobacco abuse-follow-up outpatient, cessation advised   Consults: gi Subjective: Resting well tolerating diet no nausea vomiting fever chills abdomen pain is better. Wants to go home today. Discharge Exam: Vitals:   09/21/19 2149 09/22/19 0527  BP: (!) 168/100 (!) 143/94  Pulse: 65 64  Resp: 17 18  Temp: 98.6 F (37 C) 98.3 F (36.8 C)  SpO2: 99% 96%   General: Pt is alert, awake, not in acute distress Cardiovascular: RRR, S1/S2 +, no rubs, no gallops Respiratory: CTA bilaterally, no wheezing, no rhonchi Abdominal: Soft, NT, ND, bowel sounds + Extremities: no edema, no cyanosis  Discharge Instructions  Discharge Instructions    Diet - low sodium heart healthy   Complete by: As directed    Low fiber diet   Discharge instructions   Complete by: As directed    Low fiber diet  Please call call MD or return to ER for similar or worsening recurring problem that brought you to hospital or if any fever,nausea/vomiting,abdominal pain, uncontrolled pain, chest pain,  shortness of breath or any other alarming symptoms.  Please follow-up your doctor as instructed in a week time and call the office for appointment.  Please avoid alcohol, smoking, or any other illicit substance and maintain healthy habits including taking your regular medications as prescribed.  You were cared for by a hospitalist during your hospital stay. If you have any questions  about your discharge medications or the care you received while you were in the hospital after you are discharged, you can call the unit and ask to speak with the hospitalist on call if the hospitalist that took care of you is not available.  Once you are discharged, your primary care physician will handle any further medical issues. Please note that NO REFILLS for any discharge medications will be authorized once you are discharged, as it is imperative that you return to your primary care physician (or establish a relationship with a primary care physician if you do not have one) for your aftercare needs so that they can reassess your need for medications and monitor your lab values   Increase activity slowly   Complete by: As directed      Allergies as of 09/22/2019      Reactions   Sulfa Antibiotics Hives   Codeine Nausea And Vomiting   Morphine And Related Nausea Only      Medication List    STOP taking these medications   Fiber Adult Gummies 2 g Chew     TAKE these medications   amoxicillin-clavulanate 875-125 MG tablet Commonly known as: Augmentin Take 1 tablet by mouth 2 (two) times daily for 14 days.   aspirin EC 81 MG tablet Take 81 mg by mouth daily.   diphenhydramine-acetaminophen 25-500 MG Tabs tablet Commonly known as: TYLENOL PM Take 2 tablets by mouth at bedtime as needed (pain).   lisinopril 5 MG tablet Commonly known as: ZESTRIL Take 1 tablet (5 mg total) by mouth daily.   Multivitamin Adult Chew Chew 1 tablet by mouth daily.   omeprazole 20 MG capsule Commonly known as: PriLOSEC Take 1 capsule (20 mg total) by mouth 2 (two) times daily before a meal.   ondansetron 4 MG disintegrating tablet Commonly known as: Zofran ODT 4mg  ODT q4 hours prn nausea/vomit What changed:   how much to take  how to take this  when to take this  reasons to take this  additional instructions   traMADol 50 MG tablet Commonly known as: Ultram Take 1 tablet (50 mg  total) by mouth every 6 (six) hours as needed for up to 20 doses for moderate pain.       Follow-up [facility]. Go to.   Specialty: Internal Medicine Why: Scheduled appointment  October 14, 2019 @ 1:00PM. Please arrive 15 minutes early to complete new patient forms.  Contact information: [address] [id number] mc [place]       Wilford Corner, MD. Call.   Specialty: Gastroenterology Contact information: [address] [place] [address] [phone]              Allergies  Allergen Reactions  . Sulfa Antibiotics Hives  . Codeine Nausea And Vomiting  . Morphine And Related Nausea Only    The results of significant diagnostics from this hospitalization (including imaging, microbiology, ancillary and laboratory) are listed below for reference.    Microbiology: Recent Results (from the past 240 hour(s))  SARS Coronavirus 2  by RT PCR (hospital order, performed in [facility] hospital lab) Nasopharyngeal Nasopharyngeal Swab     Status: None   Collection Time: 09/19/19  2:33 PM   Specimen: Nasopharyngeal Swab  Result Value Ref Range Status   SARS Coronavirus 2 NEGATIVE NEGATIVE Final    Comment: (NOTE) SARS-CoV-2 target nucleic acids are NOT DETECTED.  The SARS-CoV-2 RNA is generally detectable in upper and lower respiratory specimens during the acute phase of infection. The lowest concentration of SARS-CoV-2 viral copies this assay can detect is 250 copies / mL. A negative result does not preclude SARS-CoV-2 infection and should not be used as the sole basis for treatment or other patient management decisions.  A negative result may occur with improper specimen collection / handling, submission of specimen other than nasopharyngeal swab, presence of viral mutation(s) within the areas targeted by this assay, and inadequate number of viral copies (<250 copies / mL). A  negative result must be combined with clinical observations, patient history, and epidemiological information.  Fact Sheet for Patients:   StrictlyIdeas.no  Fact Sheet for Healthcare Providers: BankingDealers.co.za  This test is not yet approved or  cleared by the [place] FDA and has been authorized for detection and/or diagnosis of SARS-CoV-2 by FDA under an Emergency Use Authorization (EUA).  This EUA will remain in effect (meaning this test can be used) for the duration of the COVID-19 declaration under Section 564(b)(1) of the Act, 21 U.S.C. section 360bbb-3(b)(1), unless the authorization is terminated or revoked sooner.  Performed at [facility], [place] [address], [place], [place] [address]     Procedures/Studies: CT Abdomen Pelvis W Contrast  Result Date: 09/19/2019 CLINICAL DATA:  Suspect diverticulitis. Abdominal pain beginning yesterday left-sided. EXAM: CT ABDOMEN AND PELVIS WITH CONTRAST TECHNIQUE: Multidetector CT imaging of the abdomen and pelvis was performed using the standard protocol following bolus administration of intravenous contrast. CONTRAST:  163mL OMNIPAQUE IOHEXOL 300 MG/ML  SOLN COMPARISON:  08/24/2019 FINDINGS: Lower chest: Lung bases are clear. Hepatobiliary: Gallbladder, liver and biliary tree are normal. Pancreas: Normal. Spleen: Small in somewhat misshapen but on changed from previous exams. Possible small hypodense mass present and unchanged likely benign. Adrenals/Urinary Tract: Adrenal glands are normal. Kidneys are normal in size without hydronephrosis or nephrolithiasis. Subcentimeter left lower pole renal cortical hypodensities too small to characterize but likely a cyst and unchanged. Ureters and bladder are normal. Stomach/Bowel: Stomach is normal. Small bowel is unremarkable. Appendix is normal. Diverticulosis of the colon. Interval development of pericolonic inflammation and minimal  free fluid adjacent the mid to distal descending colon with associated wall thickening. Findings are compatible with acute diverticulitis. There is no evidence of diverticular abscess or perforation. At that these findings are new compared to patient's recent previous exam. The prior exam from 08/24/2019 with equivocal for acute inflammation over the sigmoid colon. Vascular/Lymphatic: Minimal plaque over the aortic bifurcation as the aorta is normal in caliber. No significant adenopathy. Few small periaortic lymph nodes likely reactive. Reproductive: Normal. Other: Multiple pelvic phleboliths. Musculoskeletal: Moderate disc disease and endplate sclerosis at the T11-12 level unchanged. Prominent Schmorl's node over the superior endplate of L1 unchanged. IMPRESSION: 1. Interval development of acute diverticulitis involving a short segment of the mid to distal descending colon. No evidence of perforation or diverticular abscess. 2. Subcentimeter left renal cortical hypodensity too small to characterize but likely a cyst and unchanged. 3.  Aortic Atherosclerosis (ICD10-I70.0). Electronically Signed   By: Marin Olp M.D.   On: 09/19/2019 11:48  CT Abdomen Pelvis W Contrast  Result Date: 08/24/2019 CLINICAL DATA:  Abdominal pain, distension, nausea EXAM: CT ABDOMEN AND PELVIS WITH CONTRAST TECHNIQUE: Multidetector CT imaging of the abdomen and pelvis was performed using the standard protocol following bolus administration of intravenous contrast. CONTRAST:  180mL OMNIPAQUE IOHEXOL 300 MG/ML  SOLN COMPARISON:  10/06/2018 FINDINGS: Lower chest: Bibasilar subsegmental atelectasis. Hepatobiliary: No focal liver abnormality is seen. No gallstones, gallbladder wall thickening, or biliary dilatation. Pancreas: Unremarkable. No pancreatic ductal dilatation or surrounding inflammatory changes. Spleen: Round solid-appearing 2.2 cm mass at the superior aspect of the spleen (series 2, image 13), more conspicuous in appearance  than previous study. Small splenule is also noted. Adrenals/Urinary Tract: Unremarkable adrenal glands. Kidneys enhance symmetrically without focal lesion, stone, or hydronephrosis. Ureters are nondilated. Urinary bladder appears unremarkable. Stomach/Bowel: Focally thickened segment of sigmoid colon measuring approximately 8 cm containing numerous diverticula (series 2, images 56-57). Mild adjacent pericolonic stranding. No free fluid or adjacent fluid collection. No extraluminal air or free intraperitoneal air. Normal appendix is present within the right lower quadrant. Small bowel appears within normal limits. No dilated loops of bowel. Unremarkable stomach. Vascular/Lymphatic: Minimal scattered aortic atherosclerotic calcification. No aneurysm. No dominant pelvic lymphadenopathy. Reproductive: Uterus and bilateral adnexa are unremarkable. Other: No abdominal wall hernia. Musculoskeletal: Chronic degenerative endplate changes at Q68-34, unchanged. No new or acute osseous findings. IMPRESSION: 1. Findings of acute uncomplicated sigmoid diverticulitis. No evidence of abscess or perforation. 2. 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 Retrospectively, this lesion appears to have been present on prior CTs dating back to at least 2017 favoring a benign process such as a hemangioma. Given the stability for greater than 1 year, no dedicated follow-up imaging of this finding is required. This recommendation follows ACR consensus guidelines: White Paper of the ACR Incidental Findings Committee II on Splenic and Nodal Findings. [place] [phone]. Electronically Signed   By: Davina Poke D.O.   On: 08/24/2019 12:44    Labs: BNP (last 3 results) No results for input(s): BNP in the last 8760 hours. Basic Metabolic Panel: Recent Labs  Lab 09/19/19 0947 09/20/19 0546 09/21/19 0431 09/22/19 0455  NA 138 138 138 138  K 3.8 3.1* 3.8 3.8  CL 103 103 105 106  CO2 25 27 25 25   GLUCOSE 96 103* 95  104*  BUN 10 6* <5* 10  CREATININE 0.80 0.71 0.53 0.87  CALCIUM 9.1 9.0 9.0 8.8*   Liver Function Tests: Recent Labs  Lab 09/19/19 0947  AST 14*  ALT 12  ALKPHOS 65  BILITOT 1.7*  PROT 8.2*  ALBUMIN 4.6   Recent Labs  Lab 09/19/19 0947  LIPASE 18   No results for input(s): AMMONIA in the last 168 hours. CBC: Recent Labs  Lab 09/19/19 0947 09/20/19 0546 09/21/19 0431 09/22/19 0455  WBC 16.7* 12.1* 7.9 7.5  HGB 13.8 11.8* 12.3 11.3*  HCT 42.6 36.3 37.9 34.6*  MCV 95.7 96.5 95.7 96.1  PLT 295 279 265 300   Cardiac Enzymes: No results for input(s): CKTOTAL, CKMB, CKMBINDEX, TROPONINI in the last 168 hours. BNP: Invalid input(s): POCBNP CBG: No results for input(s): GLUCAP in the last 168 hours. D-Dimer No results for input(s): DDIMER in the last 72 hours. Hgb A1c No results for input(s): HGBA1C in the last 72 hours. Lipid Profile No results for input(s): CHOL, HDL, LDLCALC, TRIG, CHOLHDL, LDLDIRECT in the last 72 hours. Thyroid function studies No results for input(s): TSH, T4TOTAL, T3FREE, THYROIDAB in the last 72  hours.  Invalid input(s): FREET3 Anemia work up No results for input(s): VITAMINB12, FOLATE, FERRITIN, TIBC, IRON, RETICCTPCT in the last 72 hours. Urinalysis    Component Value Date/Time   COLORURINE YELLOW (A) 09/19/2019 [place] 09/19/2019 0947   LABSPEC <=1.005 09/19/2019 0947   PHURINE 6.0 09/19/2019 0947   GLUCOSEU NEGATIVE 09/19/2019 0947   HGBUR SMALL (A) 09/19/2019 0947   BILIRUBINUR NEGATIVE 09/19/2019 0947   KETONESUR 80 (A) 09/19/2019 0947   PROTEINUR NEGATIVE 09/19/2019 0947   UROBILINOGEN 1.0 11/06/2013 1803   NITRITE NEGATIVE 09/19/2019 0947   LEUKOCYTESUR NEGATIVE 09/19/2019 0947   Sepsis Labs Invalid input(s): PROCALCITONIN,  WBC,  LACTICIDVEN Microbiology Recent Results (from the past 240 hour(s))  SARS Coronavirus 2 by RT PCR (hospital order, performed in [facility] hospital lab) Nasopharyngeal  Nasopharyngeal Swab     Status: None   Collection Time: 09/19/19  2:33 PM   Specimen: Nasopharyngeal Swab  Result Value Ref Range Status   SARS Coronavirus 2 NEGATIVE NEGATIVE Final    Comment: (NOTE) SARS-CoV-2 target nucleic acids are NOT DETECTED.  The SARS-CoV-2 RNA is generally detectable in upper and lower respiratory specimens during the acute phase of infection. The lowest concentration of SARS-CoV-2 viral copies this assay can detect is 250 copies / mL. A negative result does not preclude SARS-CoV-2 infection and should not be used as the sole basis for treatment or other patient management decisions.  A negative result may occur with improper specimen collection / handling, submission of specimen other than nasopharyngeal swab, presence of viral mutation(s) within the areas targeted by this assay, and inadequate number of viral copies (<250 copies / mL). A negative result must be combined with clinical observations, patient history, and epidemiological information.  Fact Sheet for Patients:   StrictlyIdeas.no  Fact Sheet for Healthcare Providers: BankingDealers.co.za  This test is not yet approved or  cleared by the [place] FDA and has been authorized for detection and/or diagnosis of SARS-CoV-2 by FDA under an Emergency Use Authorization (EUA).  This EUA will remain in effect (meaning this test can be used) for the duration of the COVID-19 declaration under Section 564(b)(1) of the Act, 21 U.S.C. section 360bbb-3(b)(1), unless the authorization is terminated or revoked sooner.  Performed at [facility], [place] [address] [place], [place] [address]      Time coordinating discharge: 25 minutes  SIGNED: Antonieta Pert, MD  [facility] 09/22/2019, 11:23 AM  If 7PM-7AM, please contact night-coverage [email or web address]

## 2019-10-14 ENCOUNTER — Ambulatory Visit: Payer: Self-pay | Admitting: Nurse Practitioner

## 2020-10-23 ENCOUNTER — Emergency Department (HOSPITAL_BASED_OUTPATIENT_CLINIC_OR_DEPARTMENT_OTHER): Payer: Medicare HMO

## 2020-10-23 ENCOUNTER — Emergency Department (HOSPITAL_BASED_OUTPATIENT_CLINIC_OR_DEPARTMENT_OTHER)
Admission: EM | Admit: 2020-10-23 | Discharge: 2020-10-23 | Disposition: A | Discharge status: Home / Self Care | Payer: Medicare HMO | Attending: Emergency Medicine | Admitting: Emergency Medicine

## 2020-10-23 ENCOUNTER — Encounter (HOSPITAL_BASED_OUTPATIENT_CLINIC_OR_DEPARTMENT_OTHER): Payer: Self-pay

## 2020-10-23 ENCOUNTER — Other Ambulatory Visit (HOSPITAL_BASED_OUTPATIENT_CLINIC_OR_DEPARTMENT_OTHER): Payer: Self-pay

## 2020-10-23 ENCOUNTER — Other Ambulatory Visit: Payer: Self-pay

## 2020-10-23 DIAGNOSIS — R3915 Urgency of urination: Secondary | ICD-10-CM | POA: Diagnosis not present

## 2020-10-23 DIAGNOSIS — R112 Nausea with vomiting, unspecified: Secondary | ICD-10-CM | POA: Insufficient documentation

## 2020-10-23 DIAGNOSIS — Z79899 Other long term (current) drug therapy: Secondary | ICD-10-CM | POA: Insufficient documentation

## 2020-10-23 DIAGNOSIS — F17210 Nicotine dependence, cigarettes, uncomplicated: Secondary | ICD-10-CM | POA: Diagnosis not present

## 2020-10-23 DIAGNOSIS — R350 Frequency of micturition: Secondary | ICD-10-CM | POA: Diagnosis not present

## 2020-10-23 DIAGNOSIS — I10 Essential (primary) hypertension: Secondary | ICD-10-CM | POA: Insufficient documentation

## 2020-10-23 DIAGNOSIS — Z7982 Long term (current) use of aspirin: Secondary | ICD-10-CM | POA: Diagnosis not present

## 2020-10-23 DIAGNOSIS — R1032 Left lower quadrant pain: Secondary | ICD-10-CM | POA: Diagnosis present

## 2020-10-23 DIAGNOSIS — K219 Gastro-esophageal reflux disease without esophagitis: Secondary | ICD-10-CM | POA: Insufficient documentation

## 2020-10-23 LAB — URINALYSIS, MICROSCOPIC (REFLEX)

## 2020-10-23 LAB — URINALYSIS, ROUTINE W REFLEX MICROSCOPIC
Bilirubin Urine: NEGATIVE
Glucose, UA: NEGATIVE mg/dL
Ketones, ur: NEGATIVE mg/dL
Leukocytes,Ua: NEGATIVE
Nitrite: NEGATIVE
Protein, ur: NEGATIVE mg/dL
Specific Gravity, Urine: 1.015 (ref 1.005–1.030)
pH: 7 (ref 5.0–8.0)

## 2020-10-23 LAB — COMPREHENSIVE METABOLIC PANEL
ALT: 10 U/L (ref 0–44)
AST: 10 U/L — ABNORMAL LOW (ref 15–41)
Albumin: 4.6 g/dL (ref 3.5–5.0)
Alkaline Phosphatase: 62 U/L (ref 38–126)
Anion gap: 9 (ref 5–15)
BUN: 10 mg/dL (ref 8–23)
CO2: 27 mmol/L (ref 22–32)
Calcium: 9.8 mg/dL (ref 8.9–10.3)
Chloride: 103 mmol/L (ref 98–111)
Creatinine, Ser: 0.62 mg/dL (ref 0.44–1.00)
GFR, Estimated: >60 mL/min (ref >60)
Glucose, Bld: 88 mg/dL (ref 70–99)
Potassium: 3.8 mmol/L (ref 3.5–5.1)
Sodium: 139 mmol/L (ref 135–145)
Total Bilirubin: 0.7 mg/dL (ref 0.3–1.2)
Total Protein: 7.4 g/dL (ref 6.5–8.1)

## 2020-10-23 LAB — CBC WITH DIFFERENTIAL/PLATELET
Abs Immature Granulocytes: 0.02 10*3/uL (ref 0.00–0.07)
Basophils Absolute: 0 10*3/uL (ref 0.0–0.1)
Basophils Relative: 0 %
Eosinophils Absolute: 0.1 10*3/uL (ref 0.0–0.5)
Eosinophils Relative: 1 %
HCT: 43.4 % (ref 36.0–46.0)
Hemoglobin: 14.3 g/dL (ref 12.0–15.0)
Immature Granulocytes: 0 %
Lymphocytes Relative: 32 %
Lymphs Abs: 2.3 10*3/uL (ref 0.7–4.0)
MCH: 31 pg (ref 26.0–34.0)
MCHC: 32.9 g/dL (ref 30.0–36.0)
MCV: 93.9 fL (ref 80.0–100.0)
Monocytes Absolute: 0.4 10*3/uL (ref 0.1–1.0)
Monocytes Relative: 5 %
Neutro Abs: 4.4 10*3/uL (ref 1.7–7.7)
Neutrophils Relative %: 62 %
Platelets: 301 10*3/uL (ref 150–400)
RBC: 4.62 MIL/uL (ref 3.87–5.11)
RDW: 14.6 % (ref 11.5–15.5)
WBC: 7.2 10*3/uL (ref 4.0–10.5)
nRBC: 0 % (ref 0.0–0.2)

## 2020-10-23 LAB — LIPASE, BLOOD: Lipase: 25 U/L (ref 11–51)

## 2020-10-23 MED ORDER — IOHEXOL 300 MG/ML  SOLN
100.0000 mL | Freq: Once | INTRAMUSCULAR | Status: AC | PRN
  Administered 2020-10-23: 100 mL via INTRAVENOUS

## 2020-10-23 MED ORDER — LISINOPRIL 5 MG PO TABS
5.0000 mg | ORAL_TABLET | Freq: Every day | ORAL | 1 refills | Status: AC
  Filled 2020-10-23: qty 30, 30d supply, fill #0
  Filled 2020-12-27: qty 30, 30d supply, fill #1

## 2020-10-23 MED ORDER — HYDROCODONE-ACETAMINOPHEN 5-325 MG PO TABS
1.0000 | ORAL_TABLET | Freq: Four times a day (QID) | ORAL | 0 refills | Status: AC | PRN
  Filled 2020-10-23: qty 10, 3d supply, fill #0

## 2020-10-23 MED ORDER — FENTANYL CITRATE (PF) 100 MCG/2ML IJ SOLN
50.0000 ug | Freq: Once | INTRAMUSCULAR | Status: AC
  Administered 2020-10-23: 50 ug via INTRAVENOUS
  Filled 2020-10-23: qty 2

## 2020-10-23 MED ORDER — ONDANSETRON HCL 4 MG PO TABS
4.0000 mg | ORAL_TABLET | Freq: Four times a day (QID) | ORAL | 0 refills | Status: AC
  Filled 2020-10-23: qty 12, 4d supply, fill #0

## 2020-10-23 MED ORDER — LISINOPRIL 10 MG PO TABS
5.0000 mg | ORAL_TABLET | Freq: Once | ORAL | Status: AC
  Administered 2020-10-23: 5 mg via ORAL
  Filled 2020-10-23: qty 1

## 2020-10-23 MED ORDER — LACTATED RINGERS IV BOLUS
1000.0000 mL | Freq: Once | INTRAVENOUS | Status: AC
  Administered 2020-10-23: 1000 mL via INTRAVENOUS

## 2020-10-23 MED ORDER — ONDANSETRON HCL 4 MG/2ML IJ SOLN
4.0000 mg | Freq: Once | INTRAMUSCULAR | Status: AC
  Administered 2020-10-23: 4 mg via INTRAVENOUS
  Filled 2020-10-23: qty 2

## 2020-10-23 MED ORDER — AMOXICILLIN-POT CLAVULANATE 875-125 MG PO TABS
1.0000 | ORAL_TABLET | Freq: Two times a day (BID) | ORAL | 0 refills | Status: DC
  Filled 2020-10-23: qty 14, 7d supply, fill #0

## 2020-10-23 NOTE — ED Notes (Signed)
Patient transported to CT 

## 2020-10-23 NOTE — Discharge Instructions (Addendum)
Thankfully today there is no sign of diverticulitis and your lab work looks good.  However given your history we will start the antibiotics just in case.  If you start having worsening pain, fever, vomiting or other concerns return to the emergency room or speak with your doctor.

## 2020-10-23 NOTE — ED Triage Notes (Signed)
"  History of diverticulitis, having pain in left flank area radiating in to back x 1 week, eating makes it worse, my throat hurts as well" per pt   Patient states she did have urinary frequency and pressure last night

## 2020-10-23 NOTE — ED Provider Notes (Signed)
[facility] EMERGENCY DEPARTMENT Provider Note   CSN: [id number] Arrival date & time: 10/23/20  0947     History Chief Complaint  Patient presents with   Flank Pain    Charlotte Garcia is a 65 y.o. female.  Patient is a 65 year old female with a history of recurrent diverticulitis, hypertension, heart murmur and anemia who is presenting today with 5 days of worsening left-sided abdominal pain.  It has gradually been worsening and is now radiating into her back.  Eating, movements and palpation make the symptoms worse.  She has had low-grade temperatures intermittently for the last 5 days.  She has had infrequent nausea and vomiting.  She has been drinking mostly liquids and eating very little.  She last night did notice some frequency and urgency when she urinated but denies any dysuria.  No recent diarrhea but feels a little bit constipated.  Last had an episode of diverticulitis approximately 9 to 10 months ago.  She has only had removal of the cyst but no other abdominal surgeries.  She denies any cough, congestion.  During her last colonoscopy she was noted to have 3 polyps and diverticulosis.  The history is provided by the patient and medical records.  Flank Pain      Past Medical History:  Diagnosis Date   Anemia    Arthritis    Hands and generalized   Chest discomfort 09/17/2017   per pt weight on chest:   cardiologist consult w/ dr berry 09-23-2017  normal nuclear stress test    Diverticulosis    Endometrial polyp    Exposure to TB    GERD (gastroesophageal reflux disease)    Headache    History of diverticulitis of colon    recurrent 2014; 2015; x2 in 2017   History of ovarian cyst    Hypertension    Murmur, heart    Tobacco abuse    Uterine fibroid     Patient Active Problem List   Diagnosis Date Noted   Diverticulitis large intestine 09/20/2019   Diverticulitis large intestine w/o perforation or abscess w/o bleeding 09/19/2019   Family history of  coronary artery disease in father 12/22/2017   Endometrial polyp 10/06/2017    Class: Present on Admission   Atypical chest pain 11/06/2013   Hypokalemia 11/06/2013   Acute diverticulitis 02/27/2013   Essential hypertension    Tobacco abuse     Past Surgical History:  Procedure Laterality Date   CARDIOVASCULAR STRESS TEST  09-26-2017   dr berry   Low risk nuclear study w/ no ischemia/  normal LV function and wall motion ,  nuclear stress ef 56%   COLONOSCOPY     CYSTECTOMY     forehead; back of head   DILATATION & CURETTAGE/HYSTEROSCOPY WITH MYOSURE N/A 10/06/2017   Procedure: DILATATION & CURETTAGE/HYSTEROSCOPY WITH MYOSURE;  Surgeon: Arvella Nigh, MD;  Location: [facility];  Service: Gynecology;  Laterality: N/A;   DILATION AND CURETTAGE OF UTERUS     TRANSTHORACIC ECHOCARDIOGRAM  09/08/2015   mild focal basal hypertrophy,  ef 60-65%/  trivial MR   WRIST SURGERY     ganglion cyst     OB History     Gravida  3   Para  1   Term  1   Preterm      AB  2   Living  1      SAB  2   IAB      Ectopic  Multiple      Live Births              Family History  Problem Relation Age of Onset   Renal Disease Brother    Hypertension Father    Heart disease Father        MI in his 59's   Hypertension Mother    Heart disease Mother     Social History   Tobacco Use   Smoking status: Some Days    Packs/day: 0.25    Years: 25.00    Pack years: 6.25    Types: Cigarettes   Smokeless tobacco: Never  Vaping Use   Vaping Use: Never used  Substance Use Topics   Alcohol use: Yes    Comment: 1-2 glasses of wine 3 times a week   Drug use: Yes    Types: Marijuana    Comment: 3 weeks ago as of 09/17/2017    Home Medications Prior to Admission medications   Medication Sig Start Date End Date Taking? Authorizing Provider  aspirin EC 81 MG tablet Take 81 mg by mouth daily.     [provider]  diphenhydramine-acetaminophen (TYLENOL PM)  25-500 MG TABS tablet Take 2 tablets by mouth at bedtime as needed (pain).    [provider]  lisinopril (ZESTRIL) 5 MG tablet Take 1 tablet (5 mg total) by mouth daily. 08/24/19   Lacretia Leigh, MD  Multiple Vitamins-Minerals (MULTIVITAMIN ADULT) CHEW Chew 1 tablet by mouth daily.    [provider]  omeprazole (PRILOSEC) 20 MG capsule Take 1 capsule (20 mg total) by mouth 2 (two) times daily before a meal. 09/22/19 10/22/19  Antonieta Pert, MD  ondansetron (ZOFRAN ODT) 4 MG disintegrating tablet '4mg'$  ODT q4 hours prn nausea/vomit Patient taking differently: Take 4 mg by mouth every 4 (four) hours as needed for nausea or vomiting.  10/06/18   Jacqlyn Larsen, PA-C  traMADol (ULTRAM) 50 MG tablet Take 1 tablet (50 mg total) by mouth every 6 (six) hours as needed for up to 20 doses for moderate pain. 09/22/19   Antonieta Pert, MD    Allergies    Sulfa antibiotics, Codeine, and Morphine and related  Review of Systems   Review of Systems  Genitourinary:  Positive for flank pain.  All other systems reviewed and are negative.  Physical Exam Updated Vital Signs BP (!) 152/94 (BP Location: Right Arm)   Pulse 69   Temp 98 F (36.7 C) (Oral)   Resp 18   SpO2 99%   Physical Exam Vitals and nursing note reviewed.  Constitutional:      General: She is not in acute distress.    Appearance: Normal appearance. She is well-developed and normal weight.  HENT:     Head: Normocephalic and atraumatic.  Eyes:     Pupils: Pupils are equal, round, and reactive to light.  Cardiovascular:     Rate and Rhythm: Normal rate and regular rhythm.     Heart sounds: Normal heart sounds. No murmur heard.   No friction rub.  Pulmonary:     Effort: Pulmonary effort is normal.     Breath sounds: Normal breath sounds. No wheezing or rales.  Abdominal:     General: Bowel sounds are normal. There is no distension.     Palpations: Abdomen is soft.     Tenderness: There is abdominal tenderness in the left lower  quadrant. There is guarding. There is no right CVA tenderness, left CVA tenderness or rebound.  Musculoskeletal:        General: No tenderness. Normal range of motion.     Cervical back: Normal range of motion and neck supple.     Right lower leg: No edema.     Left lower leg: No edema.     Comments: No edema  Skin:    General: Skin is warm and dry.     Findings: No rash.  Neurological:     Mental Status: She is alert and oriented to person, place, and time. Mental status is at baseline.     Cranial Nerves: No cranial nerve deficit.  Psychiatric:        Mood and Affect: Mood normal.        Behavior: Behavior normal.    ED Results / Procedures / Treatments   Labs (all labs ordered are listed, but only abnormal results are displayed) Labs Reviewed  COMPREHENSIVE METABOLIC PANEL - Abnormal; Notable for the following components:      Result Value   AST 10 (*)    All other components within normal limits  URINALYSIS, ROUTINE W REFLEX MICROSCOPIC - Abnormal; Notable for the following components:   Hgb urine dipstick TRACE (*)    All other components within normal limits  URINALYSIS, MICROSCOPIC (REFLEX) - Abnormal; Notable for the following components:   Bacteria, UA RARE (*)    All other components within normal limits  CBC WITH DIFFERENTIAL/PLATELET  LIPASE, BLOOD    EKG None  Radiology CT ABDOMEN PELVIS W CONTRAST  Result Date: 10/23/2020 CLINICAL DATA:  Diverticulitis suspected.  Left flank pain. EXAM: CT ABDOMEN AND PELVIS WITH CONTRAST TECHNIQUE: Multidetector CT imaging of the abdomen and pelvis was performed using the standard protocol following bolus administration of intravenous contrast. CONTRAST:  185m OMNIPAQUE IOHEXOL 300 MG/ML  SOLN COMPARISON:  09/19/2019 FINDINGS: Lower chest: Lung bases are clear. Hepatobiliary: Small focus of enhancement or hyperdensity along the hepatic dome on sequence 2 image 5 appears chronic. Slightly decreased attenuation of the liver.  Portal venous system is patent. Normal appearance of the gallbladder. No significant biliary dilatation. Probable tiny cyst in the liver adjacent to the gallbladder. Pancreas: Unremarkable. No pancreatic ductal dilatation or surrounding inflammatory changes. Spleen: Normal in size without focal abnormality. Adrenals/Urinary Tract: Stable fullness in left adrenal gland is suggestive for hyperplasia. Normal appearance of the right adrenal gland. 7 mm low-density structure in left kidney lower pole is similar to the exam in 2021 but too small to definitively characterize. Negative for hydronephrosis. Normal appearance of the urinary bladder. Stomach/Bowel: Extensive diverticulosis involving the sigmoid colon and left colon. No evidence for acute colonic or bowel inflammation. Normal appendix. Wall thickening in the distal stomach but minimal change since 2021. No inflammatory changes around the stomach. Vascular/Lymphatic: Atherosclerotic calcifications in the abdominal aorta without aneurysm. Main visceral arteries are patent. No abdominopelvic lymph node enlargement. Reproductive: Uterus and bilateral adnexa are unremarkable. Other: Negative for free fluid. Negative for free air. Tiny umbilical hernia containing fat. Musculoskeletal: No acute bone abnormality. Disc space irregularity with endplate sclerosis at [id number] Prominent Schmorl's node along superior endplate of L1. Facet arthropathy in lower lumbar spine. IMPRESSION: 1. No acute abnormality in the abdomen or pelvis. 2. Colonic diverticulosis without acute bowel inflammation. 3. Aortic Atherosclerosis (ICD10-I70.0). Electronically Signed   By: AMarkus DaftM.D.   On: 10/23/2020 13:27    Procedures Procedures   Medications Ordered in ED Medications  lactated ringers bolus 1,000 mL (has no administration in time range)  fentaNYL (  SUBLIMAZE) injection 50 mcg (has no administration in time range)  ondansetron (ZOFRAN) injection 4 mg (has no administration  in time range)    ED Course  I have reviewed the triage vital signs and the nursing notes.  Pertinent labs & imaging results that were available during my care of the patient were reviewed by me and considered in my medical decision making (see chart for details).    MDM Rules/Calculators/A&P                           Patient is a 65 year old female presenting today with left lower quadrant pain that is now worsened over the last 5 days.  Prior history of diverticulitis and suspect that that is the same issue today.  Last episode was 9 to 10 months ago.  Patient is complaining of some urinary frequency and urgency will ensure no evidence of UTI, pyelonephritis or kidney stone.  Patient given pain control.  Labs and imaging are pending.  Also patient reports she has been out of her blood pressure medication for the last 1 week and is concerned that that is what is making her blood pressure high.  She was given a dose of her 5 mg of lisinopril.  2:02 PM Labs are within normal limits.  CT without acute findings.  Blood pressure improved after lisinopril now 139/99.  However given patient's pain, recurrent diverticulitis will cover with Augmentin.  Encourage patient to follow-up with PCP if symptoms or not improving.  She was given return precautions.  MDM   Amount and/or Complexity of Data Reviewed Clinical lab tests: ordered and reviewed Tests in the radiology section of CPT: ordered and reviewed Independent visualization of images, tracings, or specimens: yes    Final Clinical Impression(s) / ED Diagnoses Final diagnoses:  Acute left lower quadrant pain    Rx / DC Orders ED Discharge Orders          Ordered    HYDROcodone-acetaminophen (NORCO/VICODIN) 5-325 MG tablet  Every 6 hours PRN        10/23/20 1406    ondansetron (ZOFRAN) 4 MG tablet  Every 6 hours        10/23/20 1406    amoxicillin-clavulanate (AUGMENTIN) 875-125 MG tablet  Every 12 hours        10/23/20 1406     lisinopril (ZESTRIL) 5 MG tablet  Daily        10/23/20 1406             Blanchie Dessert, MD 10/23/20 1407

## 2020-12-27 ENCOUNTER — Encounter (HOSPITAL_BASED_OUTPATIENT_CLINIC_OR_DEPARTMENT_OTHER): Payer: Self-pay

## 2020-12-27 ENCOUNTER — Emergency Department (HOSPITAL_BASED_OUTPATIENT_CLINIC_OR_DEPARTMENT_OTHER): Payer: Medicare HMO

## 2020-12-27 ENCOUNTER — Ambulatory Visit: Payer: Medicare HMO | Attending: Internal Medicine

## 2020-12-27 ENCOUNTER — Emergency Department (HOSPITAL_BASED_OUTPATIENT_CLINIC_OR_DEPARTMENT_OTHER)
Admission: EM | Admit: 2020-12-27 | Discharge: 2020-12-27 | Disposition: A | Discharge status: Home / Self Care | Payer: Medicare HMO | Attending: Emergency Medicine | Admitting: Emergency Medicine

## 2020-12-27 ENCOUNTER — Other Ambulatory Visit (HOSPITAL_BASED_OUTPATIENT_CLINIC_OR_DEPARTMENT_OTHER): Payer: Self-pay

## 2020-12-27 ENCOUNTER — Other Ambulatory Visit: Payer: Self-pay

## 2020-12-27 DIAGNOSIS — I10 Essential (primary) hypertension: Secondary | ICD-10-CM | POA: Insufficient documentation

## 2020-12-27 DIAGNOSIS — F17210 Nicotine dependence, cigarettes, uncomplicated: Secondary | ICD-10-CM | POA: Diagnosis not present

## 2020-12-27 DIAGNOSIS — Z79899 Other long term (current) drug therapy: Secondary | ICD-10-CM | POA: Diagnosis not present

## 2020-12-27 DIAGNOSIS — Z23 Encounter for immunization: Secondary | ICD-10-CM

## 2020-12-27 DIAGNOSIS — Z7982 Long term (current) use of aspirin: Secondary | ICD-10-CM | POA: Diagnosis not present

## 2020-12-27 DIAGNOSIS — W010XXA Fall on same level from slipping, tripping and stumbling without subsequent striking against object, initial encounter: Secondary | ICD-10-CM | POA: Insufficient documentation

## 2020-12-27 DIAGNOSIS — M25561 Pain in right knee: Secondary | ICD-10-CM | POA: Diagnosis not present

## 2020-12-27 DIAGNOSIS — M25571 Pain in right ankle and joints of right foot: Secondary | ICD-10-CM | POA: Diagnosis not present

## 2020-12-27 DIAGNOSIS — S63601A Unspecified sprain of right thumb, initial encounter: Secondary | ICD-10-CM | POA: Diagnosis not present

## 2020-12-27 DIAGNOSIS — G8929 Other chronic pain: Secondary | ICD-10-CM

## 2020-12-27 DIAGNOSIS — S63681A Other sprain of right thumb, initial encounter: Secondary | ICD-10-CM

## 2020-12-27 DIAGNOSIS — S6991XA Unspecified injury of right wrist, hand and finger(s), initial encounter: Secondary | ICD-10-CM | POA: Diagnosis present

## 2020-12-27 DIAGNOSIS — W19XXXA Unspecified fall, initial encounter: Secondary | ICD-10-CM

## 2020-12-27 MED ORDER — DICLOFENAC SODIUM 1 % EX GEL: 4.0000 g | Freq: Four times a day (QID) | CUTANEOUS | 0 refills | Status: AC

## 2020-12-27 NOTE — ED Provider Notes (Signed)
[facility] EMERGENCY DEPARTMENT Provider Note   CSN: [id number] Arrival date & time: 12/27/20  0805     History Chief Complaint  Patient presents with   Fall   Knee Injury   thumb injury    Charlotte Garcia is a 65 y.o. female.  65 yo F with a chief complaints of a fall.  This happened a couple days ago.  The patient has chronic right knee pain and she felt like she had sudden severe pain and dropped to the ground.  Complaining of pain to that right knee the right ankle and right thumb.  She denies head injury denies loss consciousness denies neck pain back pain chest pain abdominal pain.  The history is provided by the patient.  Fall This is a new problem. The current episode started 2 days ago. The problem occurs constantly. The problem has not changed since onset.Pertinent negatives include no chest pain, no headaches and no shortness of breath. The symptoms are aggravated by walking, bending and twisting. Nothing relieves the symptoms. She has tried nothing for the symptoms. The treatment provided no relief.      Past Medical History:  Diagnosis Date   Anemia    Arthritis    Hands and generalized   Chest discomfort 09/17/2017   per pt weight on chest:   cardiologist consult w/ dr berry 09-23-2017  normal nuclear stress test    Diverticulosis    Endometrial polyp    Exposure to TB    GERD (gastroesophageal reflux disease)    Headache    History of diverticulitis of colon    recurrent 2014; 2015; x2 in 2017   History of ovarian cyst    Hypertension    Murmur, heart    Tobacco abuse    Uterine fibroid     Patient Active Problem List   Diagnosis Date Noted   Diverticulitis large intestine 09/20/2019   Diverticulitis large intestine w/o perforation or abscess w/o bleeding 09/19/2019   Family history of coronary artery disease in father 12/22/2017   Endometrial polyp 10/06/2017    Class: Present on Admission   Atypical chest pain 11/06/2013    Hypokalemia 11/06/2013   Acute diverticulitis 02/27/2013   Essential hypertension    Tobacco abuse     Past Surgical History:  Procedure Laterality Date   CARDIOVASCULAR STRESS TEST  09-26-2017   dr berry   Low risk nuclear study w/ no ischemia/  normal LV function and wall motion ,  nuclear stress ef 56%   COLONOSCOPY     CYSTECTOMY     forehead; back of head   DILATATION & CURETTAGE/HYSTEROSCOPY WITH MYOSURE N/A 10/06/2017   Procedure: DILATATION & CURETTAGE/HYSTEROSCOPY WITH MYOSURE;  Surgeon: Arvella Nigh, MD;  Location: [facility];  Service: Gynecology;  Laterality: N/A;   DILATION AND CURETTAGE OF UTERUS     TRANSTHORACIC ECHOCARDIOGRAM  09/08/2015   mild focal basal hypertrophy,  ef 60-65%/  trivial MR   WRIST SURGERY     ganglion cyst     OB History     Gravida  3   Para  1   Term  1   Preterm      AB  2   Living  1      SAB  2   IAB      Ectopic      Multiple      Live Births  Family History  Problem Relation Age of Onset   Renal Disease Brother    Hypertension Father    Heart disease Father        MI in his 38's   Hypertension Mother    Heart disease Mother     Social History   Tobacco Use   Smoking status: Some Days    Packs/day: 0.25    Years: 25.00    Pack years: 6.25    Types: Cigarettes   Smokeless tobacco: Never  Vaping Use   Vaping Use: Never used  Substance Use Topics   Alcohol use: Yes    Comment: 1-2 glasses of wine 3 times a week   Drug use: Yes    Types: Marijuana    Comment: 3 weeks ago as of 09/17/2017    Home Medications Prior to Admission medications   Medication Sig Start Date End Date Taking? Authorizing Provider  diclofenac Sodium (VOLTAREN) 1 % GEL Apply 4 g topically 4 (four) times daily. 12/27/20  Yes Deno Etienne, DO  amoxicillin-clavulanate (AUGMENTIN) 875-125 MG tablet Take 1 tablet by mouth every 12 (twelve) hours. 10/23/20   Blanchie Dessert, MD  aspirin EC 81 MG tablet  Take 81 mg by mouth daily.     [provider]  diphenhydramine-acetaminophen (TYLENOL PM) 25-500 MG TABS tablet Take 2 tablets by mouth at bedtime as needed (pain).    [provider]  HYDROcodone-acetaminophen (NORCO/VICODIN) 5-325 MG tablet Take 1 tablet by mouth every 6 (six) hours as needed. 10/23/20   Blanchie Dessert, MD  lisinopril (ZESTRIL) 5 MG tablet Take 1 tablet (5 mg total) by mouth daily. 10/23/20   Blanchie Dessert, MD  Multiple Vitamins-Minerals (MULTIVITAMIN ADULT) CHEW Chew 1 tablet by mouth daily.    [provider]  omeprazole (PRILOSEC) 20 MG capsule Take 1 capsule (20 mg total) by mouth 2 (two) times daily before a meal. 09/22/19 10/22/19  Antonieta Pert, MD  ondansetron (ZOFRAN ODT) 4 MG disintegrating tablet 4mg  ODT q4 hours prn nausea/vomit Patient taking differently: Take 4 mg by mouth every 4 (four) hours as needed for nausea or vomiting.  10/06/18   Jacqlyn Larsen, PA-C  ondansetron (ZOFRAN) 4 MG tablet Take 1 tablet (4 mg total) by mouth every 6 (six) hours. 10/23/20   Blanchie Dessert, MD  traMADol (ULTRAM) 50 MG tablet Take 1 tablet (50 mg total) by mouth every 6 (six) hours as needed for up to 20 doses for moderate pain. 09/22/19   Antonieta Pert, MD    Allergies    Sulfa antibiotics, Codeine, and Morphine and related  Review of Systems   Review of Systems  Constitutional:  Negative for chills and fever.  HENT:  Negative for congestion and rhinorrhea.   Eyes:  Negative for redness and visual disturbance.  Respiratory:  Negative for shortness of breath and wheezing.   Cardiovascular:  Negative for chest pain and palpitations.  Gastrointestinal:  Negative for nausea and vomiting.  Genitourinary:  Negative for dysuria and urgency.  Musculoskeletal:  Positive for arthralgias and myalgias.  Skin:  Negative for pallor and wound.  Neurological:  Negative for dizziness and headaches.   Physical Exam Updated Vital Signs BP (!) 152/103   Pulse 66   Temp  98.3 F (36.8 C) (Oral)   Resp 16   Ht 5\' 2"  (1.575 m)   Wt 53.5 kg   SpO2 100%   BMI 21.58 kg/m   Physical Exam Vitals and nursing note reviewed.  Constitutional:  General: She is not in acute distress.    Appearance: She is well-developed. She is not diaphoretic.  HENT:     Head: Normocephalic and atraumatic.  Eyes:     Pupils: Pupils are equal, round, and reactive to light.  Cardiovascular:     Rate and Rhythm: Normal rate and regular rhythm.     Heart sounds: No murmur heard.   No friction rub. No gallop.  Pulmonary:     Effort: Pulmonary effort is normal.     Breath sounds: No wheezing or rales.  Abdominal:     General: There is no distension.     Palpations: Abdomen is soft.     Tenderness: There is no abdominal tenderness.  Musculoskeletal:        General: Swelling and tenderness present.     Cervical back: Normal range of motion and neck supple.     Comments: Pain and swelling to the interphalangeal joints of the right thumb.  No scaphoid tenderness.  No pain to the wrist and the hand no pain at the wrist elbow or shoulder.  No obvious pain with palpation nor range of motion of the right knee.  No edema no signs of trauma.  Mild pain along the medial malleolus without any erythema edema.  Pulse motor and sensation intact to the right lower extremity.  Palpated from head to toe without any other noted areas of bony tenderness.  Skin:    General: Skin is warm and dry.  Neurological:     Mental Status: She is alert and oriented to person, place, and time.  Psychiatric:        Behavior: Behavior normal.    ED Results / Procedures / Treatments   Labs (all labs ordered are listed, but only abnormal results are displayed) Labs Reviewed - No data to display  EKG None  Radiology DG Ankle Complete Right  Result Date: 12/27/2020 CLINICAL DATA:  Medial right ankle pain after fall 2 days ago EXAM: RIGHT ANKLE - COMPLETE 3+ VIEW COMPARISON:  None. FINDINGS: There  is no evidence of fracture, dislocation, or joint effusion. There is no evidence of arthropathy or other focal bone abnormality. Soft tissues are unremarkable. IMPRESSION: Negative. Electronically Signed   By: Davina Poke D.O.   On: 12/27/2020 09:10   DG Knee Complete 4 Views Right  Result Date: 12/27/2020 CLINICAL DATA:  Medial right knee pain after fall 2 days ago EXAM: RIGHT KNEE - COMPLETE 4+ VIEW COMPARISON:  None. FINDINGS: No acute fracture. No dislocation. Mild-moderate medial compartment osteoarthritis with joint space narrowing and marginal osteophyte formation. Small knee joint effusion. No appreciable fat-fluid level. Mild soft tissue swelling at the medial aspect of the knee. IMPRESSION: 1. No acute osseous abnormality, right knee. 2. 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 3. Mild medial soft tissue swelling. Electronically Signed   By: Davina Poke D.O.   On: 12/27/2020 09:11   DG Finger Thumb Right  Result Date: 12/27/2020 CLINICAL DATA:  Right thumb pain and swelling after fall 2 days ago EXAM: RIGHT THUMB 2+V COMPARISON:  None. FINDINGS: No evidence of acute fracture or dislocation. Osteoarthritis of the thumb is most severe at the first [facility] joint. There is focal soft tissue swelling dorsal to the thumb IP joint. IMPRESSION: 1. No acute fracture or dislocation. 2. Focal soft tissue swelling dorsal to the thumb IP joint. 3. Severe osteoarthritis of the first [facility] joint. Electronically Signed   By:  Nicholas  Plundo D.O.   On: 12/27/2020 09:16    Procedures Procedures   Medications Ordered in ED Medications - No data to display  ED Course  I have reviewed the triage vital signs and the nursing notes.  Pertinent labs & imaging results that were available during my care of the patient were reviewed by me and considered in my medical decision making (see chart for details).    MDM Rules/Calculators/A&P                            65 yo F with a chief complaints of fall.  This was nonsyncopal by history.  Complaining mostly of right thumb pain.  Will obtain plain films reassess.  Plain film viewed by me negative for fracture.  Will place in a thumb splint.  Have her follow-up with hand.  Patient was questing follow-up for her right knee as well.  Given provider on call.  10:00 AM:  I have discussed the diagnosis/risks/treatment options with the patient and believe the pt to be eligible for discharge home to follow-up with Hand, ortho, PCP. We also discussed returning to the ED immediately if new or worsening sx occur. We discussed the sx which are most concerning (e.g., sudden worsening pain, fever, inability to tolerate by mouth) that necessitate immediate return. Medications administered to the patient during their visit and any new prescriptions provided to the patient are listed below.  Medications given during this visit Medications - No data to display   The patient appears reasonably screen and/or stabilized for discharge and I doubt any other medical condition or other [facility] requiring further screening, evaluation, or treatment in the ED at this time prior to discharge.   Final Clinical Impression(s) / ED Diagnoses Final diagnoses:  Other sprain of right thumb, initial encounter  Chronic pain of right knee  Acute right ankle pain    Rx / DC Orders ED Discharge Orders          Ordered    diclofenac Sodium (VOLTAREN) 1 % GEL  4 times daily        12/27/20 0922             Deno Etienne, DO 12/27/20 1000

## 2020-12-27 NOTE — Discharge Instructions (Signed)
There were no obvious broken bones on your x-rays.  Please use the splint on your thumb for comfort.  Follow-up with hand surgery in the office.  I have given you the information for the hand surgeon as well as the orthopedic doctor that is on-call if you want to see them in the office and have your knee evaluated as well.

## 2020-12-27 NOTE — Progress Notes (Signed)
   Covid-19 Vaccination Clinic  Name:  ESTEFANY GOEBEL    MRN: [id number] DOB: Jul 03, 1955  12/27/2020  Ms. Meester was observed post Covid-19 immunization for 15 minutes without incident. She was provided with Vaccine Information Sheet and instruction to access the V-Safe system.   Ms. Peery was instructed to call 911 with any severe reactions post vaccine: Difficulty breathing  Swelling of face and throat  A fast heartbeat  A bad rash all over body  Dizziness and weakness

## 2020-12-27 NOTE — ED Triage Notes (Signed)
Pt reports tripping on concrete two days prior and injuring R knee and R thumb. Denies head injury/LOC. Pt ambulatory to room for triage

## 2021-01-07 IMAGING — CT CT ABD-PELV W/ CM
2 of 5 series · 15 of 46 positions shown, 17 images · IV contrast (omnipaque)
Comparison: 08/24/2019

CLINICAL DATA: Suspect diverticulitis. Abdominal pain beginning
yesterday left-sided.

EXAM:
CT ABDOMEN AND PELVIS WITH CONTRAST
TECHNIQUE: Multidetector CT imaging of the abdomen and pelvis was performed
using the standard protocol following bolus administration of
intravenous contrast.
CONTRAST:  100mL OMNIPAQUE IOHEXOL 300 MG/ML  SOLN

[Series 2: axial st · axial · 0.68mm/px · z∈[-412,-62]mm · 12 of 81 slices shown, 14 images]
[im 6/81  soft-tissue]
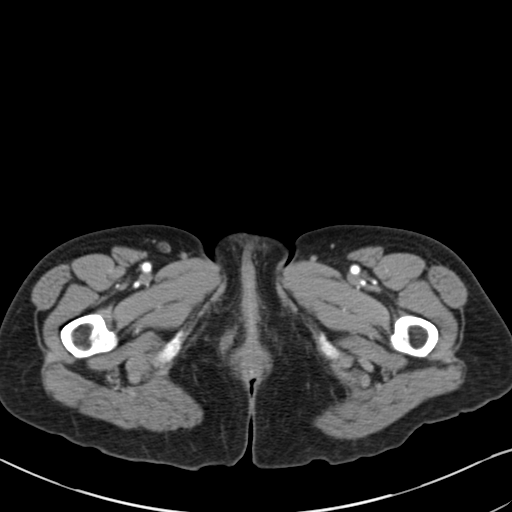
[im 6/81  bone]
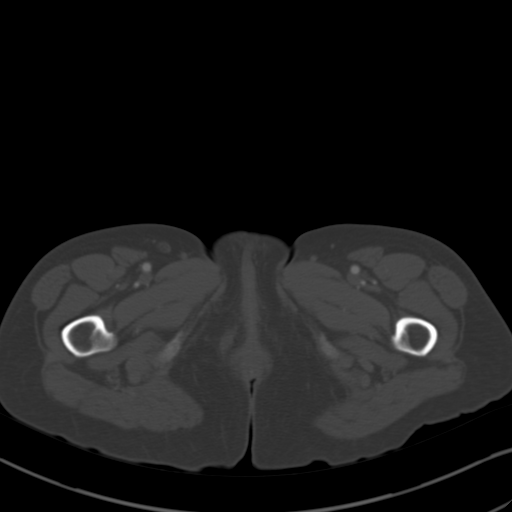
[im 11/81  soft-tissue]
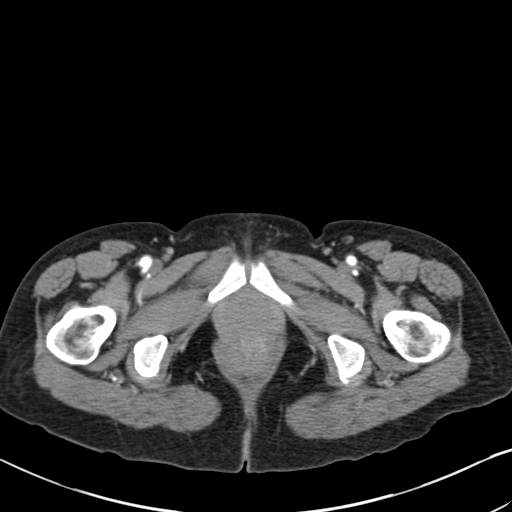
[im 21/81  soft-tissue]
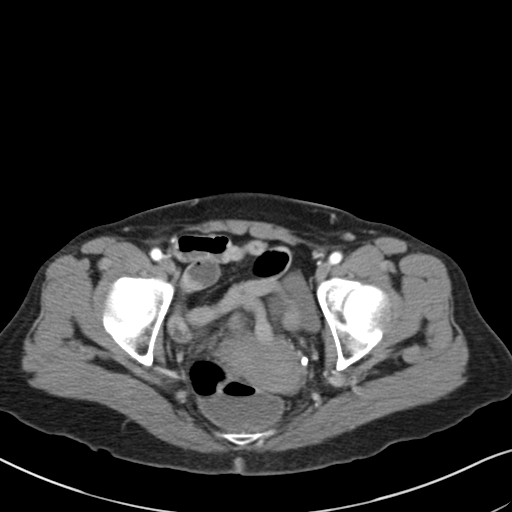
[im 26/81  soft-tissue]
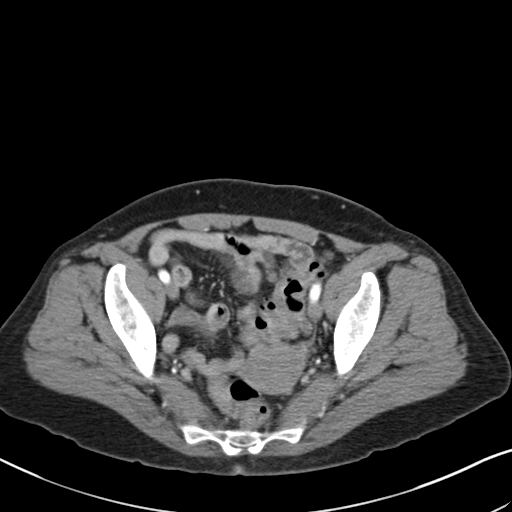
[im 31/81  soft-tissue]
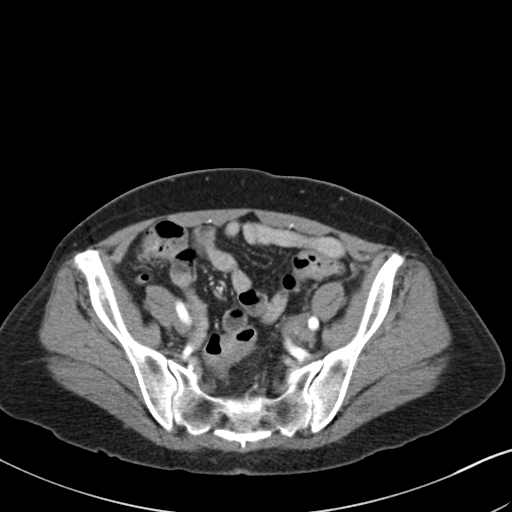
[im 36/81  soft-tissue]
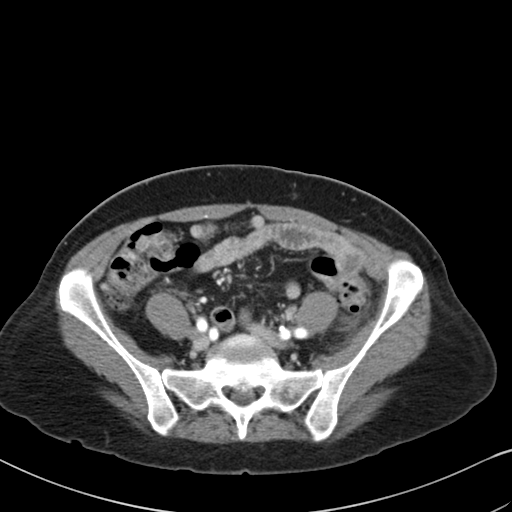
[im 46/81  soft-tissue]
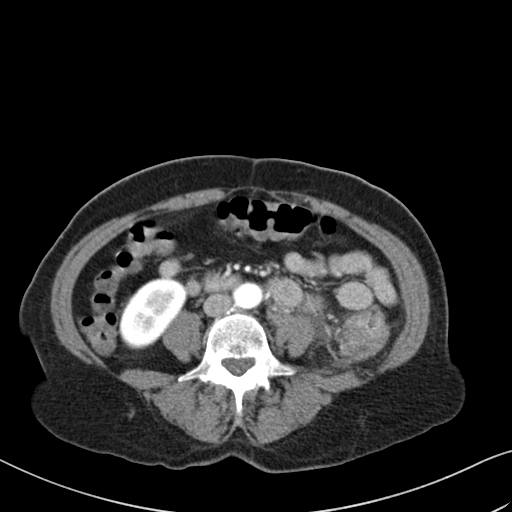
[im 51/81  soft-tissue]
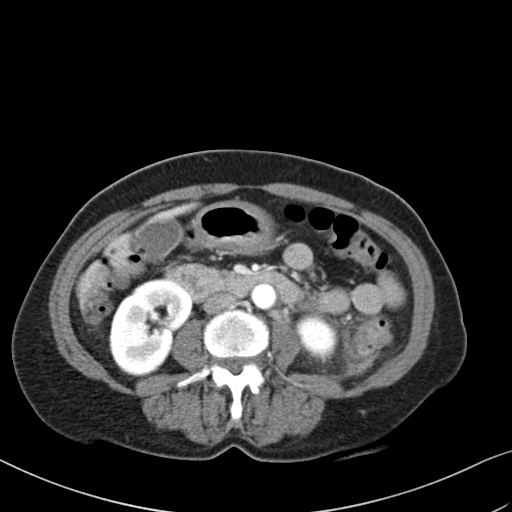
[im 56/81  soft-tissue]
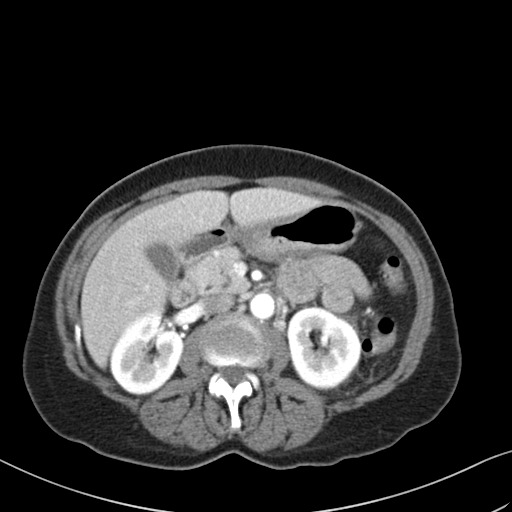
[im 56/81  bone]
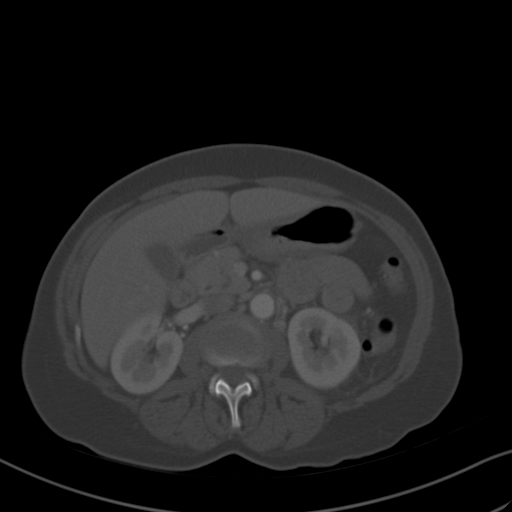
[im 61/81  soft-tissue]
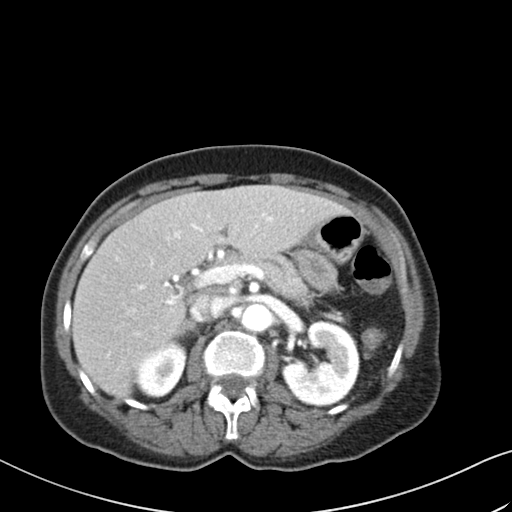
[im 71/81  soft-tissue]
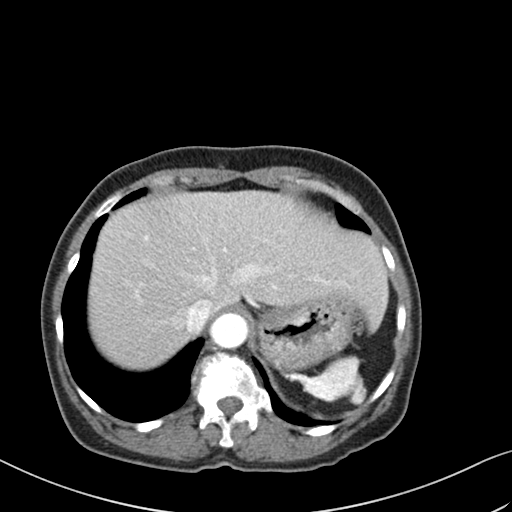
[im 76/81  soft-tissue]
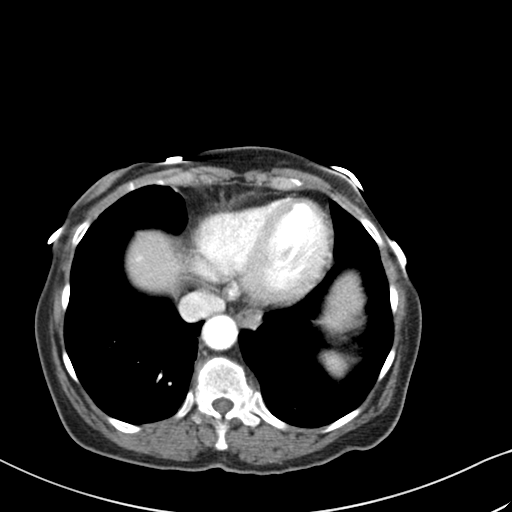

[Series 5: coronal st · coronal · 0.59mm/px · 3 of 113 slices shown]
[im 38/113  soft-tissue]
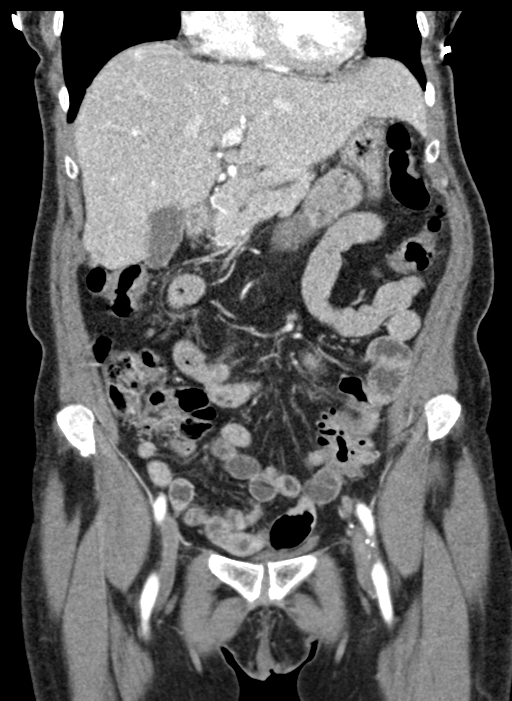
[im 50/113  soft-tissue]
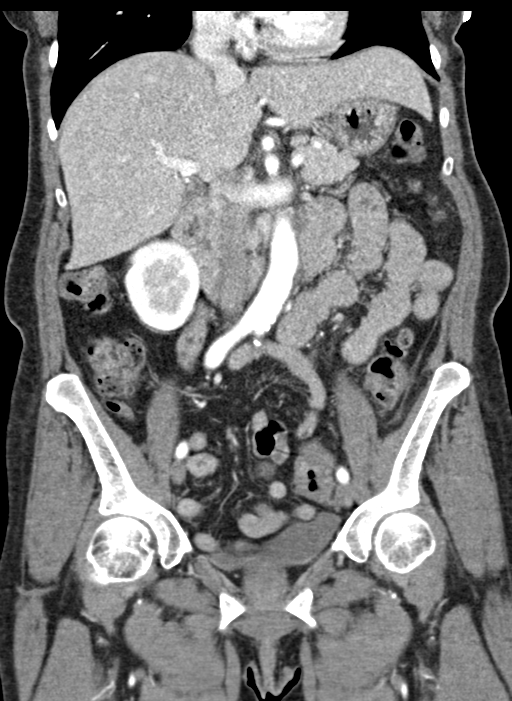
[im 63/113  soft-tissue]
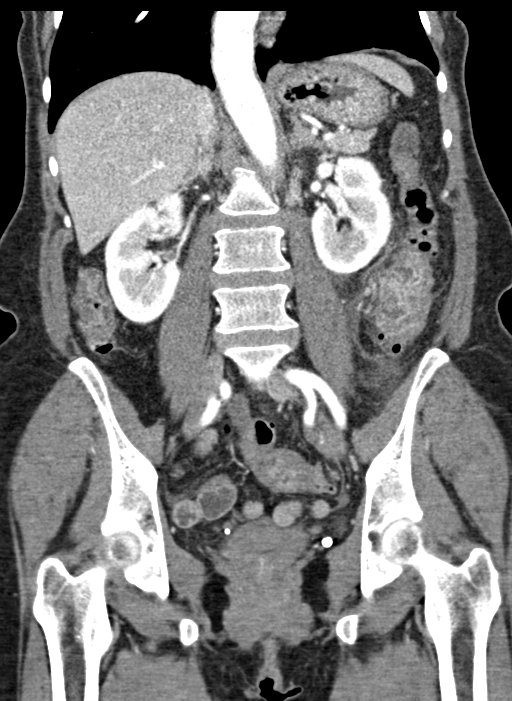

[15 of 46 positions shown; findings below may reference images not displayed]

FINDINGS: Lower chest: Lung bases are clear.

Hepatobiliary: Gallbladder, liver and biliary tree are normal.

Pancreas: Normal.

Spleen: Small in somewhat misshapen but on changed from previous
exams. Possible small hypodense mass present and unchanged likely
benign.

Adrenals/Urinary Tract: Adrenal glands are normal. Kidneys are
normal in size without hydronephrosis or nephrolithiasis.
Subcentimeter left lower pole renal cortical hypodensities too small
to characterize but likely a cyst and unchanged. Ureters and bladder
are normal.

Stomach/Bowel: Stomach is normal. Small bowel is unremarkable.
Appendix is normal. Diverticulosis of the colon. Interval
development of pericolonic inflammation and minimal free fluid
adjacent the mid to distal descending colon with associated wall
thickening. Findings are compatible with acute diverticulitis. There
is no evidence of diverticular abscess or perforation. At that these
findings are new compared to patient's recent previous exam. The
prior exam from 08/24/2019 with equivocal for acute inflammation
over the sigmoid colon.

Vascular/Lymphatic: Minimal plaque over the aortic bifurcation as
the aorta is normal in caliber. No significant adenopathy. Few small
periaortic lymph nodes likely reactive.

Reproductive: Normal.

Other: Multiple pelvic phleboliths.

Musculoskeletal: Moderate disc disease and endplate sclerosis at the
T11-12 level unchanged. Prominent Schmorl's node over the superior
endplate of L1 unchanged.
IMPRESSION: 1. Interval development of acute diverticulitis involving a short
segment of the mid to distal descending colon. No evidence of
perforation or diverticular abscess.

2. Subcentimeter left renal cortical hypodensity too small to
characterize but likely a cyst and unchanged.

3.  Aortic Atherosclerosis ([id number].B).

## 2021-01-15 ENCOUNTER — Other Ambulatory Visit (HOSPITAL_BASED_OUTPATIENT_CLINIC_OR_DEPARTMENT_OTHER): Payer: Self-pay

## 2021-01-15 MED ORDER — PFIZER COVID-19 VAC BIVALENT 30 MCG/0.3ML IM SUSP
INTRAMUSCULAR | 0 refills | Status: AC
  Filled 2021-01-15: qty 0.3, 1d supply, fill #0

## 2021-03-26 ENCOUNTER — Encounter (HOSPITAL_BASED_OUTPATIENT_CLINIC_OR_DEPARTMENT_OTHER): Payer: Self-pay | Admitting: *Deleted

## 2021-03-26 ENCOUNTER — Other Ambulatory Visit: Payer: Self-pay

## 2021-03-26 ENCOUNTER — Emergency Department (HOSPITAL_BASED_OUTPATIENT_CLINIC_OR_DEPARTMENT_OTHER)
Admission: EM | Admit: 2021-03-26 | Discharge: 2021-03-26 | Disposition: A | Discharge status: Home / Self Care | Payer: Medicare HMO | Attending: Emergency Medicine | Admitting: Emergency Medicine

## 2021-03-26 DIAGNOSIS — Z5321 Procedure and treatment not carried out due to patient leaving prior to being seen by health care provider: Secondary | ICD-10-CM | POA: Diagnosis not present

## 2021-03-26 DIAGNOSIS — R319 Hematuria, unspecified: Secondary | ICD-10-CM | POA: Insufficient documentation

## 2021-03-26 LAB — URINALYSIS, ROUTINE W REFLEX MICROSCOPIC
Bilirubin Urine: NEGATIVE
Glucose, UA: NEGATIVE mg/dL
Ketones, ur: NEGATIVE mg/dL
Nitrite: NEGATIVE
Protein, ur: >300 mg/dL — AB
Specific Gravity, Urine: >1.03 — ABNORMAL HIGH (ref 1.005–1.030)
pH: 6.5 (ref 5.0–8.0)

## 2021-03-26 LAB — URINALYSIS, MICROSCOPIC (REFLEX)

## 2021-03-26 NOTE — ED Triage Notes (Signed)
Blood in her urine since yesterday. Dysuria.

## 2022-11-13 ENCOUNTER — Ambulatory Visit
Admission: EM | Admit: 2022-11-13 | Discharge: 2022-11-13 | Disposition: A | Discharge status: Home / Self Care | Payer: Medicare HMO | Attending: Internal Medicine | Admitting: Internal Medicine

## 2022-11-13 ENCOUNTER — Other Ambulatory Visit: Payer: Self-pay

## 2022-11-13 DIAGNOSIS — J40 Bronchitis, not specified as acute or chronic: Secondary | ICD-10-CM | POA: Diagnosis present

## 2022-11-13 DIAGNOSIS — J329 Chronic sinusitis, unspecified: Secondary | ICD-10-CM | POA: Diagnosis not present

## 2022-11-13 DIAGNOSIS — Z1152 Encounter for screening for COVID-19: Secondary | ICD-10-CM | POA: Insufficient documentation

## 2022-11-13 DIAGNOSIS — R051 Acute cough: Secondary | ICD-10-CM | POA: Diagnosis present

## 2022-11-13 MED ORDER — AMOXICILLIN-POT CLAVULANATE 875-125 MG PO TABS
1.0000 | ORAL_TABLET | Freq: Two times a day (BID) | ORAL | 0 refills | Status: AC
Start: 2022-11-13 — End: 2022-11-23

## 2022-11-13 MED ORDER — BENZONATATE 200 MG PO CAPS
200.0000 mg | ORAL_CAPSULE | Freq: Three times a day (TID) | ORAL | 0 refills | Status: AC | PRN
Start: 2022-11-13 — End: ?

## 2022-11-13 MED ORDER — PREDNISONE 20 MG PO TABS
40.0000 mg | ORAL_TABLET | Freq: Every day | ORAL | 0 refills | Status: AC
Start: 2022-11-13 — End: 2022-11-18

## 2022-11-13 NOTE — ED Provider Notes (Signed)
[facility]    CSN: [id number] Arrival date & time: 11/13/22  1641      History   Chief Complaint Chief Complaint  Patient presents with   Headache    HPI Charlotte Garcia is a 67 y.o. female  presents for evaluation of URI symptoms for 8 days. Patient reports associated symptoms of pressure/pain with congestion, postnasal drip, sinus headache, cough. Denies N/V/D, fevers, ear pain, sore throat, body aches, shortness of breath. Patient does not have a hx of asthma or smoking. No known sick contacts.  States she tested positive for COVID 1 week ago.  Reports this is the fourth time she has had COVID.  Pt has taken cough but has not OTC for symptoms. Pt has no other concerns at this time.    Headache Associated symptoms: congestion, cough and sinus pressure     Past Medical History:  Diagnosis Date   Anemia    Arthritis    Hands and generalized   Chest discomfort 09/17/2017   per pt weight on chest:   cardiologist consult w/ dr berry 09-23-2017  normal nuclear stress test    Diverticulosis    Endometrial polyp    Exposure to TB    GERD (gastroesophageal reflux disease)    Headache    History of diverticulitis of colon    recurrent 2014; 2015; x2 in 2017   History of ovarian cyst    Hypertension    Murmur, heart    Tobacco abuse    Uterine fibroid     Patient Active Problem List   Diagnosis Date Noted   Diverticulitis large intestine 09/20/2019   Diverticulitis large intestine w/o perforation or abscess w/o bleeding 09/19/2019   Family history of coronary artery disease in father 12/22/2017   Endometrial polyp 10/06/2017    Class: Present on Admission   Atypical chest pain 11/06/2013   Hypokalemia 11/06/2013   Acute diverticulitis 02/27/2013   Essential hypertension    Tobacco abuse     Past Surgical History:  Procedure Laterality Date   CARDIOVASCULAR STRESS TEST  09-26-2017   dr berry   Low risk nuclear study w/ no ischemia/  normal LV function  and wall motion ,  nuclear stress ef 56%   COLONOSCOPY     CYSTECTOMY     forehead; back of head   DILATATION & CURETTAGE/HYSTEROSCOPY WITH MYOSURE N/A 10/06/2017   Procedure: DILATATION & CURETTAGE/HYSTEROSCOPY WITH MYOSURE;  Surgeon: Richardean Chimera, MD;  Location: [facility];  Service: Gynecology;  Laterality: N/A;   DILATION AND CURETTAGE OF UTERUS     TRANSTHORACIC ECHOCARDIOGRAM  09/08/2015   mild focal basal hypertrophy,  ef 60-65%/  trivial MR   WRIST SURGERY     ganglion cyst    OB History     Gravida  3   Para  1   Term  1   Preterm      AB  2   Living  1      SAB  2   IAB      Ectopic      Multiple      Live Births               Home Medications    Prior to Admission medications   Medication Sig Start Date End Date Taking? Authorizing Provider  amoxicillin-clavulanate (AUGMENTIN) 875-125 MG tablet Take 1 tablet by mouth every 12 (twelve) hours for 10 days. 11/13/22 11/23/22 Yes Radford Pax, NP  benzonatate (TESSALON) 200 MG capsule Take 1 capsule (200 mg total) by mouth 3 (three) times daily as needed. 11/13/22  Yes Radford Pax, NP  predniSONE (DELTASONE) 20 MG tablet Take 2 tablets (40 mg total) by mouth daily with breakfast for 5 days. 11/13/22 11/18/22 Yes Radford Pax, NP  aspirin EC 81 MG tablet Take 81 mg by mouth daily.     [provider]  COVID-19 mRNA bivalent vaccine, Pfizer, (PFIZER COVID-19 VAC BIVALENT) injection Inject into the muscle. 12/27/20   Judyann Munson, MD  diclofenac Sodium (VOLTAREN) 1 % GEL Apply 4 g topically 4 (four) times daily. 12/27/20   Melene Plan, DO  diphenhydramine-acetaminophen (TYLENOL PM) 25-500 MG TABS tablet Take 2 tablets by mouth at bedtime as needed (pain).    [provider]  HYDROcodone-acetaminophen (NORCO/VICODIN) 5-325 MG tablet Take 1 tablet by mouth every 6 (six) hours as needed. 10/23/20   Gwyneth Sprout, MD  lisinopril (ZESTRIL) 5 MG tablet Take 1 tablet (5 mg total)  by mouth daily. 10/23/20   Gwyneth Sprout, MD  Multiple Vitamins-Minerals (MULTIVITAMIN ADULT) CHEW Chew 1 tablet by mouth daily.    [provider]  omeprazole (PRILOSEC) 20 MG capsule Take 1 capsule (20 mg total) by mouth 2 (two) times daily before a meal. 09/22/19 10/22/19  Lanae Boast, MD  ondansetron (ZOFRAN ODT) 4 MG disintegrating tablet 4mg  ODT q4 hours prn nausea/vomit Patient taking differently: Take 4 mg by mouth every 4 (four) hours as needed for nausea or vomiting.  10/06/18   Dartha Lodge, PA-C  ondansetron (ZOFRAN) 4 MG tablet Take 1 tablet (4 mg total) by mouth every 6 (six) hours. 10/23/20   Gwyneth Sprout, MD  traMADol (ULTRAM) 50 MG tablet Take 1 tablet (50 mg total) by mouth every 6 (six) hours as needed for up to 20 doses for moderate pain. 09/22/19   Lanae Boast, MD    Family History Family History  Problem Relation Age of Onset   Renal Disease Brother    Hypertension Father    Heart disease Father        MI in his 67's   Hypertension Mother    Heart disease Mother     Social History Social History   Tobacco Use   Smoking status: Some Days    Current packs/day: 0.25    Average packs/day: 0.3 packs/day for 25.0 years (6.3 ttl pk-yrs)    Types: Cigarettes   Smokeless tobacco: Never  Vaping Use   Vaping status: Former  Substance Use Topics   Alcohol use: Yes    Comment: 1-2 glasses of wine 3 times a week   Drug use: Yes    Types: Marijuana    Comment: 3 weeks ago as of 09/17/2017     Allergies   Sulfa antibiotics, Codeine, and Morphine and codeine   Review of Systems Review of Systems  HENT:  Positive for congestion, sinus pressure and sinus pain.   Respiratory:  Positive for cough.   Neurological:  Positive for headaches.     Physical Exam Triage Vital Signs ED Triage Vitals  Encounter Vitals Group     BP 11/13/22 1733 (!) 164/83     Systolic BP Percentile --      Diastolic BP Percentile --      Pulse Rate 11/13/22 1733 68     Resp  11/13/22 1733 18     Temp 11/13/22 1733 99 F (37.2 C)     Temp Source 11/13/22 1733 Oral  SpO2 11/13/22 1733 96 %     Weight --      Height --      Head Circumference --      Peak Flow --      Pain Score 11/13/22 1730 4     Pain Loc --      Pain Education --      Exclude from Growth Chart --    No data found.  Updated Vital Signs BP (!) 164/83 (BP Location: Right Arm)   Pulse 68   Temp 99 F (37.2 C) (Oral)   Resp 18   SpO2 96%   Visual Acuity Right Eye Distance:   Left Eye Distance:   Bilateral Distance:    Right Eye Near:   Left Eye Near:    Bilateral Near:     Physical Exam Vitals and nursing note reviewed.  Constitutional:      General: She is not in acute distress.    Appearance: She is well-developed. She is not ill-appearing.  HENT:     Head: Normocephalic and atraumatic.     Right Ear: Tympanic membrane and ear canal normal.     Left Ear: Tympanic membrane and ear canal normal.     Nose: Congestion present.     Right Turbinates: Swollen and pale.     Left Turbinates: Swollen and pale.     Right Sinus: Frontal sinus tenderness present. No maxillary sinus tenderness.     Left Sinus: Frontal sinus tenderness present. No maxillary sinus tenderness.     Mouth/Throat:     Mouth: Mucous membranes are moist.     Pharynx: Oropharynx is clear. Uvula midline. No oropharyngeal exudate or posterior oropharyngeal erythema.     Tonsils: No tonsillar exudate or tonsillar abscesses.  Eyes:     Conjunctiva/sclera: Conjunctivae normal.     Pupils: Pupils are equal, round, and reactive to light.  Cardiovascular:     Rate and Rhythm: Normal rate and regular rhythm.     Heart sounds: Normal heart sounds.  Pulmonary:     Effort: Pulmonary effort is normal.     Breath sounds: Normal breath sounds.  Musculoskeletal:     Cervical back: Normal range of motion and neck supple.  Lymphadenopathy:     Cervical: No cervical adenopathy.  Skin:    General: Skin is warm and  dry.  Neurological:     General: No focal deficit present.     Mental Status: She is alert and oriented to person, place, and time.  Psychiatric:        Mood and Affect: Mood normal.        Behavior: Behavior normal.      UC Treatments / Results  Labs (all labs ordered are listed, but only abnormal results are displayed) Labs Reviewed  SARS CORONAVIRUS 2 (TAT 6-24 HRS)    EKG   Radiology No results found.  Procedures Procedures (including critical care time)  Medications Ordered in UC Medications - No data to display  Initial Impression / Assessment and Plan / UC Course  I have reviewed the triage vital signs and the nursing notes.  Pertinent labs & imaging results that were available during my care of the patient were reviewed by me and considered in my medical decision making (see chart for details).     Reviewed exam and symptoms with patient.  No red flags.  Patient request COVID testing to see if she test negative so she can return to work.  Will  contact if it is positive.  Discussed sinobronchitis.  Start Augmentin, prednisone, Tessalon.  PCP follow-up 2 days for recheck.  ER precautions reviewed and patient verbalized understanding. Final Clinical Impressions(s) / UC Diagnoses   Final diagnoses:  Sinobronchitis  Acute cough     Discharge Instructions      Clinical contact you with results of the COVID test if it is positive.  Start Augmentin twice daily for 10 days.  Tessalon as needed for cough.  Prednisone daily for 5 days.  Lots of rest and fluids.  Please follow-up with your PCP if your symptoms do not improve.  Please go to the emergency room for any worsening symptoms.  I hope you feel better soon!    ED Prescriptions     Medication Sig Dispense Auth. Provider   amoxicillin-clavulanate (AUGMENTIN) 875-125 MG tablet Take 1 tablet by mouth every 12 (twelve) hours for 10 days. 20 tablet Radford Pax, NP   benzonatate (TESSALON) 200 MG capsule Take 1  capsule (200 mg total) by mouth 3 (three) times daily as needed. 20 capsule Radford Pax, NP   predniSONE (DELTASONE) 20 MG tablet Take 2 tablets (40 mg total) by mouth daily with breakfast for 5 days. 10 tablet Radford Pax, NP      PDMP not reviewed this encounter.   Radford Pax, NP 11/13/22 [phone]

## 2022-11-13 NOTE — ED Triage Notes (Signed)
Pt presents with c/o persistent headaches, runny nose and runny eyes. Pt states she tested positive for COVID 1 week ago.

## 2022-11-13 NOTE — Discharge Instructions (Signed)
Clinical contact you with results of the COVID test if it is positive.  Start Augmentin twice daily for 10 days.  Tessalon as needed for cough.  Prednisone daily for 5 days.  Lots of rest and fluids.  Please follow-up with your PCP if your symptoms do not improve.  Please go to the emergency room for any worsening symptoms.  I hope you feel better soon!

## 2022-11-14 LAB — SARS CORONAVIRUS 2 (TAT 6-24 HRS): SARS Coronavirus 2: NEGATIVE

## 2023-08-06 ENCOUNTER — Other Ambulatory Visit (HOSPITAL_COMMUNITY): Payer: Self-pay
